# Patient Record
Sex: Male | Born: 1965 | Race: Black or African American | Hispanic: No | Marital: Single | Smoking: Former smoker
Health system: Southern US, Community
[De-identification: ages and names within clinical notes are randomized; demographics above are authoritative.]

## PROBLEM LIST (undated history)

## (undated) DIAGNOSIS — K219 Gastro-esophageal reflux disease without esophagitis: Secondary | ICD-10-CM

## (undated) DIAGNOSIS — M1990 Unspecified osteoarthritis, unspecified site: Secondary | ICD-10-CM

## (undated) DIAGNOSIS — A630 Anogenital (venereal) warts: Secondary | ICD-10-CM

## (undated) DIAGNOSIS — A159 Respiratory tuberculosis unspecified: Secondary | ICD-10-CM

## (undated) DIAGNOSIS — R202 Paresthesia of skin: Secondary | ICD-10-CM

## (undated) DIAGNOSIS — E785 Hyperlipidemia, unspecified: Secondary | ICD-10-CM

## (undated) DIAGNOSIS — I639 Cerebral infarction, unspecified: Secondary | ICD-10-CM

## (undated) DIAGNOSIS — I10 Essential (primary) hypertension: Secondary | ICD-10-CM

## (undated) DIAGNOSIS — I739 Peripheral vascular disease, unspecified: Secondary | ICD-10-CM

## (undated) DIAGNOSIS — R51 Headache: Secondary | ICD-10-CM

## (undated) DIAGNOSIS — R519 Headache, unspecified: Secondary | ICD-10-CM

## (undated) DIAGNOSIS — F17200 Nicotine dependence, unspecified, uncomplicated: Secondary | ICD-10-CM

## (undated) HISTORY — PX: UPPER GASTROINTESTINAL ENDOSCOPY: SHX188

## (undated) HISTORY — DX: Hyperlipidemia, unspecified: E78.5

## (undated) HISTORY — PX: COLONOSCOPY: SHX174

## (undated) HISTORY — DX: Nicotine dependence, unspecified, uncomplicated: F17.200

## (undated) HISTORY — DX: Essential (primary) hypertension: I10

## (undated) HISTORY — DX: Respiratory tuberculosis unspecified: A15.9

---

## 1993-07-19 DIAGNOSIS — A159 Respiratory tuberculosis unspecified: Secondary | ICD-10-CM

## 1993-07-19 HISTORY — DX: Respiratory tuberculosis unspecified: A15.9

## 2014-01-29 ENCOUNTER — Encounter: Payer: Self-pay | Admitting: Medical

## 2014-01-29 ENCOUNTER — Ambulatory Visit (INDEPENDENT_AMBULATORY_CARE_PROVIDER_SITE_OTHER): Payer: Managed Care, Other (non HMO) | Admitting: Medical

## 2014-01-29 ENCOUNTER — Telehealth: Payer: Self-pay | Admitting: Medical

## 2014-01-29 VITALS — BP 180/110 | HR 62 | Temp 98.3°F | Resp 12 | Ht 70.0 in | Wt 184.0 lb

## 2014-01-29 DIAGNOSIS — K4090 Unilateral inguinal hernia, without obstruction or gangrene, not specified as recurrent: Secondary | ICD-10-CM

## 2014-01-29 DIAGNOSIS — IMO0002 Reserved for concepts with insufficient information to code with codable children: Secondary | ICD-10-CM

## 2014-01-29 DIAGNOSIS — F17200 Nicotine dependence, unspecified, uncomplicated: Secondary | ICD-10-CM

## 2014-01-29 DIAGNOSIS — I10 Essential (primary) hypertension: Secondary | ICD-10-CM

## 2014-01-29 MED ORDER — LISINOPRIL-HYDROCHLOROTHIAZIDE 20-25 MG PO TABS: 1.0000 | ORAL_TABLET | Freq: Every day | ORAL | Status: DC

## 2014-01-29 NOTE — Patient Instructions (Signed)
Thank you for giving me the opportunity to serve you today.    Your diagnosis today includes: Encounter Diagnoses  Name Primary?  . Essential hypertension, benign Yes  . Tobacco use disorder   . Left inguinal hernia   . Leg strain      Specific recommendations today include:  Begin lisinopril HCT high blood pressure medication 1 tablet daily in the morning  I strongly recommend you find a way to quit tobacco.  I would recommend we start Wellbutrin medication to help decrease the appetite for tobacco  We will refer you to general surgery for left inguinal hernia  Regarding the leg muscle strain, try and not lift anything too heavy or do a lot of squatting for the next week or so  He may use over-the-counter ibuprofen or Aleve for pain and inflammation  He may use heat pad to the leg for the next few days   Recheck in 3-4 weeks for a physical    I have included other useful information below for your review.  YOU CAN QUIT SMOKING!  Talk to your medical provider about using medicines to help you quit. These include nicotine replacement gum, lozenges, or skin patches.  Consider calling 1-800-QUIT-NOW, a toll free 24/7 hotline with free counseling to help you quit.  If you are ready to quit smoking or are thinking about it, congratulations! You have chosen to help yourself be healthier and live longer! There are lots of different ways to quit smoking. Nicotine gum, nicotine patches, a nicotine inhaler, or nicotine nasal spray can help with physical craving. Hypnosis, support groups, and medicines help break the habit of smoking. TIPS TO GET OFF AND STAY OFF CIGARETTES  Learn to predict your moods. Do not let a bad situation be your excuse to have a cigarette. Some situations in your life might tempt you to have a cigarette.   Ask friends and co-workers not to smoke around you.   Make your home smoke-free.   Never have "just one" cigarette. It leads to wanting another and  another. Remind yourself of your decision to quit.   On a card, make a list of your reasons for not smoking. Read it at least the same number of times a day as you have a cigarette. Tell yourself everyday, "I do not want to smoke. I choose not to smoke."   Ask someone at home or work to help you with your plan to quit smoking.   Have something planned after you eat or have a cup of coffee. Take a walk or get other exercise to perk you up. This will help to keep you from overeating.   Try a relaxation exercise to calm you down and decrease your stress. Remember, you may be tense and nervous the first two weeks after you quit. This will pass.   Find new activities to keep your hands busy. Play with a pen, coin, or rubber band. Doodle or draw things on paper.   Brush your teeth right after eating. This will help cut down the craving for the taste of tobacco after meals. You can try mouthwash too.   Try gum, breath mints, or diet candy to keep something in your mouth.  IF YOU SMOKE AND WANT TO QUIT:  Do not stock up on cigarettes. Never buy a carton. Wait until one pack is finished before you buy another.   Never carry cigarettes with you at work or at home.   Keep cigarettes as far away  from you as possible. Leave them with someone else.   Never carry matches or a lighter with you.   Ask yourself, "Do I need this cigarette or is this just a reflex?"   Bet with someone that you can quit. Put cigarette money in a piggy bank every morning. If you smoke, you give up the money. If you do not smoke, by the end of the week, you keep the money.   Keep trying. It takes 21 days to change a habit!  Document Released: 05/01/2009 Document Revised: 03/17/2011 Document Reviewed: 05/01/2009 [facility] Patient Information 2012 [place].

## 2014-01-29 NOTE — Telephone Encounter (Signed)
Refer to general surgery for left inguinal hernia

## 2014-01-29 NOTE — Telephone Encounter (Signed)
Pt scheduled to see Dr. Rosendo Gros at [facility] on 02/18/14 at 3:15

## 2014-01-29 NOTE — Progress Notes (Addendum)
   Subjective:    Shane Valentine is a 48 y.o. male presenting on 01/29/2014 with hernia pain  Here as a new patient today. Last medical care 7-8 years ago.  Thinks he has a hernia, left inguinal.   Has a bulge and pain in left groin x 2 wk.  Date of injury assumed to be 01/15/14.  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  Hurts mostly when bending or turning a certain way.  No prior hernia.  Denies any specific injury of recent.  HTN - last checked 80mo, high to borderline.  Diagnosed with HTN over 10 years ago.  Last medication - can't recall the name, but been years since HTN medications.  Similar was on Nexium in the past for GERD.  This flares up from time to time.  Uses Tums.   Smoker - 1ppd x 23 years.    Had TB in 1995, treated x 6 mo of therapy through health dept.   No other aggravating or relieving factors.  No other complaint.  Review of Systems ROS as in subjective      Objective:    BP 180/110  Pulse 62  Temp(Src) 98.3 F (36.8 C)  Resp 12  Ht 5\' 10"  (1.778 m)  Wt 184 lb (83.462 kg)  BMI 26.40 kg/m2  General appearance: alert, no distress, WD/WN, AA male Neck: supple, no lymphadenopathy, no thyromegaly, no masses, no bruits Heart: RRR, normal S1, S2, no murmurs Lungs: CTA bilaterally, no wheezes, rhonchi, or rales Abdomen: +bs, soft, non tender, non distended, no masses, no hepatomegaly, no splenomegaly Pulses: 2+ symmetric, upper and lower extremities, normal cap refill Ext: no edema GU: Tender bulge in the left inguinal area, suggestive of small direct but tender inguinal hernia, worse bulge with Valsalva. Otherwise no obvious hernia or mass otherwise circumcised, normal male genitalia Musculoskeletal: Mild tenderness to left medial upper thigh, mild pain with left leg abduction otherwise lower extremity nontender normal range of motion     Assessment: Encounter  Diagnoses  Name Primary?  . Essential hypertension, benign Yes  . Tobacco use disorder   . Left inguinal hernia   . Leg strain      Plan: Hypertension-diagnosed 10 years ago, no medication for years, discussed risk of high blood pressure, begin back on medication, lisinopril HCT. Discussed risk and benefits of medication recheck in 2-4 weeks  Tobacco use disorder-discussed risk of tobacco, advise he quit tobacco, he is trying to cutback, has used patches some.  He will consider Wellbutrin  Left inguinal hernia-symptomatic, referral to general surgery  Leg strain-discuss diagnosis symptoms and treatment, followup if not improving    Shane Valentine was seen today for hernia pain.  Diagnoses and associated orders for this visit:  Essential hypertension, benign  Tobacco use disorder  Left inguinal hernia  Leg strain  Other Orders - lisinopril-hydrochlorothiazide (PRINZIDE,ZESTORETIC) 20-25 MG per tablet; Take 1 tablet by mouth daily.    Return in about 1 month (around 03/01/2014), or for CPX.

## 2014-02-13 ENCOUNTER — Telehealth: Payer: Self-pay | Admitting: Internal Medicine

## 2014-02-13 NOTE — Telephone Encounter (Signed)
Faxed over medical records to [facility] [place] [facility] @ [phone]

## 2014-02-18 ENCOUNTER — Encounter (INDEPENDENT_AMBULATORY_CARE_PROVIDER_SITE_OTHER): Payer: Self-pay | Admitting: General Surgery

## 2014-02-18 ENCOUNTER — Ambulatory Visit (INDEPENDENT_AMBULATORY_CARE_PROVIDER_SITE_OTHER): Payer: Managed Care, Other (non HMO) | Admitting: General Surgery

## 2014-02-18 VITALS — BP 154/100 | HR 50 | Temp 97.9°F | Resp 16 | Ht 70.0 in | Wt 178.4 lb

## 2014-02-18 DIAGNOSIS — K4090 Unilateral inguinal hernia, without obstruction or gangrene, not specified as recurrent: Secondary | ICD-10-CM

## 2014-02-18 NOTE — Progress Notes (Signed)
Patient ID: Shane Valentine, male   DOB: [birth year], 48 y.o.   MRN: [id number]  No chief complaint on file.   HPI Shane Valentine is a 48 y.o. male.  The patient is a 48 year old male who is referred by Dr. Glade Valentine  For evaluation of a left inguinal hernia. The patient states that this arose approximately 1-2 months ago. He states he has pain in the left inguinal area. He states he tried returning to work however secondary to pain could not tolerate the pain. The patient works as a [employer] social research officer. HPI  No past medical history on file.  No past surgical history on file.  No family history on file.  Social History History  Substance Use Topics  . Smoking status: Current Every Day Smoker  . Smokeless tobacco: Not on file  . Alcohol Use: Not on file    No Known Allergies  Current Outpatient Prescriptions  Medication Sig Dispense Refill  . lisinopril-hydrochlorothiazide (PRINZIDE,ZESTORETIC) 20-25 MG per tablet Take 1 tablet by mouth daily.  30 tablet  2   No current facility-administered medications for this visit.    Review of Systems Review of Systems  Constitutional: Negative.   HENT: Negative.   Eyes: Negative.   Respiratory: Negative.   Cardiovascular: Negative.   Gastrointestinal: Negative.   Endocrine: Negative.   Neurological: Negative.     Blood pressure 154/100, pulse 50, temperature 97.9 F (36.6 C), temperature source Temporal, resp. rate 16, height 5\' 10"  (1.778 m), weight 178 lb 6.4 oz (80.922 kg).  Physical Exam Physical Exam  Constitutional: He is oriented to person, place, and time. He appears well-developed and well-nourished.  HENT:  Head: Normocephalic and atraumatic.  Eyes: Conjunctivae and EOM are normal. Pupils are equal, round, and reactive to light.  Neck: Normal range of motion. Neck supple.  Cardiovascular: Normal rate, regular rhythm and normal heart sounds.   Pulmonary/Chest: Effort normal and breath sounds normal.  Abdominal: Bowel sounds are  normal. A hernia is present. Hernia confirmed positive in the left inguinal area. Hernia confirmed negative in the right inguinal area.  Musculoskeletal: Normal range of motion.  Neurological: He is alert and oriented to person, place, and time.  Skin: Skin is warm and dry.    Data Reviewed none  Assessment    48 year old male with a left likely direct inguinal hernia     Plan    1. The patient like to proceed to the operating room for laparoscopic left inguinal hernia inguinal hernia repair with Mesh. 2.All risks and benefits were discussed with the patient, to generally include infection, bleeding, damage to surrounding structures, acute and chronic nerve pain, and recurrence. Alternatives were offered and described.  All questions were answered and the patient voiced understanding of the procedure and wishes to proceed at this point.         Rosario Jacks., Guy Toney 02/18/2014, 3:33 PM

## 2014-02-20 ENCOUNTER — Encounter: Payer: Self-pay | Admitting: Medical

## 2014-02-20 ENCOUNTER — Telehealth: Payer: Self-pay | Admitting: Medical

## 2014-02-20 ENCOUNTER — Ambulatory Visit (INDEPENDENT_AMBULATORY_CARE_PROVIDER_SITE_OTHER): Payer: Managed Care, Other (non HMO) | Admitting: Medical

## 2014-02-20 ENCOUNTER — Other Ambulatory Visit: Payer: Self-pay | Admitting: Medical

## 2014-02-20 VITALS — BP 160/100 | HR 88 | Temp 98.5°F | Resp 16 | Wt 177.0 lb

## 2014-02-20 DIAGNOSIS — L259 Unspecified contact dermatitis, unspecified cause: Secondary | ICD-10-CM

## 2014-02-20 DIAGNOSIS — I10 Essential (primary) hypertension: Secondary | ICD-10-CM

## 2014-02-20 DIAGNOSIS — M6740 Ganglion, unspecified site: Secondary | ICD-10-CM

## 2014-02-20 DIAGNOSIS — R9431 Abnormal electrocardiogram [ECG] [EKG]: Secondary | ICD-10-CM

## 2014-02-20 DIAGNOSIS — F17200 Nicotine dependence, unspecified, uncomplicated: Secondary | ICD-10-CM

## 2014-02-20 LAB — CBC
HEMATOCRIT: 40.6 % (ref 39.0–52.0)
HEMOGLOBIN: 13.9 g/dL (ref 13.0–17.0)
MCH: 31.6 pg (ref 26.0–34.0)
MCHC: 34.2 g/dL (ref 30.0–36.0)
MCV: 92.3 fL (ref 78.0–100.0)
Platelets: 199 10*3/uL (ref 150–400)
RBC: 4.4 MIL/uL (ref 4.22–5.81)
RDW: 13.4 % (ref 11.5–15.5)
WBC: 9.6 10*3/uL (ref 4.0–10.5)

## 2014-02-20 LAB — COMPREHENSIVE METABOLIC PANEL
ALBUMIN: 4.2 g/dL (ref 3.5–5.2)
ALT: 12 U/L (ref 0–53)
AST: 13 U/L (ref 0–37)
Alkaline Phosphatase: 81 U/L (ref 39–117)
BUN: 15 mg/dL (ref 6–23)
CALCIUM: 8.9 mg/dL (ref 8.4–10.5)
CHLORIDE: 106 meq/L (ref 96–112)
CO2: 25 mEq/L (ref 19–32)
Creat: 1.17 mg/dL (ref 0.50–1.35)
GLUCOSE: 98 mg/dL (ref 70–99)
POTASSIUM: 3.7 meq/L (ref 3.5–5.3)
Sodium: 141 mEq/L (ref 135–145)
Total Bilirubin: 0.4 mg/dL (ref 0.2–1.2)
Total Protein: 6.5 g/dL (ref 6.0–8.3)

## 2014-02-20 LAB — TSH: TSH: 0.352 u[IU]/mL (ref 0.350–4.500)

## 2014-02-20 MED ORDER — NICOTINE 21 MG/24HR TD PT24: 21.0000 mg | MEDICATED_PATCH | Freq: Every day | TRANSDERMAL | Status: DC

## 2014-02-20 NOTE — Telephone Encounter (Signed)
Refer to Dr. Wynonia Lawman for abnormal EKG, eval, HTN, and he prefers ASAP.  He is having hernia surgery soon

## 2014-02-20 NOTE — Progress Notes (Signed)
Subjective:    Shane Valentine is a 48 y.o. male presenting on 02/20/2014 with Follow-up  I last saw him 01/29/14 as a new patient.  At that time we started him back on BP medication Lisinopril HCT.  He is compliant with medication, been on this 3 wk now.  Prior to me seeing him in July, he  last checked BP 37mo earlier which was high to borderline.  Diagnosed with HTN over 10 years ago.  Last medication - can't recall the name, but been years since HTN medications until July.   Smoker - 1ppd x 23 years.    He has small area of poison ivy exposure right elbow  He has a bump at the base of the right thumb to look at.   Review of Systems ROS as in subjective      Objective:    BP 160/100  Pulse 88  Temp(Src) 98.5 F (36.9 C) (Tympanic)  Resp 16  Wt 177 lb (80.287 kg)  General appearance: alert, no distress, WD/WN, AA male Neck: supple, no lymphadenopathy, no thyromegaly, no masses, no bruits Heart: RRR, normal S1, S2, no murmurs Lungs: CTA bilaterally, no wheezes, rhonchi, or rales Abdomen: +bs, soft, non tender, non distended, no masses, no hepatomegaly, no splenomegaly Pulses: 2+ symmetric, upper and lower extremities, normal cap refill Ext: no edema Skin: right hand adjacent to base of thumb in subcutaneously tissue with 106mm round mobiole cystic lesion, likely ganglion cyst.  Patch of erythematous raised urticarial rash on right medial elbow region suggestive of contact dermatitis   Adult ECG Report  Indication: HTN  Rate: 79 bpm  Rhythm: normal sinus rhythm  QRS Axis: 83 degrees  PR Interval: 160ms  QRS Duration: 66ms  QTc: 475ms  Conduction Disturbances: P wave changes suggestive of atrial enlargement, possible borderline LVH  Other Abnormalities: T wave inversion II, III, aVF  Patient's cardiac risk factors are: hypertension, male gender and smoking/ tobacco exposure.  EKG comparison: none  Narrative Interpretation: T wave inversion, possible ischemia, possible heart  enlargement        Assessment: Encounter Diagnoses  Name Primary?  . Essential hypertension, benign Yes  . Tobacco use disorder   . Ganglion cyst   . Contact dermatitis   . Nonspecific abnormal electrocardiogram (ECG) (EKG)      Plan: Hypertension - only been on Lisinopril HCT 20/25mg  3 wk.   Will likely need to add medication.   C/t current medication, referral to cardiology.    Of note diagnosed 10 years ago, no medication for years, discussed risk of high blood pressure.  Abnormal EKG - given longstanding HTN without medication, and given EKG changes, likely has enlargement, and need to further eval for possible ischemia, T wave inversions on EKG.  Tobacco use disorder-discussed risk of tobacco, advise he quit tobacco, he will begin Nicotine patches.   Left inguinal hernia - we referred to surgery, and surgery date is begin planned.  Contact dermatitis - discussed treatment, followup, and begin OTC hydrocortisone cream  Ganglion cyst - watch and wait approach, asymptotic currently  Shane Valentine was seen today for follow-up.  Diagnoses and associated orders for this visit:  Essential hypertension, benign - EKG 12-Lead - PR ELECTROCARDIOGRAM, COMPLETE - Comprehensive metabolic panel - CBC - TSH  Tobacco use disorder - EKG 12-Lead - PR ELECTROCARDIOGRAM, COMPLETE - Comprehensive metabolic panel - CBC - TSH  Ganglion cyst  Contact dermatitis  Nonspecific abnormal electrocardiogram (ECG) (EKG)  Other Orders - nicotine (NICODERM CQ) 21  mg/24hr patch; Place 1 patch (21 mg total) onto the skin daily.    Return pending labs.

## 2014-02-21 ENCOUNTER — Other Ambulatory Visit: Payer: Self-pay | Admitting: Medical

## 2014-02-21 DIAGNOSIS — R7989 Other specified abnormal findings of blood chemistry: Secondary | ICD-10-CM

## 2014-02-21 NOTE — Progress Notes (Signed)
Shane Valentine, and he saw Dr Einar Gip today.

## 2014-02-21 NOTE — Telephone Encounter (Signed)
Called patient to make cardiology appointment and he was currently at Dr. Irven Shelling office for evaluation of cardiac issues.

## 2014-02-22 ENCOUNTER — Other Ambulatory Visit: Payer: Self-pay | Admitting: Medical

## 2014-02-22 DIAGNOSIS — F17200 Nicotine dependence, unspecified, uncomplicated: Secondary | ICD-10-CM

## 2014-02-22 DIAGNOSIS — I10 Essential (primary) hypertension: Secondary | ICD-10-CM

## 2014-02-22 DIAGNOSIS — R7989 Other specified abnormal findings of blood chemistry: Secondary | ICD-10-CM

## 2014-02-22 LAB — T4: T4 TOTAL: 4.6 ug/dL — AB (ref 5.0–12.5)

## 2014-02-26 ENCOUNTER — Telehealth: Payer: Self-pay | Admitting: Internal Medicine

## 2014-02-26 NOTE — Telephone Encounter (Signed)
Faxed over medical records to [employer] @ [phone]

## 2014-02-27 ENCOUNTER — Other Ambulatory Visit: Payer: Self-pay | Admitting: Medical

## 2014-02-27 ENCOUNTER — Encounter: Payer: Self-pay | Admitting: Medical

## 2014-02-27 ENCOUNTER — Ambulatory Visit (INDEPENDENT_AMBULATORY_CARE_PROVIDER_SITE_OTHER): Payer: Managed Care, Other (non HMO) | Admitting: Medical

## 2014-02-27 VITALS — BP 160/100 | HR 68 | Temp 97.6°F | Resp 16 | Wt 172.0 lb

## 2014-02-27 DIAGNOSIS — M7710 Lateral epicondylitis, unspecified elbow: Secondary | ICD-10-CM

## 2014-02-27 DIAGNOSIS — R7989 Other specified abnormal findings of blood chemistry: Secondary | ICD-10-CM

## 2014-02-27 DIAGNOSIS — M7712 Lateral epicondylitis, left elbow: Secondary | ICD-10-CM

## 2014-02-27 DIAGNOSIS — R946 Abnormal results of thyroid function studies: Secondary | ICD-10-CM

## 2014-02-27 DIAGNOSIS — F17200 Nicotine dependence, unspecified, uncomplicated: Secondary | ICD-10-CM

## 2014-02-27 DIAGNOSIS — K4090 Unilateral inguinal hernia, without obstruction or gangrene, not specified as recurrent: Secondary | ICD-10-CM

## 2014-02-27 DIAGNOSIS — R9431 Abnormal electrocardiogram [ECG] [EKG]: Secondary | ICD-10-CM

## 2014-02-27 DIAGNOSIS — I10 Essential (primary) hypertension: Secondary | ICD-10-CM

## 2014-02-27 MED ORDER — TRAMADOL HCL 50 MG PO TABS: 50.0000 mg | ORAL_TABLET | Freq: Two times a day (BID) | ORAL | Status: DC | PRN

## 2014-02-27 MED ORDER — LISINOPRIL-HYDROCHLOROTHIAZIDE 20-25 MG PO TABS: 1.0000 | ORAL_TABLET | Freq: Every day | ORAL | Status: DC

## 2014-02-27 NOTE — Patient Instructions (Signed)
  Thank you for giving me the opportunity to serve you today.    Your diagnosis today includes: Encounter Diagnoses  Name Primary?  . Essential hypertension, benign Yes  . Smoker   . Lateral epicondylitis, left   . Nonspecific abnormal electrocardiogram (ECG) (EKG)   . Abnormal thyroid blood test   . Left inguinal hernia      Specific recommendations today include:  Go for chest x-ray  Followup with cardiology as planned  Return after the cardiology visit for repeat thyroid test  Continue efforts to stop smoking  Use ice, rest, ibuprofen for the arm pain/tennis elbow  You may use Ultram pain medicine as needed for hernia pain  Return pending CXR.

## 2014-02-27 NOTE — Progress Notes (Signed)
Subjective: Since his last visit he has seen cardiology, had stress test, echocardiogram, and apparently there were some abnormalities. He has followup with Dr. Einar Gip the week after next.  He has begun Wellbutrin to help quit smoking per cardiology.  He had a lipid panel done his cardiology visit but I do not have copies of that. He is compliant with his blood pressure medication needs refill.  Unfortunately his surgery has been delayed until cardiac clearance  He reports left elbow pain, is left-handed, does repetitive work.    He is here to discuss his recent lab tests  Continues to have pain in the left inguinal hernia  No other complaint  Review of systems as in subjective  Objective: General: Well-developed, well-nourished, no acute distress Heart: RRR, normal S1-S2 no murmurs Lungs clear Neck: No obvious thyromegaly or thyroid nodules, no mass, supple, nontender Abdomen: Nontender no mass no organomegaly No obvious lymphadenopathy supraclavicular MSK: Left arm tender over lateral epicondyle and medial epicondyle, no swelling, deformity, normal range of motion Extremities no edema Normal pulses throughout  Assessment: Encounter Diagnoses  Name Primary?  . Essential hypertension, benign Yes  . Smoker   . Lateral epicondylitis, left   . Nonspecific abnormal electrocardiogram (ECG) (EKG)   . Abnormal thyroid blood test   . Left inguinal hernia    Plan: Hypertension-continue current medication for cardiac meds may need to add on beta blocker if his blood pressures continued to be elevated. He will check his blood pressures at home and let us know some readings in a week or 2  Smoker-continue efforts to stop tobacco, continue Wellbutrin  Lateral epicondylitis-advise relative rest, ice, ibuprofen over-the-counter when necessary  Abnormal EKG-followup with cardiology, I assume he will be having a catheterization given the abnormal stress test and echo although I do not have  those records  Abnormal thyroid test-low normal TSH, low T4.  He will go for chest x-ray.  Plan to repeat thyroid test after the next cardiology visit. If still abnormal at that time we'll refer to endocrinology  Left inguinal hernia-surgery postponed pending cardiac clearance, Ultram when necessary for pain

## 2014-02-27 NOTE — Addendum Note (Signed)
Addended by: Carlena Hurl on: 02/27/2014 12:23 PM   Modules accepted: Orders

## 2014-02-28 ENCOUNTER — Ambulatory Visit
Admission: RE | Admit: 2014-02-28 | Discharge: 2014-02-28 | Disposition: A | Discharge status: Home / Self Care | Payer: Managed Care, Other (non HMO) | Source: Ambulatory Visit | Attending: Medical | Admitting: Medical

## 2014-02-28 DIAGNOSIS — I10 Essential (primary) hypertension: Secondary | ICD-10-CM

## 2014-02-28 DIAGNOSIS — R7989 Other specified abnormal findings of blood chemistry: Secondary | ICD-10-CM

## 2014-02-28 DIAGNOSIS — F17200 Nicotine dependence, unspecified, uncomplicated: Secondary | ICD-10-CM

## 2014-03-11 ENCOUNTER — Other Ambulatory Visit: Payer: Managed Care, Other (non HMO)

## 2014-03-11 ENCOUNTER — Other Ambulatory Visit: Payer: Self-pay | Admitting: Medical

## 2014-03-11 DIAGNOSIS — R7989 Other specified abnormal findings of blood chemistry: Secondary | ICD-10-CM

## 2014-03-11 DIAGNOSIS — I10 Essential (primary) hypertension: Secondary | ICD-10-CM

## 2014-03-11 LAB — LIPID PANEL
CHOL/HDL RATIO: 4.5 ratio
Cholesterol: 194 mg/dL (ref 0–200)
HDL: 43 mg/dL (ref >39)
LDL Cholesterol: 133 mg/dL — ABNORMAL HIGH (ref 0–99)
TRIGLYCERIDES: 91 mg/dL (ref <150)
VLDL: 18 mg/dL (ref 0–40)

## 2014-03-11 LAB — T4, FREE: Free T4: 0.98 ng/dL (ref 0.80–1.80)

## 2014-03-12 ENCOUNTER — Other Ambulatory Visit: Payer: Self-pay | Admitting: Medical

## 2014-03-12 ENCOUNTER — Telehealth: Payer: Self-pay | Admitting: Medical

## 2014-03-12 DIAGNOSIS — R7989 Other specified abnormal findings of blood chemistry: Secondary | ICD-10-CM

## 2014-03-12 LAB — TSH: TSH: 1.098 u[IU]/mL (ref 0.350–4.500)

## 2014-03-12 LAB — PROLACTIN: PROLACTIN: 6.5 ng/mL (ref 2.1–17.1)

## 2014-03-12 LAB — T3: T3, Total: 84 ng/dL (ref 80.0–204.0)

## 2014-03-12 LAB — FSH/LH
FSH: 22.2 m[IU]/mL — ABNORMAL HIGH (ref 1.4–18.1)
LH: 10.4 m[IU]/mL — AB (ref 1.5–9.3)

## 2014-03-12 MED ORDER — ATORVASTATIN CALCIUM 20 MG PO TABS: 20.0000 mg | ORAL_TABLET | Freq: Every day | ORAL | Status: DC

## 2014-03-12 NOTE — Telephone Encounter (Signed)
Records recv'd from Dr. Einar Gip, given to [facility] for review.  Daughter asked that you call ASAP and let them know about the surgery clearance

## 2014-03-12 NOTE — Telephone Encounter (Signed)
Patient is aware of Dorothea Ogle PAc message and he will contact the surgeon to schedule his surgery. CLS

## 2014-03-12 NOTE — Telephone Encounter (Signed)
She called to see if we had given pt surgical clearance, I advised per notes waiting on cardiac eval.  She states pt had already been to Dr. Einar Gip t# [phone] before his appt here and was cleared on cardiac standpoint. She will contact them & ask for records

## 2014-03-12 NOTE — Telephone Encounter (Signed)
Pull the recent cardiac records that were faxed over

## 2014-03-12 NOTE — Telephone Encounter (Signed)
I have looked over Dr. Irven Shelling notes and he has cleared him to have surgery.  I never received anything from the surgeon, so as far as I am concerned, he can proceed to the surgery.   I hope his BPs are looking much better.   We will call with the lab results that are pending.    Andria Frames - may want to call his surgeon to verify they got Dr. Irven Shelling notes and clearance for surgery.

## 2014-03-26 ENCOUNTER — Encounter (HOSPITAL_COMMUNITY): Payer: Self-pay | Admitting: Pharmacy Technician

## 2014-03-27 ENCOUNTER — Telehealth: Payer: Self-pay | Admitting: Internal Medicine

## 2014-03-27 NOTE — Telephone Encounter (Signed)
Faxed medical records on 03/18/14 to [facility] for records 2013-present, ALSO Faxed records 03/26/14 to [facility] for Records for August 2015-present

## 2014-03-28 ENCOUNTER — Encounter (HOSPITAL_COMMUNITY): Payer: Self-pay | Admitting: *Deleted

## 2014-03-28 MED ORDER — CHLORHEXIDINE GLUCONATE 4 % EX LIQD
1.0000 "application " | Freq: Once | CUTANEOUS | Status: DC
  Filled 2014-03-28: qty 15

## 2014-03-28 MED ORDER — CEFAZOLIN SODIUM-DEXTROSE 2-3 GM-% IV SOLR
2.0000 g | INTRAVENOUS | Status: AC
  Administered 2014-03-29: 2 g via INTRAVENOUS
  Filled 2014-03-28: qty 50

## 2014-03-29 ENCOUNTER — Encounter (HOSPITAL_COMMUNITY): Payer: Managed Care, Other (non HMO) | Admitting: Certified Registered"

## 2014-03-29 ENCOUNTER — Ambulatory Visit (HOSPITAL_COMMUNITY): Payer: Managed Care, Other (non HMO) | Admitting: Certified Registered"

## 2014-03-29 ENCOUNTER — Encounter (HOSPITAL_COMMUNITY): Payer: Self-pay

## 2014-03-29 ENCOUNTER — Ambulatory Visit (HOSPITAL_COMMUNITY)
Admission: RE | Admit: 2014-03-29 | Discharge: 2014-03-29 | Disposition: A | Discharge status: Home / Self Care | Payer: Managed Care, Other (non HMO) | Source: Ambulatory Visit | Attending: General Surgery | Admitting: General Surgery

## 2014-03-29 ENCOUNTER — Encounter (HOSPITAL_COMMUNITY)
Admission: RE | Disposition: A | Discharge status: Home / Self Care | Payer: Self-pay | Source: Ambulatory Visit | Attending: General Surgery

## 2014-03-29 DIAGNOSIS — F17200 Nicotine dependence, unspecified, uncomplicated: Secondary | ICD-10-CM | POA: Insufficient documentation

## 2014-03-29 DIAGNOSIS — I10 Essential (primary) hypertension: Secondary | ICD-10-CM | POA: Insufficient documentation

## 2014-03-29 DIAGNOSIS — K219 Gastro-esophageal reflux disease without esophagitis: Secondary | ICD-10-CM | POA: Insufficient documentation

## 2014-03-29 DIAGNOSIS — K4090 Unilateral inguinal hernia, without obstruction or gangrene, not specified as recurrent: Secondary | ICD-10-CM | POA: Diagnosis present

## 2014-03-29 HISTORY — PX: INSERTION OF MESH: SHX5868

## 2014-03-29 HISTORY — DX: Gastro-esophageal reflux disease without esophagitis: K21.9

## 2014-03-29 HISTORY — PX: INGUINAL HERNIA REPAIR: SHX194

## 2014-03-29 LAB — CBC
HEMATOCRIT: 40.7 % (ref 39.0–52.0)
HEMOGLOBIN: 13.6 g/dL (ref 13.0–17.0)
MCH: 31.3 pg (ref 26.0–34.0)
MCHC: 33.4 g/dL (ref 30.0–36.0)
MCV: 93.6 fL (ref 78.0–100.0)
Platelets: 219 10*3/uL (ref 150–400)
RBC: 4.35 MIL/uL (ref 4.22–5.81)
RDW: 12.9 % (ref 11.5–15.5)
WBC: 10.4 10*3/uL (ref 4.0–10.5)

## 2014-03-29 LAB — BASIC METABOLIC PANEL
ANION GAP: 11 (ref 5–15)
BUN: 17 mg/dL (ref 6–23)
CHLORIDE: 101 meq/L (ref 96–112)
CO2: 27 meq/L (ref 19–32)
Calcium: 9.3 mg/dL (ref 8.4–10.5)
Creatinine, Ser: 1.01 mg/dL (ref 0.50–1.35)
GFR calc Af Amer: >90 mL/min (ref >90)
GFR calc non Af Amer: 86 mL/min — ABNORMAL LOW (ref >90)
Glucose, Bld: 104 mg/dL — ABNORMAL HIGH (ref 70–99)
POTASSIUM: 3.8 meq/L (ref 3.7–5.3)
Sodium: 139 mEq/L (ref 137–147)

## 2014-03-29 SURGERY — REPAIR, HERNIA, INGUINAL, LAPAROSCOPIC
Anesthesia: General | Laterality: Left

## 2014-03-29 MED ORDER — NEOSTIGMINE METHYLSULFATE 10 MG/10ML IV SOLN
INTRAVENOUS | Status: DC | PRN
  Administered 2014-03-29: 4 mg via INTRAVENOUS

## 2014-03-29 MED ORDER — HYDROMORPHONE HCL PF 1 MG/ML IJ SOLN
INTRAMUSCULAR | Status: AC
  Administered 2014-03-29: 0.5 mg via INTRAVENOUS
  Filled 2014-03-29: qty 1

## 2014-03-29 MED ORDER — SODIUM CHLORIDE 0.9 % IR SOLN
Status: DC | PRN
  Administered 2014-03-29: 1000 mL

## 2014-03-29 MED ORDER — OXYCODONE HCL 5 MG PO TABS
ORAL_TABLET | ORAL | Status: AC
  Filled 2014-03-29: qty 1

## 2014-03-29 MED ORDER — HYDROCHLOROTHIAZIDE 25 MG PO TABS
25.0000 mg | ORAL_TABLET | Freq: Once | ORAL | Status: AC
  Administered 2014-03-29: 25 mg via ORAL
  Filled 2014-03-29: qty 1

## 2014-03-29 MED ORDER — HYDROMORPHONE HCL PF 1 MG/ML IJ SOLN
0.2500 mg | INTRAMUSCULAR | Status: DC | PRN
  Administered 2014-03-29 (×2): 0.5 mg via INTRAVENOUS

## 2014-03-29 MED ORDER — OXYCODONE-ACETAMINOPHEN 5-325 MG PO TABS: 1.0000 | ORAL_TABLET | ORAL | Status: DC | PRN

## 2014-03-29 MED ORDER — PROPOFOL 10 MG/ML IV BOLUS
INTRAVENOUS | Status: DC | PRN
  Administered 2014-03-29: 200 mg via INTRAVENOUS

## 2014-03-29 MED ORDER — LISINOPRIL 20 MG PO TABS
20.0000 mg | ORAL_TABLET | Freq: Once | ORAL | Status: AC
  Administered 2014-03-29: 20 mg via ORAL
  Filled 2014-03-29: qty 1

## 2014-03-29 MED ORDER — OXYCODONE HCL 5 MG PO TABS
5.0000 mg | ORAL_TABLET | ORAL | Status: DC | PRN
  Administered 2014-03-29: 5 mg via ORAL

## 2014-03-29 MED ORDER — FENTANYL CITRATE 0.05 MG/ML IJ SOLN
INTRAMUSCULAR | Status: DC | PRN
  Administered 2014-03-29: 50 ug via INTRAVENOUS
  Administered 2014-03-29: 150 ug via INTRAVENOUS

## 2014-03-29 MED ORDER — PROPOFOL 10 MG/ML IV BOLUS
INTRAVENOUS | Status: AC
  Filled 2014-03-29: qty 20

## 2014-03-29 MED ORDER — LACTATED RINGERS IV SOLN: INTRAVENOUS | Status: DC

## 2014-03-29 MED ORDER — LIDOCAINE HCL (CARDIAC) 20 MG/ML IV SOLN
INTRAVENOUS | Status: AC
  Filled 2014-03-29: qty 15

## 2014-03-29 MED ORDER — ROCURONIUM BROMIDE 100 MG/10ML IV SOLN
INTRAVENOUS | Status: DC | PRN
  Administered 2014-03-29: 40 mg via INTRAVENOUS

## 2014-03-29 MED ORDER — GLYCOPYRROLATE 0.2 MG/ML IJ SOLN
INTRAMUSCULAR | Status: DC | PRN
  Administered 2014-03-29: .8 mg via INTRAVENOUS

## 2014-03-29 MED ORDER — ONDANSETRON HCL 4 MG/2ML IJ SOLN
INTRAMUSCULAR | Status: AC
  Filled 2014-03-29: qty 2

## 2014-03-29 MED ORDER — MIDAZOLAM HCL 2 MG/2ML IJ SOLN
INTRAMUSCULAR | Status: AC
  Filled 2014-03-29: qty 2

## 2014-03-29 MED ORDER — MIDAZOLAM HCL 5 MG/5ML IJ SOLN
INTRAMUSCULAR | Status: DC | PRN
  Administered 2014-03-29: 2 mg via INTRAVENOUS

## 2014-03-29 MED ORDER — FENTANYL CITRATE 0.05 MG/ML IJ SOLN
INTRAMUSCULAR | Status: AC
  Filled 2014-03-29: qty 5

## 2014-03-29 MED ORDER — BUPIVACAINE HCL 0.25 % IJ SOLN
INTRAMUSCULAR | Status: DC | PRN
  Administered 2014-03-29: 30 mL

## 2014-03-29 MED ORDER — LACTATED RINGERS IV SOLN
INTRAVENOUS | Status: DC | PRN
  Administered 2014-03-29 (×2): via INTRAVENOUS

## 2014-03-29 MED ORDER — BUPIVACAINE HCL (PF) 0.25 % IJ SOLN
INTRAMUSCULAR | Status: AC
  Filled 2014-03-29: qty 30

## 2014-03-29 MED ORDER — ARTIFICIAL TEARS OP OINT
TOPICAL_OINTMENT | OPHTHALMIC | Status: DC | PRN
  Administered 2014-03-29: 1 via OPHTHALMIC

## 2014-03-29 MED ORDER — ONDANSETRON HCL 4 MG/2ML IJ SOLN
INTRAMUSCULAR | Status: DC | PRN
  Administered 2014-03-29: 4 mg via INTRAVENOUS

## 2014-03-29 MED ORDER — ROCURONIUM BROMIDE 50 MG/5ML IV SOLN
INTRAVENOUS | Status: AC
  Filled 2014-03-29: qty 1

## 2014-03-29 MED ORDER — LIDOCAINE HCL (CARDIAC) 20 MG/ML IV SOLN
INTRAVENOUS | Status: DC | PRN
  Administered 2014-03-29: 100 mg via INTRAVENOUS

## 2014-03-29 SURGICAL SUPPLY — 41 items
APPLIER CLIP 5 13 M/L LIGAMAX5 (MISCELLANEOUS)
BENZOIN TINCTURE PRP APPL 2/3 (GAUZE/BANDAGES/DRESSINGS) ×2 IMPLANT
CANISTER SUCTION 2500CC (MISCELLANEOUS) IMPLANT
CHLORAPREP W/TINT 26ML (MISCELLANEOUS) ×2 IMPLANT
CLIP APPLIE 5 13 M/L LIGAMAX5 (MISCELLANEOUS) IMPLANT
COVER SURGICAL LIGHT HANDLE (MISCELLANEOUS) ×2 IMPLANT
DISSECT BALLN SPACEMKR + OVL (BALLOONS)
DISSECTOR BALLN SPACEMKR + OVL (BALLOONS) IMPLANT
DISSECTOR BLUNT TIP ENDO 5MM (MISCELLANEOUS) IMPLANT
DRAPE UTILITY 15X26 W/TAPE STR (DRAPE) ×4 IMPLANT
ELECT REM PT RETURN 9FT ADLT (ELECTROSURGICAL) ×2
ELECTRODE REM PT RTRN 9FT ADLT (ELECTROSURGICAL) ×1 IMPLANT
GAUZE SPONGE 2X2 8PLY STRL LF (GAUZE/BANDAGES/DRESSINGS) ×1 IMPLANT
GLOVE BIO SURGEON STRL SZ7.5 (GLOVE) ×2 IMPLANT
GOWN STRL REUS W/ TWL LRG LVL3 (GOWN DISPOSABLE) ×2 IMPLANT
GOWN STRL REUS W/ TWL XL LVL3 (GOWN DISPOSABLE) ×1 IMPLANT
GOWN STRL REUS W/TWL LRG LVL3 (GOWN DISPOSABLE) ×2
GOWN STRL REUS W/TWL XL LVL3 (GOWN DISPOSABLE) ×1
KIT BASIN OR (CUSTOM PROCEDURE TRAY) ×2 IMPLANT
KIT ROOM TURNOVER OR (KITS) ×2 IMPLANT
MESH 3DMAX 4X6 LT LRG (Mesh General) ×2 IMPLANT
NEEDLE INSUFFLATION 14GA 120MM (NEEDLE) ×2 IMPLANT
NS IRRIG 1000ML POUR BTL (IV SOLUTION) ×2 IMPLANT
PAD ARMBOARD 7.5X6 YLW CONV (MISCELLANEOUS) ×4 IMPLANT
RELOAD STAPLE HERNIA 4.0 BLUE (INSTRUMENTS) ×2 IMPLANT
RELOAD STAPLE HERNIA 4.8 BLK (STAPLE) IMPLANT
SCISSORS LAP 5X35 DISP (ENDOMECHANICALS) ×2 IMPLANT
SET IRRIG TUBING LAPAROSCOPIC (IRRIGATION / IRRIGATOR) IMPLANT
SET TROCAR LAP APPLE-HUNT 5MM (ENDOMECHANICALS) ×2 IMPLANT
SPONGE GAUZE 2X2 STER 10/PKG (GAUZE/BANDAGES/DRESSINGS) ×1
STAPLER HERNIA 12 8.5 360D (INSTRUMENTS) ×2 IMPLANT
STRIP CLOSURE SKIN 1/4X4 (GAUZE/BANDAGES/DRESSINGS) ×2 IMPLANT
SUT MNCRL AB 4-0 PS2 18 (SUTURE) ×2 IMPLANT
SUT VIC AB 1 CT1 27 (SUTURE)
SUT VIC AB 1 CT1 27XBRD ANBCTR (SUTURE) IMPLANT
TOWEL OR 17X24 6PK STRL BLUE (TOWEL DISPOSABLE) ×2 IMPLANT
TOWEL OR 17X26 10 PK STRL BLUE (TOWEL DISPOSABLE) ×2 IMPLANT
TRAY FOLEY CATH 16FR SILVER (SET/KITS/TRAYS/PACK) ×2 IMPLANT
TRAY LAPAROSCOPIC (CUSTOM PROCEDURE TRAY) ×2 IMPLANT
TROCAR XCEL 12X100 BLDLESS (ENDOMECHANICALS) IMPLANT
TUBING INSUFFLATION (TUBING) ×2 IMPLANT

## 2014-03-29 NOTE — Transfer of Care (Signed)
Immediate Anesthesia Transfer of Care Note  Patient: Shane Valentine  Procedure(s) Performed: Procedure(s): LAPAROSCOPIC LEFT INGUINAL HERNIA REPAIR (Left) INSERTION OF MESH (Left)  Patient Location: PACU  Anesthesia Type:General  Level of Consciousness: awake, alert  and oriented  Airway & Oxygen Therapy: Patient Spontanous Breathing and Patient connected to nasal cannula oxygen  Post-op Assessment: Report given to PACU RN, Post -op Vital signs reviewed and stable and Patient moving all extremities X 4  Post vital signs: Reviewed and stable  Complications: No apparent anesthesia complications

## 2014-03-29 NOTE — Progress Notes (Signed)
Call to Dr. Irven Shelling office, requested cardiac testing, clearance note & ekg to be faxed to [phone]

## 2014-03-29 NOTE — H&P (Signed)
  HPI  Shane Valentine is a 49 y.o. male. The patient is a 48 year old male who is referred by Dr. Glade Lloyd For evaluation of a left inguinal hernia. The patient states that this arose approximately 1-2 months ago. He states he has pain in the left inguinal area. He states he tried returning to work however secondary to pain could not tolerate the pain. The patient works as a [employer] social research officer.  HPI  No past medical history on file.  No past surgical history on file.  No family history on file.  Social History  History   Substance Use Topics   .  Smoking status:  Current Every Day Smoker   .  Smokeless tobacco:  Not on file   .  Alcohol Use:  Not on file   No Known Allergies  Current Outpatient Prescriptions   Medication  Sig  Dispense  Refill   .  lisinopril-hydrochlorothiazide (PRINZIDE,ZESTORETIC) 20-25 MG per tablet  Take 1 tablet by mouth daily.  30 tablet  2    No current facility-administered medications for this visit.   Review of Systems  Review of Systems  Constitutional: Negative.  HENT: Negative.  Eyes: Negative.  Respiratory: Negative.  Cardiovascular: Negative.  Gastrointestinal: Negative.  Endocrine: Negative.  Neurological: Negative.  Blood pressure 154/100, pulse 50, temperature 97.9 F (36.6 C), temperature source Temporal, resp. rate 16, height 5\' 10"  (1.778 m), weight 178 lb 6.4 oz (80.922 kg).  Physical Exam  Physical Exam  Constitutional: He is oriented to person, place, and time. He appears well-developed and well-nourished.  HENT:  Head: Normocephalic and atraumatic.  Eyes: Conjunctivae and EOM are normal. Pupils are equal, round, and reactive to light.  Neck: Normal range of motion. Neck supple.  Cardiovascular: Normal rate, regular rhythm and normal heart sounds.  Pulmonary/Chest: Effort normal and breath sounds normal.  Abdominal: Bowel sounds are normal. A hernia is present. Hernia confirmed positive in the left inguinal area. Hernia confirmed negative in the  right inguinal area.  Musculoskeletal: Normal range of motion.  Neurological: He is alert and oriented to person, place, and time.  Skin: Skin is warm and dry.  Data Reviewed  none  Assessment  48 year old male with a left likely direct inguinal hernia  Plan  1. The patient would like to proceed to the operating room for laparoscopic left inguinal hernia inguinal hernia repair with Mesh.  2. All risks and benefits were discussed with the patient, to generally include infection, bleeding, damage to surrounding structures, acute and chronic nerve pain, and recurrence. Alternatives were offered and described. All questions were answered and the patient voiced understanding of the procedure and wishes to proceed at this point.

## 2014-03-29 NOTE — Op Note (Signed)
03/29/2014  11:00 AM  PATIENT:  Shane Valentine  48 y.o. male  PRE-OPERATIVE DIAGNOSIS: Left Inguinal Hernia  POST-OPERATIVE DIAGNOSIS:  Left indirect inguinal hernia  PROCEDURE:  Procedure(s): LAPAROSCOPIC LEFT INGUINAL HERNIA REPAIR (Left) INSERTION OF MESH (Left)  SURGEON:  Surgeon(s) and Role:    * Ralene Ok, MD - Primary  ASSISTANTS: none   ANESTHESIA:   local and general  EBL:  Total I/O In: 1000 [I.V.:1000] Out: -   BLOOD ADMINISTERED:none  DRAINS: none   LOCAL MEDICATIONS USED:  BUPIVICAINE   SPECIMEN:  No Specimen  DISPOSITION OF SPECIMEN:  N/A  COUNTS:  YES  TOURNIQUET:  * No tourniquets in log *  DICTATION: .Dragon Dictation Details of the procedure: The patient was taken back to the operating room. The patient was placed in supine position with bilateral SCDs in place. After appropriate anitbiotics were confirmed, a time-out was confirmed and all facts were verified.  0.25% Marcaine was used to infiltrate the umbilical area. He was used to cut down the skin and blunt dissection was used to get the anterior fashion.  The anterior fascia was incised approximately 1 cm and the muscles were divided anteriorly. Blunt dissection was then used to create a space in the preperitoneal area. At this time a 10 mm camera was then introduced into the space and advanced the pubic tubercle and a 12 mm trocar was placed over this and insufflation was started.  At this time aspace was created from medial to laterally in the left preperitoneal space. The hernia sac was identified in the indirect space. Dissection of the hernia sac was undertaken the vas deferens was identified and protected in all parts of the case.   Once the hernia sac was taken down to approximately the umbilicus a 3D Bard light was  introduced into the preperitoneal space.  The mesh was brought over the hernia space defect and anchored into place and secured to Cooper's ligament with 4.83mm staples from a [place]  hernia stapler. It was anchored to the anterior abdominal wall with 4.8 mm staples. The hernia sac was seen lying anterior to the mesh. There was no staples placed laterally. The insufflation was evacuated. The trochars were removed. The anterior fascia was reapproximated using #1 Vicryl on a UR- 6.  Intra-abdominal air was evacuated and the Veress needle removed. The skin was reapproximated using 4-0 Monocryl subcuticular fashion the patient was awakened from general anesthesia and taken to recovery in stable condition.   PLAN OF CARE: Discharge to home after PACU  PATIENT DISPOSITION:  PACU - hemodynamically stable.   Delay start of Pharmacological VTE agent (>24hrs) due to surgical blood loss or risk of bleeding: not applicable

## 2014-03-29 NOTE — Discharge Instructions (Signed)
[facility] _______[facility] ° °INGUINAL HERNIA REPAIR: POST OP INSTRUCTIONS ° °Always review your discharge instruction sheet given to you by the facility where your surgery was performed. °IF YOU HAVE DISABILITY OR FAMILY LEAVE FORMS, YOU MUST BRING THEM TO THE OFFICE FOR PROCESSING.   °DO NOT GIVE THEM TO YOUR DOCTOR. ° °1. A  prescription for pain medication may be given to you upon discharge.  Take your pain medication as prescribed, if needed.  If narcotic pain medicine is not needed, then you may take acetaminophen (Tylenol) or ibuprofen (Advil) as needed. °2. Take your usually prescribed medications unless otherwise directed. °3. If you need a refill on your pain medication, please contact your pharmacy.  They will contact our office to request authorization. Prescriptions will not be filled after 5 pm or on week-ends. °4. You should follow a light diet the first 24 hours after arrival home, such as soup and crackers, etc.  Be sure to include lots of fluids daily.  Resume your normal diet the day after surgery. °5. Most patients will experience some swelling and bruising around the umbilicus or in the groin and scrotum.  Ice packs and reclining will help.  Swelling and bruising can take several days to resolve.  °6. It is common to experience some constipation if taking pain medication after surgery.  Increasing fluid intake and taking a stool softener (such as Colace) will usually help or prevent this problem from occurring.  A mild laxative (Milk of Magnesia or Miralax) should be taken according to package directions if there are no bowel movements after 48 hours. °7. Unless discharge instructions indicate otherwise, you may remove your bandages 24-48 hours after surgery, and you may shower at that time.  You may have steri-strips (small skin tapes) in place directly over the incision.  These strips should be left on the skin for 7-10 days.  If your surgeon used skin glue on the incision, you  may shower in 24 hours.  The glue will flake off over the next 2-3 weeks.  Any sutures or staples will be removed at the office during your follow-up visit. °8. ACTIVITIES:  You may resume regular (light) daily activities beginning the next day--such as daily self-care, walking, climbing stairs--gradually increasing activities as tolerated.  You may have sexual intercourse when it is comfortable.  Refrain from any heavy lifting or straining until approved by your doctor. °a. You may drive when you are no longer taking prescription pain medication, you can comfortably wear a seatbelt, and you can safely maneuver your car and apply brakes. °b. RETURN TO WORK:  __________________________________________________________ °9. You should see your doctor in the office for a follow-up appointment approximately 2-3 weeks after your surgery.  Make sure that you call for this appointment within a day or two after you arrive home to insure a convenient appointment time. °10. OTHER INSTRUCTIONS:  __________________________________________________________________________________________________________________________________________________________________________________________  °WHEN TO CALL YOUR DOCTOR: °1. Fever over 101.0 °2. Inability to urinate °3. Nausea and/or vomiting °4. Extreme swelling or bruising °5. Continued bleeding from incision. °6. Increased pain, redness, or drainage from the incision ° °The clinic staff is available to answer your questions during regular business hours.  Please don’t hesitate to call and ask to speak to one of the nurses for clinical concerns.  If you have a medical emergency, go to the nearest emergency room or call 911.  A surgeon from [facility] is always on call at the hospital ° ° °[address], [address], [place], [place]  [address] ? ° [address] [address], [place],    [address] °[phone] ? [phone] ? FAX [phone] °Web site:  [email or web address] ° °

## 2014-03-29 NOTE — Anesthesia Postprocedure Evaluation (Signed)
  Anesthesia Post-op Note  Patient: Shane Valentine  Procedure(s) Performed: Procedure(s): LAPAROSCOPIC LEFT INGUINAL HERNIA REPAIR (Left) INSERTION OF MESH (Left)  Patient Location: PACU  Anesthesia Type:General  Level of Consciousness: awake  Airway and Oxygen Therapy: Patient Spontanous Breathing  Post-op Pain: mild  Post-op Assessment: Post-op Vital signs reviewed  Post-op Vital Signs: Reviewed  Last Vitals:  Filed Vitals:   03/29/14 1230  BP: 189/98  Pulse: 51  Temp:   Resp: 13    Complications: No apparent anesthesia complications

## 2014-03-29 NOTE — Anesthesia Procedure Notes (Signed)
Procedure Name: Intubation Date/Time: 03/29/2014 9:57 AM Performed by: Neldon Newport Pre-anesthesia Checklist: Patient identified, Timeout performed, Emergency Drugs available, Suction available and Patient being monitored Patient Re-evaluated:Patient Re-evaluated prior to inductionOxygen Delivery Method: Circle system utilized Preoxygenation: Pre-oxygenation with 100% oxygen Intubation Type: IV induction Ventilation: Mask ventilation without difficulty Laryngoscope Size: Mac and 3 Grade View: Grade II Tube type: Oral Tube size: 7.5 mm Number of attempts: 2 (Large Epiglottis.) Placement Confirmation: positive ETCO2,  ETT inserted through vocal cords under direct vision and breath sounds checked- equal and bilateral Secured at: 23 cm Tube secured with: Tape Dental Injury: Teeth and Oropharynx as per pre-operative assessment

## 2014-03-29 NOTE — Anesthesia Preprocedure Evaluation (Addendum)
Anesthesia Evaluation   Patient awake    Reviewed: Allergy & Precautions, H&P , NPO status , Patient's Chart, lab work & pertinent test results  Airway Mallampati: II      Dental  (+) Poor Dentition, Dental Advidsory Given   Pulmonary Current Smoker,          Cardiovascular hypertension,     Neuro/Psych    GI/Hepatic GERD-  ,  Endo/Other    Renal/GU      Musculoskeletal   Abdominal   Peds  Hematology   Anesthesia Other Findings   Reproductive/Obstetrics                          Anesthesia Physical Anesthesia Plan  ASA: III  Anesthesia Plan: General   Post-op Pain Management:    Induction: Intravenous  Airway Management Planned: Oral ETT  Additional Equipment:   Intra-op Plan:   Post-operative Plan: Extubation in OR  Informed Consent: I have reviewed the patients History and Physical, chart, labs and discussed the procedure including the risks, benefits and alternatives for the proposed anesthesia with the patient or authorized representative who has indicated his/her understanding and acceptance.   Dental advisory given and Dental Advisory Given  Plan Discussed with: Anesthesiologist and CRNA  Anesthesia Plan Comments:        Anesthesia Quick Evaluation

## 2014-04-01 ENCOUNTER — Encounter (HOSPITAL_COMMUNITY): Payer: Self-pay | Admitting: General Surgery

## 2014-04-01 ENCOUNTER — Encounter: Payer: Self-pay | Admitting: Medical

## 2014-06-17 ENCOUNTER — Encounter: Payer: Self-pay | Admitting: Medical

## 2014-07-01 ENCOUNTER — Encounter: Payer: Self-pay | Admitting: Medical

## 2014-08-29 ENCOUNTER — Telehealth: Payer: Self-pay | Admitting: Medical

## 2014-08-29 ENCOUNTER — Encounter: Payer: Self-pay | Admitting: Medical

## 2014-08-29 ENCOUNTER — Ambulatory Visit (INDEPENDENT_AMBULATORY_CARE_PROVIDER_SITE_OTHER): Payer: Managed Care, Other (non HMO) | Admitting: Medical

## 2014-08-29 VITALS — BP 140/90 | HR 80 | Temp 98.1°F | Resp 15 | Wt 184.0 lb

## 2014-08-29 DIAGNOSIS — R040 Epistaxis: Secondary | ICD-10-CM

## 2014-08-29 DIAGNOSIS — I10 Essential (primary) hypertension: Secondary | ICD-10-CM

## 2014-08-29 DIAGNOSIS — E785 Hyperlipidemia, unspecified: Secondary | ICD-10-CM

## 2014-08-29 DIAGNOSIS — F4541 Pain disorder exclusively related to psychological factors: Secondary | ICD-10-CM

## 2014-08-29 MED ORDER — LISINOPRIL-HYDROCHLOROTHIAZIDE 20-25 MG PO TABS: 1.0000 | ORAL_TABLET | Freq: Every day | ORAL | Status: DC

## 2014-08-29 MED ORDER — METOPROLOL TARTRATE 50 MG PO TABS: 50.0000 mg | ORAL_TABLET | Freq: Two times a day (BID) | ORAL | Status: DC

## 2014-08-29 MED ORDER — ATORVASTATIN CALCIUM 20 MG PO TABS: 20.0000 mg | ORAL_TABLET | Freq: Every day | ORAL | Status: DC

## 2014-08-29 NOTE — Telephone Encounter (Signed)
please verify, was he already taking 50mg  BID metoprolol or 25mg  TID?  Our chart shows 25mg  BID, but after reading Dr. Irven Shelling notes from 04/2014, he may have already been on 50mg  BID which would mean I would need to bump him up to Metoprolol 100mg  BID?   Please find out.  I already sent 50mg  Metoprolol yesterday.

## 2014-08-29 NOTE — Progress Notes (Signed)
Subjective: Here for BP f/u.  compliant with medication.   Changing insurance though, so needs refill for 90 days.  Avoiding salt. Active/exercising, eating fast food lately thought given recent change in jobs.     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   Review of systems as in subjective  Past Medical History  Diagnosis Date  . 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 Hypertension   . GERD (gastroesophageal reflux disease)     Objective: General: Well-developed, well-nourished, no acute distress Right nare with friable appearing septum, bilat turbinated edema, no discharge, otherwise HENT unremarkable Heart: RRR, normal S1-S2 no murmurs Lungs clear Neck: No obvious thyromegaly or thyroid nodules, no mass, supple, nontenderExtremities no edema Normal pulses throughout Neuro: nonfocal   Assessment: Encounter Diagnoses  Name Primary?  . Essential hypertension Yes  . Hyperlipidemia   . Stress headache   . Nosebleed     Plan: Hypertension-c/t Lisionpril HCT, increase to 50mg  BID Metoprolol Hyperlipidemia - c/t same medication Stress headache - discussed stress reduction, healthy diet, routine exercise Nosebleed - discussed using some Vaseline to coat inside of nares QHS, avoid dry heat, and if not improving, recheck. F/u 67mo.

## 2014-08-30 NOTE — Telephone Encounter (Signed)
That is fine for now but I mentioned that because Dr.Ganji's notes from his last cardiology visit last year showed a higher dose than this.  C/t same recommendations from yesterday, f/u 57mo

## 2014-08-30 NOTE — Telephone Encounter (Signed)
Patient states that he was taking 25 mg BID and [employer] PA changed him too 50 mg BID.

## 2014-08-30 NOTE — Telephone Encounter (Signed)
Patient is aware of Shane Tysinger PA message 

## 2014-12-11 ENCOUNTER — Telehealth: Payer: Self-pay | Admitting: Medical

## 2014-12-11 ENCOUNTER — Other Ambulatory Visit: Payer: Self-pay | Admitting: Family Medicine

## 2014-12-11 MED ORDER — METOPROLOL TARTRATE 50 MG PO TABS: 50.0000 mg | ORAL_TABLET | Freq: Two times a day (BID) | ORAL | Status: DC

## 2014-12-11 MED ORDER — ATORVASTATIN CALCIUM 20 MG PO TABS: 20.0000 mg | ORAL_TABLET | Freq: Every day | ORAL | Status: DC

## 2014-12-11 NOTE — Telephone Encounter (Signed)
Both medications was refilled and sent to [facility] on [address]

## 2014-12-11 NOTE — Telephone Encounter (Signed)
Pt. Called in requesting refill for both bp medications ( metoprolol 50mg  tab , atorvastitin 20mg ) to [facility] on [address]. Pt. # is [phone]

## 2014-12-12 ENCOUNTER — Other Ambulatory Visit: Payer: Self-pay

## 2014-12-12 ENCOUNTER — Telehealth: Payer: Self-pay | Admitting: Medical

## 2014-12-12 MED ORDER — LISINOPRIL-HYDROCHLOROTHIAZIDE 20-25 MG PO TABS: 1.0000 | ORAL_TABLET | Freq: Every day | ORAL | Status: DC

## 2014-12-12 NOTE — Telephone Encounter (Signed)
done

## 2014-12-12 NOTE — Telephone Encounter (Signed)
Pt states only 2 of his meds were refilled yesterday and needs Lisinorpil/HCTZ refilled also

## 2015-01-07 ENCOUNTER — Encounter: Payer: Self-pay | Admitting: Medical

## 2015-01-07 ENCOUNTER — Telehealth: Payer: Self-pay | Admitting: Medical

## 2015-01-07 ENCOUNTER — Ambulatory Visit (INDEPENDENT_AMBULATORY_CARE_PROVIDER_SITE_OTHER): Payer: Self-pay | Admitting: Medical

## 2015-01-07 VITALS — BP 112/80 | HR 68 | Temp 98.4°F | Resp 14 | Wt 176.0 lb

## 2015-01-07 DIAGNOSIS — R29810 Facial weakness: Secondary | ICD-10-CM

## 2015-01-07 DIAGNOSIS — I10 Essential (primary) hypertension: Secondary | ICD-10-CM

## 2015-01-07 DIAGNOSIS — I639 Cerebral infarction, unspecified: Secondary | ICD-10-CM

## 2015-01-07 DIAGNOSIS — R29898 Other symptoms and signs involving the musculoskeletal system: Secondary | ICD-10-CM

## 2015-01-07 DIAGNOSIS — Z139 Encounter for screening, unspecified: Secondary | ICD-10-CM

## 2015-01-07 DIAGNOSIS — Z720 Tobacco use: Secondary | ICD-10-CM

## 2015-01-07 DIAGNOSIS — E785 Hyperlipidemia, unspecified: Secondary | ICD-10-CM

## 2015-01-07 DIAGNOSIS — F17200 Nicotine dependence, unspecified, uncomplicated: Secondary | ICD-10-CM

## 2015-01-07 LAB — COMPREHENSIVE METABOLIC PANEL
ALT: 8 U/L (ref 0–53)
AST: 13 U/L (ref 0–37)
Albumin: 4.3 g/dL (ref 3.5–5.2)
Alkaline Phosphatase: 74 U/L (ref 39–117)
BUN: 19 mg/dL (ref 6–23)
CALCIUM: 9.4 mg/dL (ref 8.4–10.5)
CO2: 26 meq/L (ref 19–32)
Chloride: 107 mEq/L (ref 96–112)
Creat: 1.18 mg/dL (ref 0.50–1.35)
GLUCOSE: 124 mg/dL — AB (ref 70–99)
Potassium: 4.1 mEq/L (ref 3.5–5.3)
SODIUM: 145 meq/L (ref 135–145)
TOTAL PROTEIN: 6.7 g/dL (ref 6.0–8.3)
Total Bilirubin: 0.4 mg/dL (ref 0.2–1.2)

## 2015-01-07 LAB — CBC WITH DIFFERENTIAL/PLATELET
Basophils Absolute: 0 10*3/uL (ref 0.0–0.1)
Basophils Relative: 0 % (ref 0–1)
EOS ABS: 0.1 10*3/uL (ref 0.0–0.7)
EOS PCT: 1 % (ref 0–5)
HEMATOCRIT: 43.1 % (ref 39.0–52.0)
HEMOGLOBIN: 14 g/dL (ref 13.0–17.0)
Lymphocytes Relative: 21 % (ref 12–46)
Lymphs Abs: 2 10*3/uL (ref 0.7–4.0)
MCH: 30.5 pg (ref 26.0–34.0)
MCHC: 32.5 g/dL (ref 30.0–36.0)
MCV: 93.9 fL (ref 78.0–100.0)
MONO ABS: 0.5 10*3/uL (ref 0.1–1.0)
MPV: 10 fL (ref 8.6–12.4)
Monocytes Relative: 5 % (ref 3–12)
Neutro Abs: 6.8 10*3/uL (ref 1.7–7.7)
Neutrophils Relative %: 73 % (ref 43–77)
Platelets: 241 10*3/uL (ref 150–400)
RBC: 4.59 MIL/uL (ref 4.22–5.81)
RDW: 13.6 % (ref 11.5–15.5)
WBC: 9.3 10*3/uL (ref 4.0–10.5)

## 2015-01-07 MED ORDER — ASPIRIN EC 81 MG PO TBEC: 81.0000 mg | DELAYED_RELEASE_TABLET | Freq: Every day | ORAL | Status: DC

## 2015-01-07 MED ORDER — ATORVASTATIN CALCIUM 20 MG PO TABS: 20.0000 mg | ORAL_TABLET | Freq: Every day | ORAL | Status: DC

## 2015-01-07 MED ORDER — METOPROLOL TARTRATE 50 MG PO TABS: 50.0000 mg | ORAL_TABLET | Freq: Two times a day (BID) | ORAL | Status: DC

## 2015-01-07 NOTE — Progress Notes (Signed)
Subjective: Here today accompanied by his daughter.  Here for left hand weakness.  Father's day morning 2 days ago, washed his car, went to take nap around lunch, awoke 2 o'clock and left hand wouldn't work at all.  Can barely move the hand, has some tingling and numbness.  No other symptoms but the weakness has persisted.   He denies slurred speech, visual or hearing changes, no other weakness, no numbness, no fall.  Doesn't check BP in general.  He is compliant with metoprolol but apparently is not taking Lisinopril HCT.  He is compliant with statin.  Not on aspirin  He continues to smoke.   No chest pain, no edema, no SOB. No other aggravating or relieving factors. No other complaint.   Past Medical History  Diagnosis Date  . Tuberculosis 1995    66mo therapy, [place]  . Hypertension   . GERD (gastroesophageal reflux disease)   . Hyperlipidemia   . Smoker    ROS as in subjective  Objective: BP 112/80 mmHg  Pulse 68  Temp(Src) 98.4 F (36.9 C) (Oral)  Resp 14  Wt 176 lb (79.833 kg)  General appearance: alert, no distress, WD/WN, AA male Oral cavity: MMM, no lesions Neck: supple, no lymphadenopathy, no thyromegaly, no masses, no bruits Heart: RRR, normal S1, S2, no murmurs Lungs: CTA bilaterally, no wheezes, rhonchi, or rales Extremities: no edema, no cyanosis, no clubbing Pulses: 2+ symmetric, upper and lower extremities, normal cap refill Neurological: left wrist unable to extend, left cheek with subtle weakness, and missed 2/3 recall on MMSE, otherwise alert, oriented x 3, CN2-12 intact, strength normal upper extremities and lower extremities, sensation normal throughout, DTRs 2+ throughout, no cerebellar signs, gait normal Psychiatric: normal affect, behavior normal, pleasant    Assessment: Encounter Diagnoses  Name Primary?  . Wrist weakness Yes  . Facial weakness   . Essential hypertension   . Smoker   . Hyperlipidemia   . CVA (cerebral vascular accident)       Plan: Discussed symptoms and exam findings which are suggestive of stroke x 2 days ago.  Advised MRI brain, carotid dopplers, but he declines both.  He unfortunately is self pay and can't afford those today.   At this point the likelihood is ischemic focal infarct.  discussed case with Dr. Redmond School supervising physician.  He is compliant with BP (metoprolol) and cholesterol medication, although he was not clear on Lisinopril HCT which he should also be taking.  Of note, he was suppose to have cardiac cath early last year after seeing Dr. Einar Gip, but he is not sure why this was never done.  Begin Aspirin 325mg  daily, c/t Metoprolol, c/t Lipitor, labs today (non fasting), and referral to neurology ASAP.  Recommend PT/OT but he declines today.  Discussed signs of stroke, symptoms /signs that would prompt 911 call.   >45 minutes spent face to face in discussion, eval and plan.

## 2015-01-07 NOTE — Patient Instructions (Signed)
Begin Aspirin 325mg  daily  Check blood pressures daily and get me readings in 1 -2 weeks  Continue Metoprolol and Lipitor daily  We will call with lab results  We will call with neurology referral  If any new stroke symptoms, call 911 immediately   Stroke (Cerebrovascular Accident)  S[place]le These symptoms usually develop suddenly (or may be newly present upon awakening from sleep):  Sudden weakness or numbness of the face, arm, or leg, especially on one side of the body.   Sudden confusion.   Trouble speaking (aphasia) or understanding.   Sudden trouble seeing in one or both eyes.   Sudden trouble walking.   Dizziness.   Loss of balance or coordination.   Sudden severe headache with no known cause.   SEEK IMMEDIATE MEDICAL CARE IF:   You have sudden weakness or numbness of the face, arm, or leg, especially on one side of the body.   You have sudden confusion.   You have trouble speaking or understanding.   You have sudden trouble seeing in one or both eyes.   You have sudden trouble walking.   You have dizziness.   You have a loss of balance or coordination.   You have a sudden severe headache with no known cause.   You have a fever.   You are coughing or have difficulty breathing.   You have new chest pain, angina, or an irregular heartbeat.  ANY OF THESE SYMPTOMS MAY REPRESENT A SERIOUS PROBLEM THAT IS AN EMERGENCY. Do not wait to see if the symptoms will go away. Get medical help right away. Call your local emergency services (911 in [place] DO NOT drive yourself to the hospital.  TREATMENT  TIME IS OF THE ESSENCE! It is important to seek treatment within 4 hours of the start of symptoms because you may receive a "clot dissolving" medication that cannot be given after that time. Even if you don't know when your symptoms began, get treatment as soon as possible.    WHAT IS A STROKE: A stroke is the sudden death of brain tissue.  It is a medical emergency. A stroke can cause permanent loss of brain function. This can cause problems with different parts of your body. A TIA (transient ischemic attack) is different because it does not cause permanent damage. A TIA is a short-lived problem of poor blood flow affecting a part of the nervous system. TIA is also a serious problem because having a TIA greatly increases the chances of having a stroke. When symptoms first develop, you cannot know if the problem might be a stroke or TIA. CAUSES  A stroke is caused by a decrease of oxygen supply to an area of your brain. It is usually the result of a small blood clot or the arteries hardening. A stroke can also be caused by blocked or damaged carotid arteries. Bleeding in the brain can cause, or accompany, a stroke.  RISK FACTORS  High blood pressure (hypertension).   High cholesterol.   Diabetes.   Heart disease.   The buildup of fatty deposits in the blood vessels (peripheral artery disease or atherosclerosis).   An abnormal heart rhythm (atrial fibrillation).   Obesity.   Smoking.   Taking oral contraceptives (especially in combination with smoking).   Physical inactivity.   A diet high in fats, salt (sodium), and calories.   Alcohol use.   Use of illegal drugs (especially cocaine and methamphetamine).   Being a male.  Being an Sales promotion account executive.   Age over 34.   Family history of stroke.   Previous history of blood clots, a "warning stroke" (transient ischemic attack, TIA), or heart attack.   Sickle cell disease.

## 2015-01-07 NOTE — Telephone Encounter (Signed)
pls pull paper chart for Dr. Irven Shelling last notes ASAP  Refer to neurology ASAP for recent stroke.  He has no insurance, but I need him seen soon.

## 2015-01-08 ENCOUNTER — Other Ambulatory Visit: Payer: Self-pay | Admitting: Family Medicine

## 2015-01-08 ENCOUNTER — Telehealth: Payer: Self-pay | Admitting: Medical

## 2015-01-08 ENCOUNTER — Other Ambulatory Visit: Payer: Self-pay | Admitting: Medical

## 2015-01-08 DIAGNOSIS — I6350 Cerebral infarction due to unspecified occlusion or stenosis of unspecified cerebral artery: Secondary | ICD-10-CM

## 2015-01-08 DIAGNOSIS — I639 Cerebral infarction, unspecified: Secondary | ICD-10-CM

## 2015-01-08 MED ORDER — NICOTINE 21 MG/24HR TD PT24: 21.0000 mg | MEDICATED_PATCH | Freq: Every day | TRANSDERMAL | Status: DC

## 2015-01-08 NOTE — Telephone Encounter (Signed)
Please call Medicaid office on his behalf. What would he need to do or show to get emergency medicaid.   He has children, he has no insurance, he just got diagnosed with stroke which will likely affect his ability to work temporarily and needs other medical interventions which may be costly.  Please pass along this to patient.

## 2015-01-08 NOTE — Telephone Encounter (Signed)
Patient was made aware to try and contact Medicaid office to see about getting temporary medicaid due to his recent stroke. Patient states that he will most definitely check on this. Patient was given the number to Medicaid office in [place]. I put orders in [facility] for the [facility] and made the patient aware that he will need to pay $185.oo at the time of his appointment.  Patient will hold off on OT. He states that he 2 friends that are nurses that will help him with his hand therapy.

## 2015-01-14 ENCOUNTER — Other Ambulatory Visit: Payer: Self-pay

## 2015-01-14 DIAGNOSIS — Z139 Encounter for screening, unspecified: Secondary | ICD-10-CM

## 2015-01-14 DIAGNOSIS — R29810 Facial weakness: Secondary | ICD-10-CM

## 2015-01-14 DIAGNOSIS — I639 Cerebral infarction, unspecified: Secondary | ICD-10-CM

## 2015-01-14 DIAGNOSIS — F17200 Nicotine dependence, unspecified, uncomplicated: Secondary | ICD-10-CM

## 2015-01-14 DIAGNOSIS — E785 Hyperlipidemia, unspecified: Secondary | ICD-10-CM

## 2015-01-14 DIAGNOSIS — I10 Essential (primary) hypertension: Secondary | ICD-10-CM

## 2015-01-14 DIAGNOSIS — R29898 Other symptoms and signs involving the musculoskeletal system: Secondary | ICD-10-CM

## 2015-01-14 LAB — LIPID PANEL
Cholesterol: 197 mg/dL (ref 0–200)
HDL: 40 mg/dL (ref >=40)
LDL CALC: 132 mg/dL — AB (ref 0–99)
Total CHOL/HDL Ratio: 4.9 Ratio
Triglycerides: 127 mg/dL (ref <150)
VLDL: 25 mg/dL (ref 0–40)

## 2015-01-15 ENCOUNTER — Other Ambulatory Visit: Payer: Self-pay | Admitting: Medical

## 2015-01-15 LAB — PROTIME-INR
INR: 1.04 (ref <1.50)
Prothrombin Time: 13.6 seconds (ref 11.6–15.2)

## 2015-01-15 LAB — APTT: APTT: 34 s (ref 24–37)

## 2015-01-15 MED ORDER — ROSUVASTATIN CALCIUM 40 MG PO TABS: 40.0000 mg | ORAL_TABLET | Freq: Every day | ORAL | Status: DC

## 2015-01-16 ENCOUNTER — Encounter: Payer: Self-pay | Admitting: Medical

## 2015-01-30 ENCOUNTER — Telehealth: Payer: Self-pay | Admitting: Internal Medicine

## 2015-01-30 NOTE — Telephone Encounter (Signed)
Faxed over medical records to Disability determination services @ [phone] today

## 2015-01-31 DIAGNOSIS — Z0279 Encounter for issue of other medical certificate: Secondary | ICD-10-CM

## 2015-02-05 ENCOUNTER — Telehealth: Payer: Self-pay | Admitting: Internal Medicine

## 2015-02-05 NOTE — Telephone Encounter (Signed)
Faxed over medical records to Disability determination services from 07/2013-present  @ [phone] today

## 2015-02-06 ENCOUNTER — Ambulatory Visit: Payer: Managed Care, Other (non HMO) | Admitting: Neurology

## 2015-03-14 ENCOUNTER — Ambulatory Visit: Payer: Managed Care, Other (non HMO) | Admitting: Neurology

## 2015-03-20 ENCOUNTER — Encounter (HOSPITAL_COMMUNITY): Payer: Self-pay | Admitting: *Deleted

## 2015-03-20 ENCOUNTER — Observation Stay (HOSPITAL_COMMUNITY)
Admission: EM | Admit: 2015-03-20 | Discharge: 2015-03-21 | Disposition: A | Discharge status: Home / Self Care | Payer: Self-pay | Attending: Family Medicine | Admitting: Family Medicine

## 2015-03-20 DIAGNOSIS — R202 Paresthesia of skin: Secondary | ICD-10-CM

## 2015-03-20 DIAGNOSIS — Z79899 Other long term (current) drug therapy: Secondary | ICD-10-CM | POA: Insufficient documentation

## 2015-03-20 DIAGNOSIS — E785 Hyperlipidemia, unspecified: Secondary | ICD-10-CM | POA: Diagnosis present

## 2015-03-20 DIAGNOSIS — Z720 Tobacco use: Secondary | ICD-10-CM | POA: Insufficient documentation

## 2015-03-20 DIAGNOSIS — R200 Anesthesia of skin: Principal | ICD-10-CM

## 2015-03-20 DIAGNOSIS — K219 Gastro-esophageal reflux disease without esophagitis: Secondary | ICD-10-CM | POA: Insufficient documentation

## 2015-03-20 DIAGNOSIS — E876 Hypokalemia: Secondary | ICD-10-CM | POA: Diagnosis present

## 2015-03-20 DIAGNOSIS — I10 Essential (primary) hypertension: Secondary | ICD-10-CM | POA: Diagnosis present

## 2015-03-20 DIAGNOSIS — Z8673 Personal history of transient ischemic attack (TIA), and cerebral infarction without residual deficits: Secondary | ICD-10-CM | POA: Insufficient documentation

## 2015-03-20 DIAGNOSIS — R51 Headache: Secondary | ICD-10-CM | POA: Insufficient documentation

## 2015-03-20 DIAGNOSIS — Z8611 Personal history of tuberculosis: Secondary | ICD-10-CM | POA: Insufficient documentation

## 2015-03-20 DIAGNOSIS — Z7982 Long term (current) use of aspirin: Secondary | ICD-10-CM | POA: Insufficient documentation

## 2015-03-20 HISTORY — DX: Cerebral infarction, unspecified: I63.9

## 2015-03-20 NOTE — ED Notes (Addendum)
Pt c/o bilateral arm numbness, headache that started today, reports that he has had numbness similar before and was diagnosed with a stroke on father's day of this year, pt reports that the numbness started between 1-2 hours ago,

## 2015-03-20 NOTE — ED Notes (Signed)
Dr Tomi Bamberger notified

## 2015-03-20 NOTE — ED Provider Notes (Signed)
CSN: [id number]     Arrival date & time 03/20/15  2312 History  This chart was scribed for Rolland Porter, MD by Helane Gunther, ED Scribe. This patient was seen in room APA08/APA08 and the patient's care was started at 12:11 AM.    Chief Complaint  Patient presents with  . Headache  . Numbness    bilateral arm numbness and headache   The history is provided by the patient. No language interpreter was used.   HPI Comments: Shane Valentine is a 49 y.o. male smoker at 0.5 ppd with a PMHx of stroke on fathers day of this year who presents to the Emergency Department complaining of a constant, throbbing HA and numbness onset about 8 pm while working on his car. He states his left hand became tense and he was having trouble gripping things. He also had some numbness in his right hand. He notes the numbness goes from the left hand up to the the left upper arm just above the elbow, and only in the right hand. Pt states he has had similar headaches before. He notes slight left leg numbness at baseline since his hernia repair operation 1 year ago. After his previous stroke he went to his PCP (at [place]) 2 days later and was told he had had a stroke. He was advised to increase his aspirin from 81 mg a day to 325 mg a day. He denies having a CT scan or MRI at the time. He had an appointment with a neurologist in [place], in July and August (last week), but he canceled each appointment because of lact of funds. He is currently on BP medication, as well as 325 mg aspirin. He notes he ran out of his Lisinopril-HCTZ 3 days ago. Pt is left-handed. He denies drinking alcohol. Pt denies n/v, and visual disturbances.  PCP Oxford, PA Tysinger  Past Medical History  Diagnosis Date  . Tuberculosis 1995    41mo therapy, [place]  . Hypertension   . GERD (gastroesophageal reflux disease)   . Hyperlipidemia   . Smoker   . Stroke    Past Surgical History  Procedure Laterality Date  . No past  surgeries    . Inguinal hernia repair Left 03/29/2014    Procedure: LAPAROSCOPIC LEFT INGUINAL HERNIA REPAIR;  Surgeon: Ralene Ok, MD;  Location: [facility];  Service: General;  Laterality: Left;  . Insertion of mesh Left 03/29/2014    Procedure: INSERTION OF MESH;  Surgeon: Ralene Ok, MD;  Location: [facility];  Service: General;  Laterality: Left;  . Hernia repair     History reviewed. No pertinent family history. Social History  Substance Use Topics  . Smoking status: Current Every Day Smoker -- 0.50 packs/day for 23 years    Types: Cigarettes  . Smokeless tobacco: None  . Alcohol Use: No   employed  applying for disability   Review of Systems  Eyes: Negative for visual disturbance.  Gastrointestinal: Negative for nausea and vomiting.  Neurological: Positive for numbness and headaches.  All other systems reviewed and are negative.   Allergies  Review of patient's allergies indicates no known allergies.  Home Medications   Prior to Admission medications   Medication Sig Start Date End Date Taking? Authorizing Provider  aspirin EC 81 MG tablet Take 1 tablet (81 mg total) by mouth daily. 01/07/15  Yes Camelia Eng Tysinger, PA-C  lisinopril-hydrochlorothiazide (PRINZIDE,ZESTORETIC) 20-25 MG per tablet Take 1 tablet by mouth daily. 12/12/14  Yes Carlena Hurl,  PA-C  metoprolol (LOPRESSOR) 50 MG tablet Take 1 tablet (50 mg total) by mouth 2 (two) times daily. 01/07/15  Yes Camelia Eng Tysinger, PA-C  rosuvastatin (CRESTOR) 40 MG tablet Take 1 tablet (40 mg total) by mouth daily. 01/15/15  Yes Camelia Eng Tysinger, PA-C  nicotine (NICODERM CQ - DOSED IN MG/24 HOURS) 21 mg/24hr patch Place 1 patch (21 mg total) onto the skin daily. 01/08/15   Camelia Eng Tysinger, PA-C  oxyCODONE-acetaminophen (ROXICET) 5-325 MG per tablet Take 1-2 tablets by mouth every 4 (four) hours as needed. 03/29/14   Ralene Ok, MD   BP 158/104 mmHg  Pulse 76  Temp(Src) 98.3 F (36.8 C) (Oral)  Resp 18  Ht 5\' 10"   (1.778 m)  Wt 171 lb (77.565 kg)  BMI 24.54 kg/m2  SpO2 97%  Vital signs normal except for hypertension  Physical Exam  Constitutional: He is oriented to person, place, and time. He appears well-developed and well-nourished.  Non-toxic appearance. He does not appear ill. No distress.  HENT:  Head: Normocephalic and atraumatic.  Right Ear: External ear normal.  Left Ear: External ear normal.  Nose: Nose normal. No mucosal edema or rhinorrhea.  Mouth/Throat: Oropharynx is clear and moist and mucous membranes are normal. No dental abscesses or uvula swelling.  Eyes: Conjunctivae and EOM are normal. Pupils are equal, round, and reactive to light.  Neck: Normal range of motion and full passive range of motion without pain. Neck supple.  Cardiovascular: Normal rate, regular rhythm and normal heart sounds.  Exam reveals no gallop and no friction rub.   No murmur heard. Pulmonary/Chest: Effort normal and breath sounds normal. No respiratory distress. He has no wheezes. He has no rhonchi. He has no rales. He exhibits no tenderness and no crepitus.  Abdominal: Soft. Normal appearance and bowel sounds are normal. He exhibits no distension. There is no tenderness. There is no rebound and no guarding.  Musculoskeletal: Normal range of motion. He exhibits no edema or tenderness.  Moves all extremities well.   Neurological: He is alert and oriented to person, place, and time. He has normal strength. No cranial nerve deficit.  No pronator drift. Heel to shin normal bilaterally. Equal grip strength.  Skin: Skin is warm, dry and intact. No rash noted. No erythema. No pallor.  Psychiatric: He has a normal mood and affect. His speech is normal and behavior is normal. His mood appears not anxious.  Nursing note and vitals reviewed.   ED Course  Procedures   Medications  potassium chloride SA (K-DUR,KLOR-CON) CR tablet 40 mEq (40 mEq Oral Given 03/21/15 0214)    DIAGNOSTIC STUDIES: Oxygen Saturation is  96% on RA, adequate by my interpretation.    COORDINATION OF CARE: 12:36 AM - Saw pt again for completion of exam, which was interrupted due to the pt having to use the restroom. Discussed plans to order stroke evaluation. Pt advised of plan for treatment and pt agrees.  Patient was given oral potassium for his hypokalemia.  After reviewing patient's CT scan tele-neurology consult was requested.  02:52 Dr Cindy Hazy, specialists on call, neurology called to discuss reason for consult  03:05 Dr Cindy Hazy, has talked to patient, he feels patient needs admission to the hospital for stroke evaluation for possible stroke in June that is not seen on CT scan tonight. He also wants his neck evaluated to see if that would explain him having bilateral hand pain tonight.  03:28 Dr Shanon Brow, admit to obs, telemetry  Labs Review Results  for orders placed or performed during the hospital encounter of 03/20/15  Ethanol  Result Value Ref Range   Alcohol, Ethyl (B) <5 <5 mg/dL  Protime-INR  Result Value Ref Range   Prothrombin Time 14.1 11.6 - 15.2 seconds   INR 1.07 0.00 - 1.49  APTT  Result Value Ref Range   aPTT 35 24 - 37 seconds  CBC  Result Value Ref Range   WBC 11.4 (H) 4.0 - 10.5 K/uL   RBC 3.91 (L) 4.22 - 5.81 MIL/uL   Hemoglobin 12.3 (L) 13.0 - 17.0 g/dL   HCT 36.7 (L) 39.0 - 52.0 %   MCV 93.9 78.0 - 100.0 fL   MCH 31.5 26.0 - 34.0 pg   MCHC 33.5 30.0 - 36.0 g/dL   RDW 13.3 11.5 - 15.5 %   Platelets 215 150 - 400 K/uL  Differential  Result Value Ref Range   Neutrophils Relative % 73 43 - 77 %   Neutro Abs 8.4 (H) 1.7 - 7.7 K/uL   Lymphocytes Relative 20 12 - 46 %   Lymphs Abs 2.2 0.7 - 4.0 K/uL   Monocytes Relative 6 3 - 12 %   Monocytes Absolute 0.7 0.1 - 1.0 K/uL   Eosinophils Relative 1 0 - 5 %   Eosinophils Absolute 0.1 0.0 - 0.7 K/uL   Basophils Relative 0 0 - 1 %   Basophils Absolute 0.0 0.0 - 0.1 K/uL  Comprehensive metabolic panel  Result Value Ref Range   Sodium  138 135 - 145 mmol/L   Potassium 2.9 (L) 3.5 - 5.1 mmol/L   Chloride 107 101 - 111 mmol/L   CO2 26 22 - 32 mmol/L   Glucose, Bld 143 (H) 65 - 99 mg/dL   BUN 19 6 - 20 mg/dL   Creatinine, Ser 1.14 0.61 - 1.24 mg/dL   Calcium 8.6 (L) 8.9 - 10.3 mg/dL   Total Protein 6.7 6.5 - 8.1 g/dL   Albumin 4.0 3.5 - 5.0 g/dL   AST 24 15 - 41 U/L   ALT 11 (L) 17 - 63 U/L   Alkaline Phosphatase 74 38 - 126 U/L   Total Bilirubin 0.5 0.3 - 1.2 mg/dL   GFR calc non Af Amer >60 >60 mL/min   GFR calc Af Amer >60 >60 mL/min   Anion gap 5 5 - 15  Urine rapid drug screen (hosp performed)not at [facility]  Result Value Ref Range   Opiates NONE DETECTED NONE DETECTED   Cocaine NONE DETECTED NONE DETECTED   Benzodiazepines NONE DETECTED NONE DETECTED   Amphetamines NONE DETECTED NONE DETECTED   Tetrahydrocannabinol POSITIVE (A) NONE DETECTED   Barbiturates NONE DETECTED NONE DETECTED  Urinalysis, Routine w reflex microscopic (not at [facility])  Result Value Ref Range   Color, Urine YELLOW YELLOW   APPearance CLEAR CLEAR   Specific Gravity, Urine <1.005 (L) 1.005 - 1.030   pH 6.5 5.0 - 8.0   Glucose, UA NEGATIVE NEGATIVE mg/dL   Hgb urine dipstick NEGATIVE NEGATIVE   Bilirubin Urine NEGATIVE NEGATIVE   Ketones, ur NEGATIVE NEGATIVE mg/dL   Protein, ur NEGATIVE NEGATIVE mg/dL   Urobilinogen, UA 1.0 0.0 - 1.0 mg/dL   Nitrite NEGATIVE NEGATIVE   Leukocytes, UA NEGATIVE NEGATIVE  Troponin I  Result Value Ref Range   Troponin I <0.03 <0.031 ng/mL    Laboratory interpretation all normal except hypokalemia (patient on diurectic), positive UDS for marijuana, mild anemia  Imaging Review Ct Head Wo Contrast  03/21/2015  CLINICAL DATA:  Constant throbbing headache. Left arm weakness and paresthesias. Duration 3.5 hours.  EXAM: CT HEAD WITHOUT CONTRAST  TECHNIQUE: Contiguous axial images were obtained from the base of the skull through the vertex without intravenous contrast.  COMPARISON:  None.  FINDINGS: There is no  intracranial hemorrhage, mass or evidence of acute infarction. There is no extra-axial fluid collection. Gray matter and white matter appear normal. Cerebral volume is normal for age. Brainstem and posterior fossa are unremarkable. The CSF spaces appear normal.  The bony structures are intact. There is severe chronic sinusitis involving the right frontal, ethmoid and maxillary sinuses. These sinuses are completely opacified and exhibit moderate bony sclerosis and bony thickening in thickened of of chronicity.  IMPRESSION: 1. Normal brain 2. Severe chronic right frontal, ethmoid and maxillary sinusitis   Electronically Signed   By: Andreas Newport M.D.   On: 03/21/2015 01:05   I have personally reviewed and evaluated these images and lab results as part of my medical decision-making.   EKG Interpretation   Date/Time:  Thursday March 20 2015 23:39:58 EDT Ventricular Rate:  81 PR Interval:  188 QRS Duration: 89 QT Interval:  352 QTC Calculation: 408 R Axis:   71 Text Interpretation:  Sinus rhythm Borderline T abnormalities, lateral  leads No old tracing to compare Confirmed by Burman Bruington  MD-I, Davian Wollenberg ([id number]) on  03/20/2015 11:59:12 PM      MDM   Final diagnoses:  Numbness and tingling in left arm  Right arm numbness   Plan admission  Rolland Porter, MD, FACEP   I personally performed the services described in this documentation, which was scribed in my presence. The recorded information has been reviewed and considered.  Rolland Porter, MD, Barbette Or, MD 03/21/15 [phone]

## 2015-03-21 ENCOUNTER — Observation Stay (HOSPITAL_COMMUNITY): Payer: Self-pay

## 2015-03-21 ENCOUNTER — Observation Stay (HOSPITAL_COMMUNITY): Payer: Managed Care, Other (non HMO)

## 2015-03-21 ENCOUNTER — Encounter (HOSPITAL_COMMUNITY): Payer: Self-pay

## 2015-03-21 ENCOUNTER — Ambulatory Visit (HOSPITAL_COMMUNITY): Payer: Managed Care, Other (non HMO)

## 2015-03-21 ENCOUNTER — Observation Stay (HOSPITAL_BASED_OUTPATIENT_CLINIC_OR_DEPARTMENT_OTHER): Payer: Managed Care, Other (non HMO)

## 2015-03-21 ENCOUNTER — Telehealth: Payer: Self-pay | Admitting: Medical

## 2015-03-21 ENCOUNTER — Emergency Department (HOSPITAL_COMMUNITY): Payer: Self-pay

## 2015-03-21 DIAGNOSIS — R202 Paresthesia of skin: Secondary | ICD-10-CM | POA: Diagnosis not present

## 2015-03-21 DIAGNOSIS — R200 Anesthesia of skin: Secondary | ICD-10-CM

## 2015-03-21 DIAGNOSIS — I10 Essential (primary) hypertension: Secondary | ICD-10-CM

## 2015-03-21 DIAGNOSIS — E785 Hyperlipidemia, unspecified: Secondary | ICD-10-CM

## 2015-03-21 DIAGNOSIS — R208 Other disturbances of skin sensation: Secondary | ICD-10-CM

## 2015-03-21 DIAGNOSIS — E876 Hypokalemia: Secondary | ICD-10-CM | POA: Diagnosis present

## 2015-03-21 LAB — TROPONIN I

## 2015-03-21 LAB — BASIC METABOLIC PANEL
ANION GAP: 3 — AB (ref 5–15)
BUN: 15 mg/dL (ref 6–20)
CALCIUM: 8.3 mg/dL — AB (ref 8.9–10.3)
CO2: 27 mmol/L (ref 22–32)
Chloride: 107 mmol/L (ref 101–111)
Creatinine, Ser: 1.03 mg/dL (ref 0.61–1.24)
GFR calc Af Amer: >60 mL/min (ref >60)
GLUCOSE: 98 mg/dL (ref 65–99)
POTASSIUM: 3.3 mmol/L — AB (ref 3.5–5.1)
SODIUM: 137 mmol/L (ref 135–145)

## 2015-03-21 LAB — ETHANOL: Alcohol, Ethyl (B): <5 mg/dL (ref <5)

## 2015-03-21 LAB — COMPREHENSIVE METABOLIC PANEL
ALT: 11 U/L — ABNORMAL LOW (ref 17–63)
ANION GAP: 5 (ref 5–15)
AST: 24 U/L (ref 15–41)
Albumin: 4 g/dL (ref 3.5–5.0)
Alkaline Phosphatase: 74 U/L (ref 38–126)
BILIRUBIN TOTAL: 0.5 mg/dL (ref 0.3–1.2)
BUN: 19 mg/dL (ref 6–20)
CO2: 26 mmol/L (ref 22–32)
Calcium: 8.6 mg/dL — ABNORMAL LOW (ref 8.9–10.3)
Chloride: 107 mmol/L (ref 101–111)
Creatinine, Ser: 1.14 mg/dL (ref 0.61–1.24)
GFR calc Af Amer: >60 mL/min (ref >60)
Glucose, Bld: 143 mg/dL — ABNORMAL HIGH (ref 65–99)
POTASSIUM: 2.9 mmol/L — AB (ref 3.5–5.1)
Sodium: 138 mmol/L (ref 135–145)
TOTAL PROTEIN: 6.7 g/dL (ref 6.5–8.1)

## 2015-03-21 LAB — CBC
HEMATOCRIT: 36.7 % — AB (ref 39.0–52.0)
HEMOGLOBIN: 12.3 g/dL — AB (ref 13.0–17.0)
MCH: 31.5 pg (ref 26.0–34.0)
MCHC: 33.5 g/dL (ref 30.0–36.0)
MCV: 93.9 fL (ref 78.0–100.0)
Platelets: 215 10*3/uL (ref 150–400)
RBC: 3.91 MIL/uL — ABNORMAL LOW (ref 4.22–5.81)
RDW: 13.3 % (ref 11.5–15.5)
WBC: 11.4 10*3/uL — ABNORMAL HIGH (ref 4.0–10.5)

## 2015-03-21 LAB — URINALYSIS, ROUTINE W REFLEX MICROSCOPIC
Bilirubin Urine: NEGATIVE
Glucose, UA: NEGATIVE mg/dL
HGB URINE DIPSTICK: NEGATIVE
Ketones, ur: NEGATIVE mg/dL
LEUKOCYTES UA: NEGATIVE
Nitrite: NEGATIVE
Protein, ur: NEGATIVE mg/dL
UROBILINOGEN UA: 1 mg/dL (ref 0.0–1.0)
pH: 6.5 (ref 5.0–8.0)

## 2015-03-21 LAB — DIFFERENTIAL
Basophils Absolute: 0 10*3/uL (ref 0.0–0.1)
Basophils Relative: 0 % (ref 0–1)
EOS ABS: 0.1 10*3/uL (ref 0.0–0.7)
EOS PCT: 1 % (ref 0–5)
LYMPHS ABS: 2.2 10*3/uL (ref 0.7–4.0)
Lymphocytes Relative: 20 % (ref 12–46)
MONOS PCT: 6 % (ref 3–12)
Monocytes Absolute: 0.7 10*3/uL (ref 0.1–1.0)
Neutro Abs: 8.4 10*3/uL — ABNORMAL HIGH (ref 1.7–7.7)
Neutrophils Relative %: 73 % (ref 43–77)

## 2015-03-21 LAB — RAPID URINE DRUG SCREEN, HOSP PERFORMED
AMPHETAMINES: NOT DETECTED
BENZODIAZEPINES: NOT DETECTED
Barbiturates: NOT DETECTED
Cocaine: NOT DETECTED
Opiates: NOT DETECTED
Tetrahydrocannabinol: POSITIVE — AB

## 2015-03-21 LAB — PROTIME-INR
INR: 1.07 (ref 0.00–1.49)
Prothrombin Time: 14.1 seconds (ref 11.6–15.2)

## 2015-03-21 LAB — APTT: aPTT: 35 seconds (ref 24–37)

## 2015-03-21 MED ORDER — ASPIRIN EC 81 MG PO TBEC
81.0000 mg | DELAYED_RELEASE_TABLET | Freq: Every day | ORAL | Status: DC
  Administered 2015-03-21: 81 mg via ORAL

## 2015-03-21 MED ORDER — LOVASTATIN 40 MG PO TABS: 40.0000 mg | ORAL_TABLET | Freq: Every day | ORAL | Status: DC

## 2015-03-21 MED ORDER — SODIUM CHLORIDE 0.9 % IJ SOLN: 3.0000 mL | INTRAMUSCULAR | Status: DC | PRN

## 2015-03-21 MED ORDER — LISINOPRIL-HYDROCHLOROTHIAZIDE 20-25 MG PO TABS: 1.0000 | ORAL_TABLET | Freq: Every day | ORAL | Status: DC

## 2015-03-21 MED ORDER — ASPIRIN EC 325 MG PO TBEC: 325.0000 mg | DELAYED_RELEASE_TABLET | Freq: Every day | ORAL | Status: DC

## 2015-03-21 MED ORDER — ROSUVASTATIN CALCIUM 20 MG PO TABS
40.0000 mg | ORAL_TABLET | Freq: Every day | ORAL | Status: DC
  Administered 2015-03-21: 40 mg via ORAL
  Filled 2015-03-21: qty 2
  Filled 2015-03-21: qty 1

## 2015-03-21 MED ORDER — POTASSIUM CHLORIDE CRYS ER 20 MEQ PO TBCR
40.0000 meq | EXTENDED_RELEASE_TABLET | Freq: Once | ORAL | Status: AC
  Administered 2015-03-21: 40 meq via ORAL
  Filled 2015-03-21: qty 2

## 2015-03-21 MED ORDER — METOPROLOL TARTRATE 50 MG PO TABS: 50.0000 mg | ORAL_TABLET | Freq: Two times a day (BID) | ORAL | Status: DC

## 2015-03-21 MED ORDER — SODIUM CHLORIDE 0.9 % IJ SOLN
3.0000 mL | Freq: Two times a day (BID) | INTRAMUSCULAR | Status: DC
  Administered 2015-03-21: 3 mL via INTRAVENOUS

## 2015-03-21 MED ORDER — LISINOPRIL 10 MG PO TABS
20.0000 mg | ORAL_TABLET | Freq: Every day | ORAL | Status: DC
  Administered 2015-03-21: 20 mg via ORAL
  Filled 2015-03-21: qty 2

## 2015-03-21 MED ORDER — HYDROCHLOROTHIAZIDE 25 MG PO TABS
25.0000 mg | ORAL_TABLET | Freq: Every day | ORAL | Status: DC
  Administered 2015-03-21: 25 mg via ORAL
  Filled 2015-03-21: qty 1

## 2015-03-21 MED ORDER — METOPROLOL TARTRATE 50 MG PO TABS
50.0000 mg | ORAL_TABLET | Freq: Two times a day (BID) | ORAL | Status: DC
  Administered 2015-03-21: 50 mg via ORAL
  Filled 2015-03-21: qty 1

## 2015-03-21 MED ORDER — ASPIRIN EC 81 MG PO TBEC
81.0000 mg | DELAYED_RELEASE_TABLET | Freq: Every day | ORAL | Status: DC
  Filled 2015-03-21: qty 1

## 2015-03-21 MED ORDER — SODIUM CHLORIDE 0.9 % IV SOLN
250.0000 mL | INTRAVENOUS | Status: DC | PRN
  Administered 2015-03-21: 250 mL via INTRAVENOUS

## 2015-03-21 NOTE — Care Management Note (Signed)
Case Management Note  Patient Details  Name: Shane Valentine MRN: [id number] Date of Birth: May 30, 1966  Expected Discharge Date:                  Expected Discharge Plan:  Home/Self Care  In-House Referral:  Financial Counselor  Discharge planning Services  CM Consult  Post Acute Care Choice:  NA Choice offered to:  NA  DME Arranged:    DME Agency:     HH Arranged:    [place]:     Status of Service:  Completed, signed off  Medicare Important Message Given:    Date Medicare IM Given:    Medicare IM give by:    Date Additional Medicare IM Given:    Additional Medicare Important Message give by:     If discussed at [place] of Stay Meetings, dates discussed:    Additional Comments: Pt is from home and independent with ADL's. Pt is uninsured and referred to [facility]. Pt discharged home today. No CM needs.  Sherald Barge, RN 03/21/2015, 2:40 PM

## 2015-03-21 NOTE — H&P (Signed)
PCP:   Crisoforo Oxford, PA-C   Chief Complaint:  Arm numbness  HPI: 49 yo male h/o cva, htn very poor historian comes in with bilateral hand numbness.  When asked if he is still having the numbness he responds " i dont know, no not really but i cant describe it".  Denies neck pain.  noother neurological symptoms except numbness in both hands.  No recent illnesses.  No fevers.  No n/v/d.  Referred for admission to obtain mri brain and neck recommended by teleneuro service.  Review of Systems:  Positive and negative as per HPI otherwise all other systems are negative  Past Medical History: Past Medical History  Diagnosis Date  . Tuberculosis 1995    99mo therapy, [place]  . Hypertension   . GERD (gastroesophageal reflux disease)   . Hyperlipidemia   . Smoker   . Stroke    Past Surgical History  Procedure Laterality Date  . No past surgeries    . Inguinal hernia repair Left 03/29/2014    Procedure: LAPAROSCOPIC LEFT INGUINAL HERNIA REPAIR;  Surgeon: Ralene Ok, MD;  Location: [facility];  Service: General;  Laterality: Left;  . Insertion of mesh Left 03/29/2014    Procedure: INSERTION OF MESH;  Surgeon: Ralene Ok, MD;  Location: [facility];  Service: General;  Laterality: Left;    Medications: Prior to Admission medications   Medication Sig Start Date End Date Taking? Authorizing Provider  aspirin EC 81 MG tablet Take 1 tablet (81 mg total) by mouth daily. 01/07/15  Yes Camelia Eng Tysinger, PA-C  lisinopril-hydrochlorothiazide (PRINZIDE,ZESTORETIC) 20-25 MG per tablet Take 1 tablet by mouth daily. 12/12/14  Yes Camelia Eng Tysinger, PA-C  metoprolol (LOPRESSOR) 50 MG tablet Take 1 tablet (50 mg total) by mouth 2 (two) times daily. 01/07/15  Yes Camelia Eng Tysinger, PA-C  rosuvastatin (CRESTOR) 40 MG tablet Take 1 tablet (40 mg total) by mouth daily. 01/15/15  Yes Camelia Eng Tysinger, PA-C  nicotine (NICODERM CQ - DOSED IN MG/24 HOURS) 21 mg/24hr patch Place 1 patch (21 mg total) onto the  skin daily. 01/08/15   Camelia Eng Tysinger, PA-C  oxyCODONE-acetaminophen (ROXICET) 5-325 MG per tablet Take 1-2 tablets by mouth every 4 (four) hours as needed. Patient not taking: Reported on 08/29/2014 03/29/14   Ralene Ok, MD    Allergies:  No Known Allergies  Social History:  reports that he has been smoking.  He does not have any smokeless tobacco history on file. He reports that he uses illicit drugs (Marijuana). He reports that he does not drink alcohol.  Family History: History reviewed. No pertinent family history.  Physical Exam: Filed Vitals:   03/21/15 0315 03/21/15 0330 03/21/15 0345 03/21/15 0400  BP:  143/86  146/99  Pulse: 69 63 63 61  Temp:      TempSrc:      Resp: 15 19 19 18   Height:      Weight:      SpO2: 97% 97% 94% 100%   General appearance: alert, cooperative and no distress Head: Normocephalic, without obvious abnormality, atraumatic Eyes: negative Nose: Nares normal. Septum midline. Mucosa normal. No drainage or sinus tenderness. Neck: no JVD and supple, symmetrical, trachea midline Lungs: clear to auscultation bilaterally Heart: regular rate and rhythm, S1, S2 normal, no murmur, click, rub or gallop Abdomen: soft, non-tender; bowel sounds normal; no masses,  no organomegaly Extremities: extremities normal, atraumatic, no cyanosis or edema Pulses: 2+ and symmetric Skin: Skin color, texture, turgor normal. No rashes  or lesions Neurologic: Grossly normal    Labs on Admission:   Recent Labs  03/20/15 2336  NA 138  K 2.9*  CL 107  CO2 26  GLUCOSE 143*  BUN 19  CREATININE 1.14  CALCIUM 8.6*    Recent Labs  03/20/15 2336  AST 24  ALT 11*  ALKPHOS 74  BILITOT 0.5  PROT 6.7  ALBUMIN 4.0    Recent Labs  03/20/15 2336  WBC 11.4*  NEUTROABS 8.4*  HGB 12.3*  HCT 36.7*  MCV 93.9  PLT 215    Recent Labs  03/20/15 2336  TROPONINI <0.03   Radiological Exams on Admission: Ct Head Wo Contrast  03/21/2015   CLINICAL DATA:   Constant throbbing headache. Left arm weakness and paresthesias. Duration 3.5 hours.  EXAM: CT HEAD WITHOUT CONTRAST  TECHNIQUE: Contiguous axial images were obtained from the base of the skull through the vertex without intravenous contrast.  COMPARISON:  None.  FINDINGS: There is no intracranial hemorrhage, mass or evidence of acute infarction. There is no extra-axial fluid collection. Gray matter and white matter appear normal. Cerebral volume is normal for age. Brainstem and posterior fossa are unremarkable. The CSF spaces appear normal.  The bony structures are intact. There is severe chronic sinusitis involving the right frontal, ethmoid and maxillary sinuses. These sinuses are completely opacified and exhibit moderate bony sclerosis and bony thickening in thickened of of chronicity.  IMPRESSION: 1. Normal brain 2. Severe chronic right frontal, ethmoid and maxillary sinusitis   Electronically Signed   By: Andreas Newport M.D.   On: 03/21/2015 01:05   ekg reviewed nsr, no acute issues  Assessment/Plan  49 yo male with h/o TIA/CVA, htn, hld with bilateral hand numbness  Principal Problem:   Bilateral hand numbness-  Obtain mri brain and neck in am.  If brain imaging positive for new stroke, proceed with full cva work up.  May be cervical spine origin.  Symptoms seemed to have resolved but difficult to tell with pt.  Active Problems:   Hyperlipidemia-  noted   Essential hypertension- stable   Hypokalemia- given oral kcl in am.  Repeat k in am.  obs on tele bed.  Full code.  DAVID,RACHAL A 03/21/2015, 4:16 AM

## 2015-03-21 NOTE — Telephone Encounter (Signed)
Pt was called concerning recent hospitalization. He states he was released after lunch today. He states that he went in because he had a big headache. He states he is feeling better now. When asked he stated that he was given a new medication, Lovastatin. He states that he has not gotten if filled yet because it was sent to [facility] on [address] and he needed to [facility] in [place]. He also stated he needed all his meds sent there. He stated he was given Lovastatin in the the hospital without any issues. I informed pt that I would call [facility] and have his medications sent to the one in [place]. Pt was reminded of his hospital follow up appt 04/04/2015 and told if he had any questions or concerns he could call the office. Pt verbalized understanding. After conversation with pt I called the [facility] in [place] and had his medication switched. I then called pt back and informed him I had taken care of it and he stated he would go pick up his meds in about an hour.

## 2015-03-21 NOTE — ED Notes (Signed)
Paged [place] for Dr. Tomi Bamberger

## 2015-03-21 NOTE — Progress Notes (Signed)
PROGRESS NOTE  Shane Valentine YQI:[id number] DOB: 01/09/1966 DOA: 03/20/2015 PCP: Crisoforo Oxford, PA-C  Summary: 49 yo male with history of  CVA/TIA, HTN complains of bilateral hand numbness. CT head with contrast revealed severe chronic right frontal, ethmoid and maxillary sinusitis but otherwise unremarkable. Referred for admission to obtain MRI brain and neck recommended by teleneuro service.  Assessment/Plan: 1. Bilateral hand numbness. Resolved. Has some hand soreness. No new focal deficits. Consider TIA, but doubt, more likely related to working on car yesterday. CT head reveals severe chronic right frontal, ethmoid and maxillary sinusitisis but otherwise unremarkable. No follow up needed per PT and OT. Echo and carotid u/s unremarkable. 2. Hyperlipidemia. LDL 132. Continue statin. 3. HTN. Elevated. Continue metoprolol, lisinopril, HCTZ 4. Hypokalemia. Continue repletion.   Symptoms resolved. Complete stroke evaluation. History more suggestive of overuse phenomenon rather than acute neurovascular process. Doubt TIA/stroke.  Contine aspirin, statin.  F/u MRI brain, MRA head  Anticipate discharge later today if imaging unremarkable.  Code Status: Full DVT prophylaxis: SCDs Family discussion: Dicussed plan in detail. No further concerns at this time. Disposition Plan: Discharge today  Murray Hodgkins, MD  [facility]  Pager [phone] If 7PM-7AM, please contact night-coverage at [email or web address], password [facility] 03/21/2015, 6:13 AM    Consultants:  None  Procedures:  ECHO Study Conclusions  - Left ventricle: The cavity size was normal. Wall thickness was increased increased in a pattern of mild to moderate LVH. Systolic function was normal. The estimated ejection fraction was in the range of 60% to 65%. Wall motion was normal; there were no regional wall motion abnormalities. Left ventricular diastolic function parameters were normal. - Aortic valve:  There was moderate regurgitation. The AI vena contracta is 0.4 cm. Valve area (VTI): 3.71 cm^2. Valve area (Vmax): 3.66 cm^2. Valve area (Vmean): 4.2 cm^2. - Atrial septum: No defect or patent foramen ovale was identified. - Technically adequate study.  Antibiotics:  None  HPI/Subjective: Feeling okay. Bilateral hand soreness. Denies numbness or radiation to his arms, dysphagia, dysarthria or difficulty ambulating. Reports weakness in left arm secondary to CVA in June 2016, no change. Missed BP medication 2 days ago. States that onset of hand numbness after he was working on a yesterday 9/1  Objective: Filed Vitals:   03/21/15 0330 03/21/15 0345 03/21/15 0400 03/21/15 0448  BP: 143/86  146/99 162/96  Pulse: 63 63 61 68  Temp:      TempSrc:      Resp: 19 19 18 20   Height:    5\' 10"  (1.778 m)  Weight:    77.565 kg (171 lb)  SpO2: 97% 94% 100% 100%   No intake or output data in the 24 hours ending 03/21/15 0613   Filed Weights   03/20/15 2324 03/21/15 0448  Weight: 77.565 kg (171 lb) 77.565 kg (171 lb)    Exam:     Afebrile General:  Appears calm and comfortable Eyes: PERRL, normal lids, irises & conjunctiva ENT: grossly normal hearing, lips & tongue Neck: no LAD, masses or thyromegaly Cardiovascular: RRR, no m/r/g. No LE edema. Telemetry: SR, no arrhythmias  Respiratory: CTA bilaterally, no w/r/r. Normal respiratory effort. Abdomen: soft, ntnd Musculoskeletal: 5/5 LLE, RUE. 4+/5 LUE Psychiatric: grossly normal mood and affect, speech fluent and appropriate Neurologic: CN 2-12 intact. No pronator drift. No pass pointing. Normal heel to chin  New data reviewed:  BMP unremarkable except potassium 3.3  Carotid u/s IMPRESSION: 1. Early bilateral carotid bifurcation plaque resulting in less than 50% diameter stenosis.  The exam does not exclude plaque ulceration or embolization. Continued surveillance recommended.  Pertinent data since admission:  CT head with  contrast revealed severe chronic right frontal, ethmoid and maxillary sinusitis but otherwise unremarkable.  Pending data:    Scheduled Meds: . aspirin EC  81 mg Oral Daily  . aspirin EC  81 mg Oral Daily  . lisinopril  20 mg Oral Daily   And  . hydrochlorothiazide  25 mg Oral Daily  . metoprolol  50 mg Oral BID  . rosuvastatin  40 mg Oral Daily  . sodium chloride  3 mL Intravenous Q12H  . sodium chloride  3 mL Intravenous Q12H   Continuous Infusions:   Principal Problem:   Bilateral hand numbness Active Problems:   Hyperlipidemia   Essential hypertension   Hypokalemia   Hypertension    I, Rhett Bannister, acting a scribe, recorded this note contemporaneously in the presence of Dr. Kathie Dike, M.D. on 03/21/2015 at 6:21 AM   I have reviewed the above documentation for accuracy and completeness, and I agree with the above. Murray Hodgkins, MD

## 2015-03-21 NOTE — Telephone Encounter (Signed)
This patient was recently hospitalized.   Please call patient about hospital continuity of care.    See how they are doing. Review any medication changes per discharge summary. Schedule follow up appointment and make note on schedule that this is hospital f/u continuity of care visit.   

## 2015-03-21 NOTE — Discharge Summary (Signed)
Physician Discharge Summary  Shane Valentine LEX:[id number] DOB: 1965-12-02 DOA: 03/20/2015  PCP: Crisoforo Oxford, PA-C  Admit date: 03/20/2015 Discharge date: 03/21/2015  Recommendations for Outpatient Follow-up:  1. Follow up with PCP for resolution of symptoms and to discuss elevated LDL 2. Continue ASA  3. Restart statin (was not taking Crestor as an outpatient)  Discharge Diagnoses:  1. Bilateral hand numbness.  2. Hyperlipidemia.  3. HTN.  4. Tobacco dependency.   Discharge Condition: Improved  Disposition: Discharge home  Diet recommendation: Heart healthy  Filed Weights   03/20/15 2324 03/21/15 0448  Weight: 77.565 kg (171 lb) 77.565 kg (171 lb)    History of present illness:  49 yo male with history of CVA/TIA, HTN complains of bilateral hand numbness. CT head with contrast revealed severe chronic right frontal, ethmoid and maxillary sinusitis but otherwise unremarkable. Referred for admission to obtain MRI brain and neck recommended by teleneuro service.   Hospital Course:  Bilateral hand numbness resolved, still has mild soreness. TIA was considered but given unremarkable exam, imaging and labs, it is doubted; most likely secondary to overuse phenomenon from working on car.  Per PT and OT there is no need for follow up. Recommended to maintain ASA and restart statin regimen. All other issues remained stable during hospitalization. Patient did not tolerate MRI so it was deferred; given hx most suggestive of overuse and not TIA. Discussed smoking cessation in detail with patient, he is not ready.  Individual issues as below:   1. Bilateral hand numbness. Resolved. Has some hand soreness. No new focal deficits. Consider TIA, but doubt, more likely related to working on car yesterday. CT head reveals severe chronic right frontal, ethmoid and maxillary sinusitisis but otherwise unremarkable. No follow up needed per PT and OT. Echo and carotid u/s unremarkable. Couldn't  tolerate MRI secondary to claustrophobia, doesn't want to pursue. 2. Hyperlipidemia. LDL 132. Continue statin. 3. HTN. Elevated. Continue metoprolol, lisinopril, HCTZ 4. Hypokalemia. Continue repletion. 5. Tobacco dependency. Discussed smoking cessation in detail.   Consultants:  None  Procedures:  ECHO Study Conclusions  - Left ventricle: The cavity size was normal. Wall thickness was increased increased in a pattern of mild to moderate LVH. Systolic function was normal. The estimated ejection fraction was in the range of 60% to 65%. Wall motion was normal; there were no regional wall motion abnormalities. Left ventricular diastolic function parameters were normal. - Aortic valve: There was moderate regurgitation. The AI vena contracta is 0.4 cm. Valve area (VTI): 3.71 cm^2. Valve area (Vmax): 3.66 cm^2. Valve area (Vmean): 4.2 cm^2. - Atrial septum: No defect or patent foramen ovale was identified. - Technically adequate study.  Antibiotics:  None  Discharge Instructions Discharge Instructions    Activity as tolerated - No restrictions    Complete by:  As directed      Diet - low sodium heart healthy    Complete by:  As directed      Discharge instructions    Complete by:  As directed   Call your physician or seek immediate medical attention for weakness, numbness, difficulty speaking, swallowing or worsening of condition.            Discharge Medication List as of 03/21/2015 12:26 PM    START taking these medications   Details  lovastatin (MEVACOR) 40 MG tablet Take 1 tablet (40 mg total) by mouth at bedtime., Starting 03/21/2015, Until Discontinued, Normal      CONTINUE these medications which have CHANGED  Details  aspirin EC 325 MG tablet Take 1 tablet (325 mg total) by mouth daily., Starting 03/21/2015, Until Discontinued, No Print    lisinopril-hydrochlorothiazide (PRINZIDE,ZESTORETIC) 20-25 MG per tablet Take 1 tablet by mouth daily., Starting  03/21/2015, Until Discontinued, Normal    metoprolol (LOPRESSOR) 50 MG tablet Take 1 tablet (50 mg total) by mouth 2 (two) times daily., Starting 03/21/2015, Until Discontinued, Normal      STOP taking these medications     rosuvastatin (CRESTOR) 40 MG tablet        No Known Allergies  The results of significant diagnostics from this hospitalization (including imaging, microbiology, ancillary and laboratory) are listed below for reference.    Significant Diagnostic Studies: Ct Head Wo Contrast  03/21/2015   CLINICAL DATA:  Constant throbbing headache. Left arm weakness and paresthesias. Duration 3.5 hours.  EXAM: CT HEAD WITHOUT CONTRAST  TECHNIQUE: Contiguous axial images were obtained from the base of the skull through the vertex without intravenous contrast.  COMPARISON:  None.  FINDINGS: There is no intracranial hemorrhage, mass or evidence of acute infarction. There is no extra-axial fluid collection. Gray matter and white matter appear normal. Cerebral volume is normal for age. Brainstem and posterior fossa are unremarkable. The CSF spaces appear normal.  The bony structures are intact. There is severe chronic sinusitis involving the right frontal, ethmoid and maxillary sinuses. These sinuses are completely opacified and exhibit moderate bony sclerosis and bony thickening in thickened of of chronicity.  IMPRESSION: 1. Normal brain 2. Severe chronic right frontal, ethmoid and maxillary sinusitis   Electronically Signed   By: Andreas Newport M.D.   On: 03/21/2015 01:05   US Carotid Bilateral  03/21/2015   CLINICAL DATA:  STROKE, BILATERAL HAND NUMBNESS. HYPERTENSION, PREVIOUS TOBACCO ABUSE  EXAM: BILATERAL CAROTID DUPLEX ULTRASOUND  TECHNIQUE: Pearline Cables scale imaging, color Doppler and duplex ultrasound was performed of bilateral carotid and vertebral arteries in the neck.  COMPARISON:  None.  REVIEW OF SYSTEMS: Quantification of carotid stenosis is based on velocity parameters that correlate the  residual internal carotid diameter with NASCET-based stenosis levels, using the diameter of the distal internal carotid lumen as the denominator for stenosis measurement.  The following velocity measurements were obtained:  PEAK SYSTOLIC/END DIASTOLIC  RIGHT  ICA:                     64/33cm/sec  CCA:                     40/97DZ/HGD  SYSTOLIC ICA/CCA RATIO:  1.0  DIASTOLIC ICA/CCA RATIO: 1.8  ECA:                     70cm/sec  LEFT  ICA:                     73/35cm/sec  CCA:                     92/42AS/TMH  SYSTOLIC ICA/CCA RATIO:  1.0  DIASTOLIC ICA/CCA RATIO: 2.4  ECA:                     66cm/sec  FINDINGS: RIGHT CAROTID ARTERY: Eccentric smooth plaque in the bulb extending to the ICA origin. No high-grade stenosis. Normal waveforms and color Doppler signal.  RIGHT VERTEBRAL ARTERY:  Normal flow direction and waveform.  LEFT CAROTID ARTERY: Smooth eccentric plaque in the carotid bulb extending to the ICA origin.  No high-grade stenosis. Normal waveforms and color Doppler signal.  LEFT VERTEBRAL ARTERY: Normal flow direction and waveform.  IMPRESSION: 1. Early bilateral carotid bifurcation plaque resulting in less than 50% diameter stenosis. The exam does not exclude plaque ulceration or embolization. Continued surveillance recommended.   Electronically Signed   By: Lucrezia Europe M.D.   On: 03/21/2015 11:18    Microbiology:  Labs: Basic Metabolic Panel:  Recent Labs Lab 03/20/15 2336 03/21/15 0451  NA 138 137  K 2.9* 3.3*  CL 107 107  CO2 26 27  GLUCOSE 143* 98  BUN 19 15  CREATININE 1.14 1.03  CALCIUM 8.6* 8.3*   Liver Function Tests:  Recent Labs Lab 03/20/15 2336  AST 24  ALT 11*  ALKPHOS 74  BILITOT 0.5  PROT 6.7  ALBUMIN 4.0   CBC:  Recent Labs Lab 03/20/15 2336  WBC 11.4*  NEUTROABS 8.4*  HGB 12.3*  HCT 36.7*  MCV 93.9  PLT 215   Cardiac Enzymes:  Recent Labs Lab 03/20/15 2336  TROPONINI <0.03    Principal Problem:   Bilateral hand numbness Active  Problems:   Hyperlipidemia   Essential hypertension   Hypokalemia   Hypertension   Time coordinating discharge: 35 minutes  I, Rhett Bannister, acting a scribe, recorded this note contemporaneously in the presence of Dr. Kathie Dike, M.D. on 03/21/2015 at 11:54 AM   I have reviewed the above documentation for accuracy and completeness, and I agree with the above. Murray Hodgkins, MD      Signed:  Murray Hodgkins, MD [facility] 03/21/2015, 11:54 AM

## 2015-03-21 NOTE — Evaluation (Signed)
Physical Therapy Evaluation Patient Details Name: Shane Valentine MRN: [id number] DOB: [birth year] Today's Date: 03/21/2015   History of Present Illness  yo male h/o cva, htn very poor historian comes in with bilateral hand numbness. When asked if he is still having the numbness he responds " i dont know, no not really but i cant describe it". Denies neck pain. noother neurological symptoms except numbness in both hands. No recent illnesses. No fevers. No n/v/d. Referred for admission to obtain mri brain and neck recommended by teleneuro service.yo male h/o cva, htn very poor historian comes in with bilateral hand numbness. When asked if he is still having the numbness he responds " i dont know, no not really but i cant describe it". Denies neck pain. noother neurological symptoms except numbness in both hands. No recent illnesses. No fevers. No n/v/d. Referred for admission to obtain mri brain and neck recommended by teleneuro service.  Clinical Impression   Pt was seen for evaluation.  He now reports no significant hand numbness.  They are just "sore" from working on his car radiator yesterday.  Pt lives alone and has recently taken a new job.  No abnormalities were seen on evaluation.  He should have no further PT needs.    Follow Up Recommendations No PT follow up    Equipment Recommendations  None recommended by PT    Recommendations for Other Services   none    Precautions / Restrictions Precautions Precautions: None Restrictions Weight Bearing Restrictions: No      Mobility  Bed Mobility Overal bed mobility: Independent                Transfers Overall transfer level: Independent                  Ambulation/Gait Ambulation/Gait assistance: Independent Ambulation Distance (Feet): 150 Feet Assistive device: None Gait Pattern/deviations: WFL(Within Functional Limits)   Gait velocity interpretation: >2.62 ft/sec, indicative of independent community  Financial controller    Modified Rankin (Stroke Patients Only) Modified Rankin (Stroke Patients Only) Pre-Morbid Rankin Score: No symptoms Modified Rankin: No symptoms     Balance Overall balance assessment: Independent                                           Pertinent Vitals/Pain Pain Assessment: No/denies pain    Home Living Family/patient expects to be discharged to:: Private residence Living Arrangements: Alone   Type of Home: House Home Access: Stairs to enter Entrance Stairs-Rails: None Entrance Stairs-Number of Steps: 2 Home Layout: One level Home Equipment: Cane - single point      Prior Function Level of Independence: Independent               Hand Dominance   Dominant Hand: Left    Extremity/Trunk Assessment   Upper Extremity Assessment: Defer to OT evaluation           Lower Extremity Assessment: Overall WFL for tasks assessed      Cervical / Trunk Assessment: Normal  Communication   Communication: No difficulties  Cognition Arousal/Alertness: Awake/alert Behavior During Therapy: WFL for tasks assessed/performed Overall Cognitive Status: Within Functional Limits for tasks assessed  General Comments      Exercises        Assessment/Plan    PT Assessment Patent does not need any further PT services  PT Diagnosis     PT Problem List    PT Treatment Interventions     PT Goals (Current goals can be found in the Care Plan section)      Frequency     Barriers to discharge        Co-evaluation               End of Session Equipment Utilized During Treatment: Gait belt Activity Tolerance: Patient tolerated treatment well Patient left: in bed;with call bell/phone within reach Nurse Communication: Mobility status         Time: 2694-8546 PT Time Calculation (min) (ACUTE ONLY): 23 min   Charges:   PT Evaluation $Initial PT  Evaluation Tier I: 1 Procedure     PT G CodesDemetrios Isaacs L  PT 03/21/2015, 8:52 AM [phone]

## 2015-03-21 NOTE — Progress Notes (Signed)
Merril Abbe discharged Home per MD order.  Discharge instructions reviewed and discussed with the patient, all questions and concerns answered. Copy of instructions and scripts given to patient.    Medication List    STOP taking these medications        rosuvastatin 40 MG tablet  Commonly known as:  CRESTOR      TAKE these medications        aspirin EC 325 MG tablet  Take 1 tablet (325 mg total) by mouth daily.     lisinopril-hydrochlorothiazide 20-25 MG per tablet  Commonly known as:  PRINZIDE,ZESTORETIC  Take 1 tablet by mouth daily.     lovastatin 40 MG tablet  Commonly known as:  MEVACOR  Take 1 tablet (40 mg total) by mouth at bedtime.     metoprolol 50 MG tablet  Commonly known as:  LOPRESSOR  Take 1 tablet (50 mg total) by mouth 2 (two) times daily.        Patients skin is clean, dry and intact, no evidence of skin break down. IV site discontinued and catheter remains intact. Site without signs and symptoms of complications. Dressing and pressure applied.  Patient escorted to car by RNin a wheelchair,  no distress noted upon discharge.  Polly Cobia 03/21/2015 12:43 PM

## 2015-03-21 NOTE — Progress Notes (Signed)
OT Screen  Patient Details Name: Shane Valentine MRN: [id number] DOB: [birth year]   OT Screen:      Reason evaluation not completed: Pt reports his hands are feeling better now, however a sore feeling remains.  Pt believes soreness may be from working on his car yesterday. Pt reports LUE deficits at baseline, including decreased grip strength, following stroke in June 2016. Pt is at baseline for ADL tasks, no further acute OT needs.   Guadelupe Sabin, OTR/L  [phone]  03/21/2015, 9:26 AM

## 2015-04-04 ENCOUNTER — Inpatient Hospital Stay: Payer: Self-pay | Admitting: Medical

## 2015-04-10 ENCOUNTER — Telehealth: Payer: Self-pay | Admitting: Medical

## 2015-04-10 NOTE — Telephone Encounter (Signed)
Pt was called concerning recent no show. Pt stated that he is doing ok but missed the appt because he cant afford it. He states that he has lost his insurance and is trying to get medicaid. Pt states as soon as he gets insurance he will call and make an appt. He states that hospital made appt.

## 2015-04-10 NOTE — Telephone Encounter (Signed)
I'm sure you told to CALL next time instead of just no showing as this leads to dismissal

## 2015-04-10 NOTE — Telephone Encounter (Signed)
Yes I did

## 2015-06-20 IMAGING — CR DG CHEST 2V
2 series · 2 of 2 positions shown · non-contrast
Comparison: None.

CLINICAL DATA: The abnormal thyroid labs.  Smoker.  Hypertension.

EXAM:
CHEST  2 VIEW

[w chest pa]
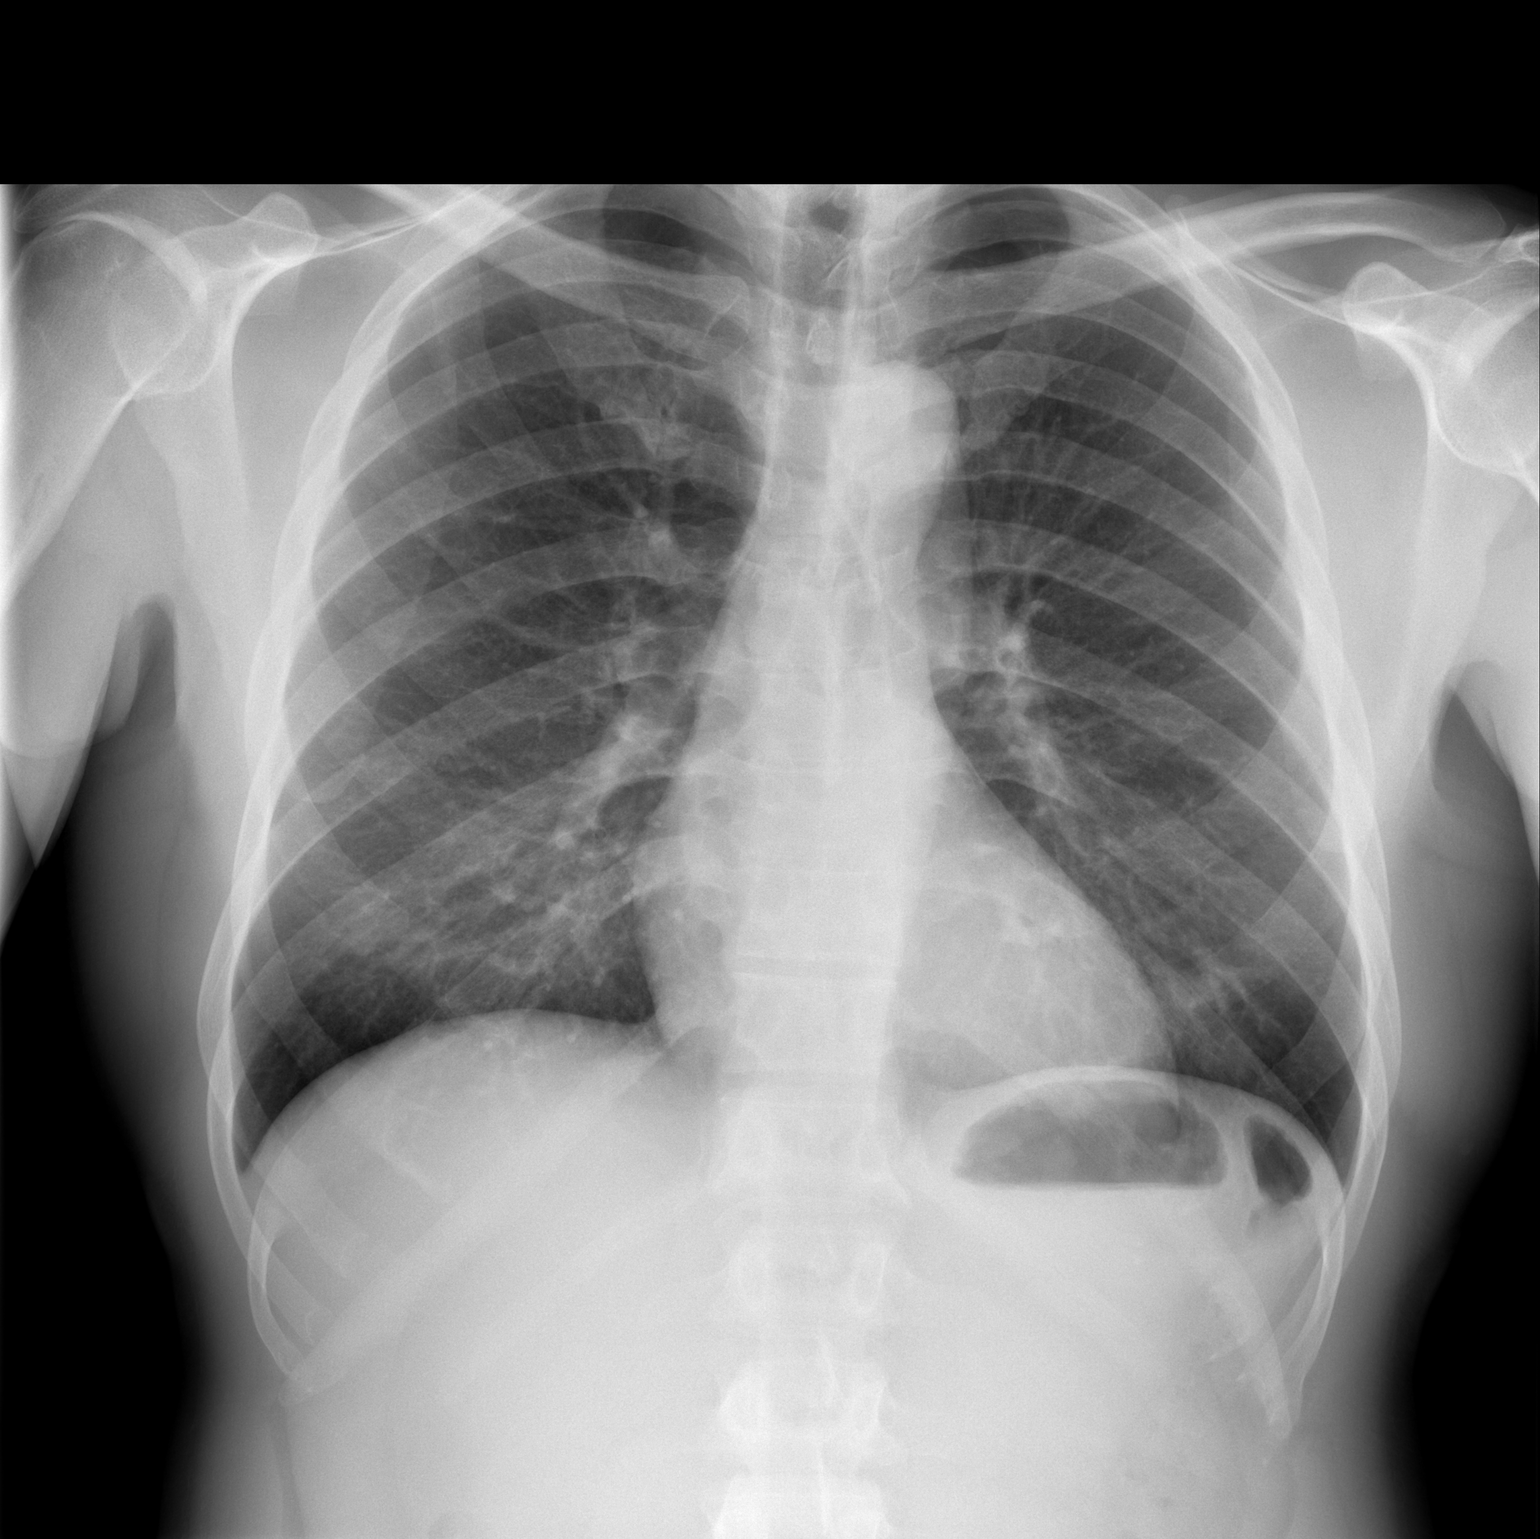

[w chest lat]
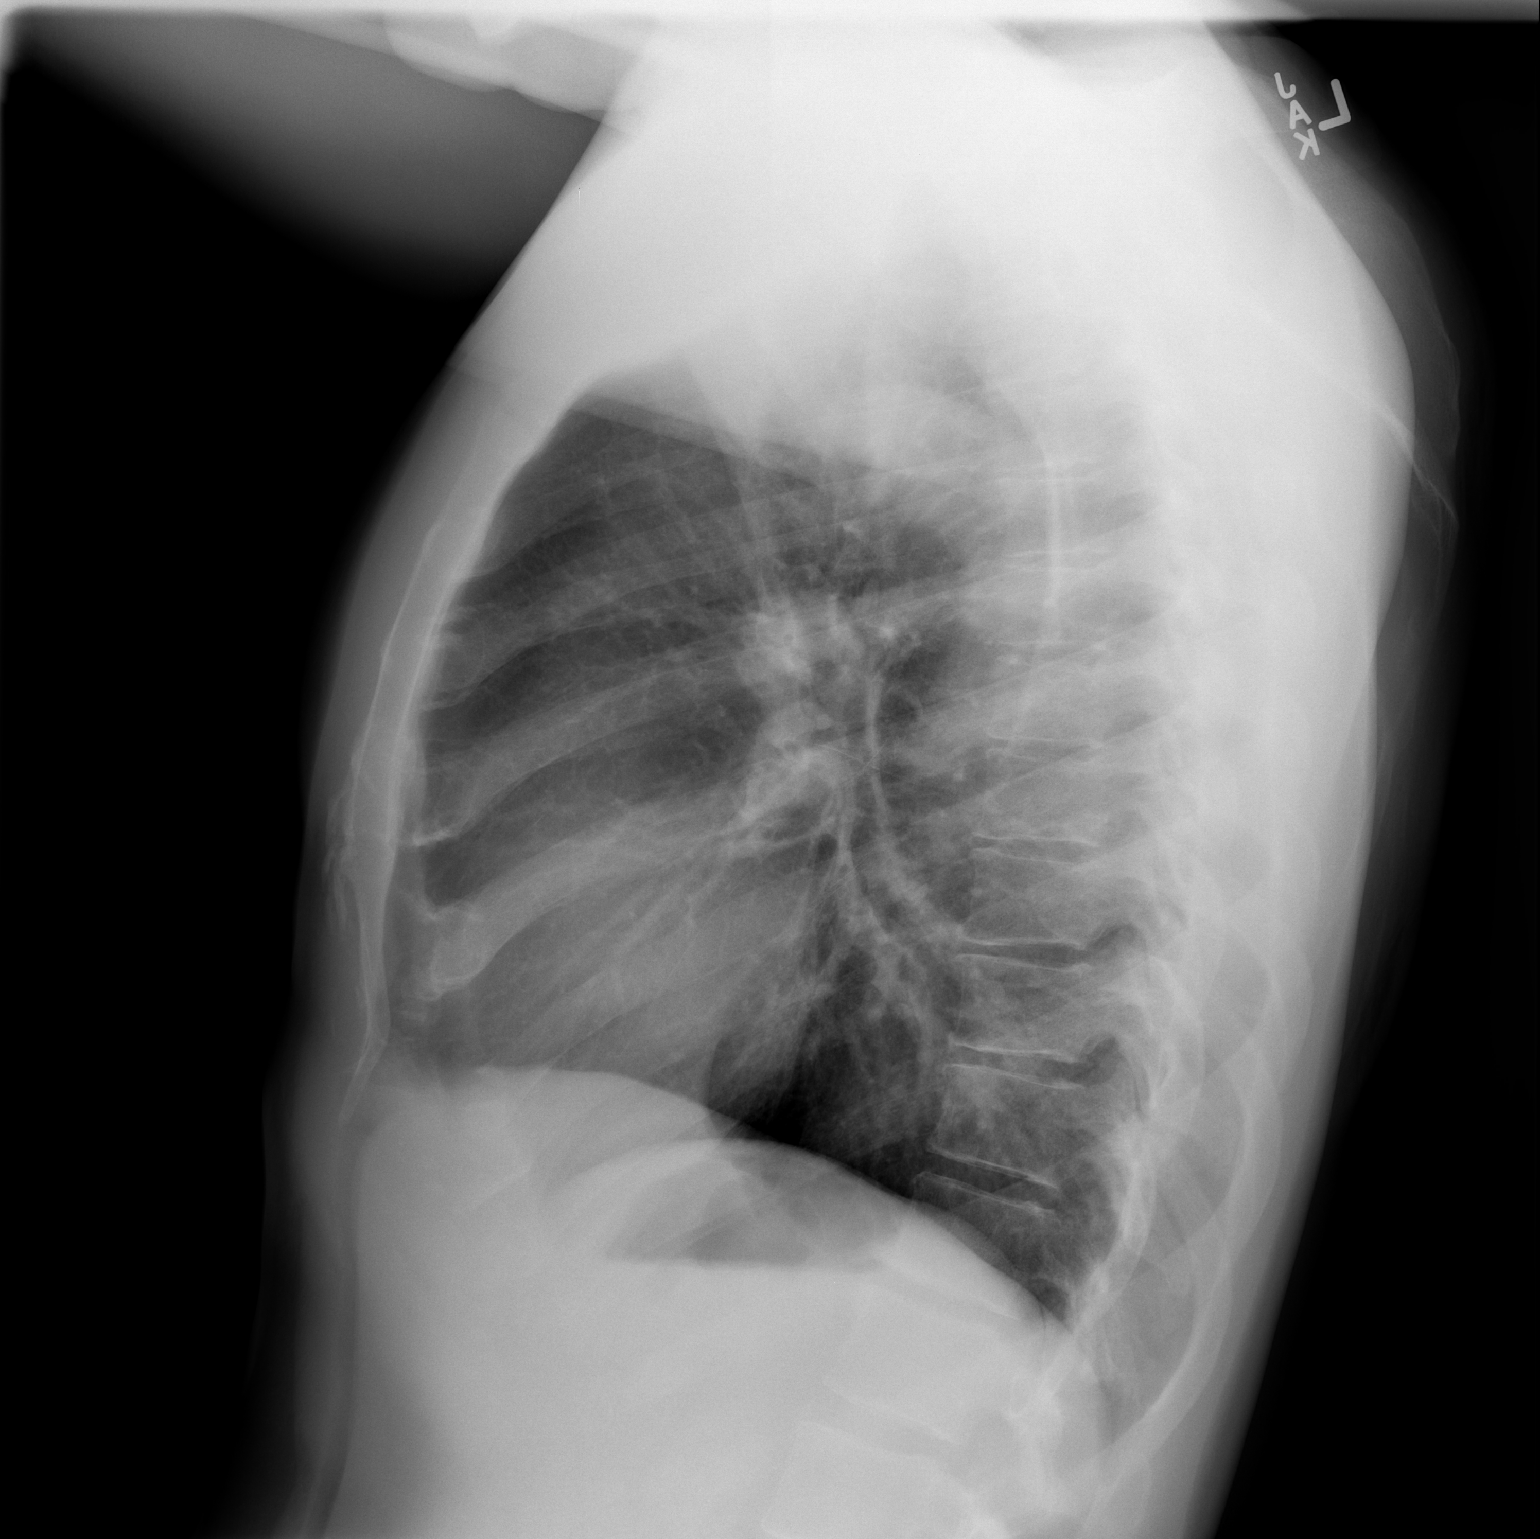

[2 of 2 positions shown; findings below may reference images not displayed]

FINDINGS: The heart size and mediastinal contours are within normal limits.
Both lungs are clear. The visualized skeletal structures are
unremarkable.
IMPRESSION: No active cardiopulmonary disease.

## 2015-08-29 ENCOUNTER — Encounter (HOSPITAL_COMMUNITY): Payer: Self-pay

## 2015-08-29 ENCOUNTER — Emergency Department (HOSPITAL_COMMUNITY)
Admission: EM | Admit: 2015-08-29 | Discharge: 2015-08-29 | Disposition: A | Discharge status: Home / Self Care | Payer: MEDICAID | Attending: Emergency Medicine | Admitting: Emergency Medicine

## 2015-08-29 DIAGNOSIS — IMO0001 Reserved for inherently not codable concepts without codable children: Secondary | ICD-10-CM

## 2015-08-29 DIAGNOSIS — Z7982 Long term (current) use of aspirin: Secondary | ICD-10-CM | POA: Insufficient documentation

## 2015-08-29 DIAGNOSIS — F17210 Nicotine dependence, cigarettes, uncomplicated: Secondary | ICD-10-CM | POA: Insufficient documentation

## 2015-08-29 DIAGNOSIS — Z8673 Personal history of transient ischemic attack (TIA), and cerebral infarction without residual deficits: Secondary | ICD-10-CM | POA: Insufficient documentation

## 2015-08-29 DIAGNOSIS — Z79899 Other long term (current) drug therapy: Secondary | ICD-10-CM | POA: Insufficient documentation

## 2015-08-29 DIAGNOSIS — Z8719 Personal history of other diseases of the digestive system: Secondary | ICD-10-CM | POA: Insufficient documentation

## 2015-08-29 DIAGNOSIS — Z8611 Personal history of tuberculosis: Secondary | ICD-10-CM | POA: Insufficient documentation

## 2015-08-29 DIAGNOSIS — E785 Hyperlipidemia, unspecified: Secondary | ICD-10-CM | POA: Insufficient documentation

## 2015-08-29 DIAGNOSIS — I10 Essential (primary) hypertension: Secondary | ICD-10-CM | POA: Insufficient documentation

## 2015-08-29 DIAGNOSIS — R030 Elevated blood-pressure reading, without diagnosis of hypertension: Secondary | ICD-10-CM

## 2015-08-29 HISTORY — DX: Paresthesia of skin: R20.2

## 2015-08-29 HISTORY — DX: Headache, unspecified: R51.9

## 2015-08-29 HISTORY — DX: Headache: R51

## 2015-08-29 MED ORDER — HYDROCHLOROTHIAZIDE 25 MG PO TABS
25.0000 mg | ORAL_TABLET | Freq: Every day | ORAL | Status: DC
  Administered 2015-08-29: 25 mg via ORAL
  Filled 2015-08-29: qty 1

## 2015-08-29 MED ORDER — LISINOPRIL-HYDROCHLOROTHIAZIDE 20-25 MG PO TABS: 1.0000 | ORAL_TABLET | Freq: Every day | ORAL | Status: DC

## 2015-08-29 MED ORDER — METOPROLOL TARTRATE 50 MG PO TABS
50.0000 mg | ORAL_TABLET | Freq: Once | ORAL | Status: AC
  Administered 2015-08-29: 50 mg via ORAL
  Filled 2015-08-29: qty 1

## 2015-08-29 MED ORDER — LISINOPRIL 10 MG PO TABS
20.0000 mg | ORAL_TABLET | Freq: Once | ORAL | Status: AC
  Administered 2015-08-29: 20 mg via ORAL
  Filled 2015-08-29: qty 2

## 2015-08-29 MED ORDER — METOPROLOL TARTRATE 50 MG PO TABS: 50.0000 mg | ORAL_TABLET | Freq: Two times a day (BID) | ORAL | Status: DC

## 2015-08-29 NOTE — ED Notes (Signed)
Pt [facility] to get some dental work done and bp was 220/120 manually, ems says bp was 210/150.  Pt c/o "slight" headache.  Reports has had headache x 3days.  HR 100, 100% on room air.  Pt says is supposed to be on bp medication but ran out and can't afford to get anymore.

## 2015-08-29 NOTE — Discharge Instructions (Signed)
Hypertension Hypertension, commonly called high blood pressure, is when the force of blood pumping through your arteries is too strong. Your arteries are the blood vessels that carry blood from your heart throughout your body. A blood pressure reading consists of a higher number over a lower number, such as 110/72. The higher number (systolic) is the pressure inside your arteries when your heart pumps. The lower number (diastolic) is the pressure inside your arteries when your heart relaxes. Ideally you want your blood pressure below 120/80. Hypertension forces your heart to work harder to pump blood. Your arteries may become narrow or stiff. Having untreated or uncontrolled hypertension can cause heart attack, stroke, kidney disease, and other problems. RISK FACTORS Some risk factors for high blood pressure are controllable. Others are not.  Risk factors you cannot control include:   Race. You may be at higher risk if you are African American.  Age. Risk increases with age.  Gender. Men are at higher risk than women before age 45 years. After age 65, women are at higher risk than men. Risk factors you can control include:  Not getting enough exercise or physical activity.  Being overweight.  Getting too much fat, sugar, calories, or salt in your diet.  Drinking too much alcohol. SIGNS AND SYMPTOMS Hypertension does not usually cause signs or symptoms. Extremely high blood pressure (hypertensive crisis) may cause headache, anxiety, shortness of breath, and nosebleed. DIAGNOSIS To check if you have hypertension, your health care provider will measure your blood pressure while you are seated, with your arm held at the level of your heart. It should be measured at least twice using the same arm. Certain conditions can cause a difference in blood pressure between your right and left arms. A blood pressure reading that is higher than normal on one occasion does not mean that you need treatment. If  it is not clear whether you have high blood pressure, you may be asked to return on a different day to have your blood pressure checked again. Or, you may be asked to monitor your blood pressure at home for 1 or more weeks. TREATMENT Treating high blood pressure includes making lifestyle changes and possibly taking medicine. Living a healthy lifestyle can help lower high blood pressure. You may need to change some of your habits. Lifestyle changes may include:  Following the DASH diet. This diet is high in fruits, vegetables, and whole grains. It is low in salt, red meat, and added sugars.  Keep your sodium intake below 2,300 mg per day.  Getting at least 30-45 minutes of aerobic exercise at least 4 times per week.  Losing weight if necessary.  Not smoking.  Limiting alcoholic beverages.  Learning ways to reduce stress. Your health care provider may prescribe medicine if lifestyle changes are not enough to get your blood pressure under control, and if one of the following is true:  You are 18-59 years of age and your systolic blood pressure is above 140.  You are 60 years of age or older, and your systolic blood pressure is above 150.  Your diastolic blood pressure is above 90.  You have diabetes, and your systolic blood pressure is over 140 or your diastolic blood pressure is over 90.  You have kidney disease and your blood pressure is above 140/90.  You have heart disease and your blood pressure is above 140/90. Your personal target blood pressure may vary depending on your medical conditions, your age, and other factors. HOME CARE INSTRUCTIONS    Have your blood pressure rechecked as directed by your health care provider.   Take medicines only as directed by your health care provider. Follow the directions carefully. Blood pressure medicines must be taken as prescribed. The medicine does not work as well when you skip doses. Skipping doses also puts you at risk for  problems.  Do not smoke.   Monitor your blood pressure at home as directed by your health care provider. SEEK MEDICAL CARE IF:   You think you are having a reaction to medicines taken.  You have recurrent headaches or feel dizzy.  You have swelling in your ankles.  You have trouble with your vision. SEEK IMMEDIATE MEDICAL CARE IF:  You develop a severe headache or confusion.  You have unusual weakness, numbness, or feel faint.  You have severe chest or abdominal pain.  You vomit repeatedly.  You have trouble breathing. MAKE SURE YOU:   Understand these instructions.  Will watch your condition.  Will get help right away if you are not doing well or get worse.   This information is not intended to replace advice given to you by your health care provider. Make sure you discuss any questions you have with your health care provider.   Document Released: 07/05/2005 Document Revised: 11/19/2014 Document Reviewed: 04/27/2013 Elsevier Interactive Patient Education 2016 [employer]  

## 2015-08-29 NOTE — Consult Note (Signed)
Discussed pt's "medication needs" with EDP. Pt's medications on $4 list at [facility]. Pt uses [facility]. CM unable to provide medications any cheaper. No CM needs at this time. Will review case again if needed.

## 2015-08-29 NOTE — ED Provider Notes (Signed)
CSN: [id number]     Arrival date & time 08/29/15  1537 History   First MD Initiated Contact with Patient 08/29/15 1547     Chief Complaint  Patient presents with  . Hypertension     (Consider location/radiation/quality/duration/timing/severity/associated sxs/prior Treatment) HPI Patient with history of hypertension states he's been out of his medication for one month now. States he's been able to afford his medication. He's been having vague generalized headaches on and off for the past month. Denies any visual changes, focal weakness or numbness. Complains of very mild headache currently. Patient was at the health department to have dental work done when blood pressure noted to be elevated. EMS was called and transported the patient to the emergency department. Past Medical History  Diagnosis Date  . Tuberculosis 1995    53mo therapy, [place]  . Hypertension   . GERD (gastroesophageal reflux disease)   . Hyperlipidemia   . Smoker   . Stroke ([place])   . Paresthesia of hand, bilateral   . Headache    Past Surgical History  Procedure Laterality Date  . No past surgeries    . Inguinal hernia repair Left 03/29/2014    Procedure: LAPAROSCOPIC LEFT INGUINAL HERNIA REPAIR;  Surgeon: Ralene Ok, MD;  Location: [facility];  Service: General;  Laterality: Left;  . Insertion of mesh Left 03/29/2014    Procedure: INSERTION OF MESH;  Surgeon: Ralene Ok, MD;  Location: [facility];  Service: General;  Laterality: Left;  . Hernia repair     No family history on file. Social History  Substance Use Topics  . Smoking status: Current Every Day Smoker -- 0.50 packs/day for 23 years    Types: Cigarettes  . Smokeless tobacco: None  . Alcohol Use: No    Review of Systems  Constitutional: Negative for fever and chills.  HENT: Negative for congestion.   Eyes: Negative for visual disturbance.  Respiratory: Negative for chest tightness and shortness of breath.   Cardiovascular: Negative for chest  pain.  Gastrointestinal: Negative for nausea, vomiting, abdominal pain and diarrhea.  Musculoskeletal: Negative for myalgias, back pain, neck pain and neck stiffness.  Skin: Negative for rash and wound.  Neurological: Positive for headaches. Negative for dizziness, weakness, light-headedness and numbness.  All other systems reviewed and are negative.     Allergies  Review of patient's allergies indicates no known allergies.  Home Medications   Prior to Admission medications   Medication Sig Start Date End Date Taking? Authorizing Provider  aspirin EC 325 MG tablet Take 1 tablet (325 mg total) by mouth daily. 03/21/15   Samuella Cota, MD  lisinopril-hydrochlorothiazide (PRINZIDE,ZESTORETIC) 20-25 MG tablet Take 1 tablet by mouth daily. 08/29/15   Julianne Rice, MD  lovastatin (MEVACOR) 40 MG tablet Take 1 tablet (40 mg total) by mouth at bedtime. 03/21/15   Samuella Cota, MD  metoprolol (LOPRESSOR) 50 MG tablet Take 1 tablet (50 mg total) by mouth 2 (two) times daily. 08/29/15   Julianne Rice, MD   BP 182/120 mmHg  Pulse 70  Temp(Src) 98 F (36.7 C) (Oral)  Resp 18  Ht 5\' 10"  (1.778 m)  Wt 185 lb (83.915 kg)  BMI 26.54 kg/m2  SpO2 98% Physical Exam  Constitutional: He is oriented to person, place, and time. He appears well-developed and well-nourished. No distress.  HENT:  Head: Normocephalic and atraumatic.  Mouth/Throat: Oropharynx is clear and moist. No oropharyngeal exudate.  Eyes: EOM are normal. Pupils are equal, round, and reactive to light.  Neck: Normal range of motion. Neck supple.  Cardiovascular: Normal rate and regular rhythm.  Exam reveals no gallop and no friction rub.   No murmur heard. Pulmonary/Chest: Effort normal and breath sounds normal. No respiratory distress. He has no wheezes. He has no rales. He exhibits no tenderness.  Abdominal: Soft. Bowel sounds are normal. He exhibits no distension and no mass. There is no tenderness. There is no rebound  and no guarding.  Musculoskeletal: Normal range of motion. He exhibits no edema or tenderness.  Neurological: He is alert and oriented to person, place, and time.  Patient is alert and oriented x3 with clear, goal oriented speech. Patient has 5/5 motor in all extremities. Sensation is intact to light touch. Patient has a normal gait and walks without assistance.  Skin: Skin is warm and dry. No rash noted. No erythema.  Psychiatric: He has a normal mood and affect. His behavior is normal.  Nursing note and vitals reviewed.   ED Course  Procedures (including critical care time) Labs Review Labs Reviewed - No data to display  Imaging Review No results found. I have personally reviewed and evaluated these images and lab results as part of my medical decision-making.   EKG Interpretation None      MDM   Final diagnoses:  Elevated blood pressure    Normal neurologic exam. Patient will be given home meds and reevaluate blood pressure. Meds are on the border list at [facility]. He states that he has prescriptions he believes he will be able to afford them.  Patient's blood pressure is now 174/115. Secondary discharged. He is currently asymptomatic. Return precautions given.  Julianne Rice, MD 08/29/15 [phone]

## 2015-12-01 ENCOUNTER — Telehealth: Payer: Self-pay | Admitting: Internal Medicine

## 2015-12-01 MED ORDER — LISINOPRIL-HYDROCHLOROTHIAZIDE 20-25 MG PO TABS: 1.0000 | ORAL_TABLET | Freq: Every day | ORAL | Status: DC

## 2015-12-01 MED ORDER — METOPROLOL TARTRATE 50 MG PO TABS: 50.0000 mg | ORAL_TABLET | Freq: Two times a day (BID) | ORAL | Status: DC

## 2015-12-01 NOTE — Telephone Encounter (Signed)
Pt called the office requesting refill of HTN medications( Lisinopril/HCTZ and Metoprolol). Advised pt of need for f/u appt. Pt agreeable/ Scheduled 12/08/2015. Pt needs appt for further refills./ RLB

## 2015-12-01 NOTE — Telephone Encounter (Signed)
Medication sent to [facility] in [place] in error. Corrected to send to [facility], [place]. [facility] [place] contacted via phone to cancel scripts sent. Victor[place] December

## 2015-12-08 ENCOUNTER — Encounter: Payer: Self-pay | Admitting: Medical

## 2015-12-08 ENCOUNTER — Ambulatory Visit (INDEPENDENT_AMBULATORY_CARE_PROVIDER_SITE_OTHER): Payer: Self-pay | Admitting: Medical

## 2015-12-08 VITALS — BP 132/90 | HR 59 | Wt 187.0 lb

## 2015-12-08 DIAGNOSIS — N529 Male erectile dysfunction, unspecified: Secondary | ICD-10-CM

## 2015-12-08 DIAGNOSIS — Z8673 Personal history of transient ischemic attack (TIA), and cerebral infarction without residual deficits: Secondary | ICD-10-CM

## 2015-12-08 DIAGNOSIS — T148 Other injury of unspecified body region: Secondary | ICD-10-CM

## 2015-12-08 DIAGNOSIS — I10 Essential (primary) hypertension: Secondary | ICD-10-CM

## 2015-12-08 DIAGNOSIS — F17200 Nicotine dependence, unspecified, uncomplicated: Secondary | ICD-10-CM

## 2015-12-08 DIAGNOSIS — Z720 Tobacco use: Secondary | ICD-10-CM

## 2015-12-08 DIAGNOSIS — E785 Hyperlipidemia, unspecified: Secondary | ICD-10-CM

## 2015-12-08 DIAGNOSIS — B356 Tinea cruris: Secondary | ICD-10-CM

## 2015-12-08 DIAGNOSIS — W57XXXA Bitten or stung by nonvenomous insect and other nonvenomous arthropods, initial encounter: Secondary | ICD-10-CM

## 2015-12-08 MED ORDER — ASPIRIN EC 81 MG PO TBEC: 81.0000 mg | DELAYED_RELEASE_TABLET | Freq: Every day | ORAL | Status: DC

## 2015-12-08 MED ORDER — LOVASTATIN 40 MG PO TABS: 40.0000 mg | ORAL_TABLET | Freq: Every day | ORAL | Status: DC

## 2015-12-08 MED ORDER — METOPROLOL TARTRATE 50 MG PO TABS: 50.0000 mg | ORAL_TABLET | Freq: Two times a day (BID) | ORAL | Status: DC

## 2015-12-08 MED ORDER — POTASSIUM CHLORIDE CRYS ER 10 MEQ PO TBCR: 10.0000 meq | EXTENDED_RELEASE_TABLET | Freq: Every day | ORAL | Status: DC

## 2015-12-08 MED ORDER — LISINOPRIL-HYDROCHLOROTHIAZIDE 20-25 MG PO TABS
1.0000 | ORAL_TABLET | Freq: Every day | ORAL | Status: DC
Start: 2015-12-08 — End: 2016-10-10

## 2015-12-08 NOTE — Progress Notes (Signed)
Subjective: Chief Complaint  Patient presents with  . Follow-up    was set up  since he had not been here in awhile and is on BP medication   Here for f/u at my request.  Last visit was 12/2014, and he was admitted to the hospital 03/2015 for paresthesias.  He has hx/o stroke like symptoms about a year ago, hypertension, hyperlipidemia, smoker.      He was admitted 03/2015 for numbness but after eval and negative workup was thought to be related to overuse phenomenon working on his car.  Was having numbness in hands.    Compliant with Aspirin 325mg  daily, Lisinopril HCT 20/25 mg daily, Metoprolol 50mg  BID, mevacor 40mg .     Still smoking.  Trying to quit but its hard.  Hands stay weak, numb and tingling.  Works part time at [employer] in [place].  Full time job laid him off last year.  Lives alone.  ED - has problems getting erections, both getting and keeping erections  He has rash itchy in groin and upper thighs, stays moist in general in this area  Had insect bite in left inguinal region, has a place he wants looked at  Past Medical History  Diagnosis Date  . Tuberculosis 1995    80mo therapy, [place]  . Hypertension   . GERD (gastroesophageal reflux disease)   . Hyperlipidemia   . Smoker   . Stroke ([place])   . Paresthesia of hand, bilateral   . Headache    ROS as in subjective    Objective: BP 132/90 mmHg  Pulse 59  Wt 187 lb (84.823 kg)  General appearance: alert, no distress, WD/WN, AA male Neck: supple, no lymphadenopathy, no thyromegaly, no masses, no bruits Heart: RRR, normal S1, S2, no murmurs Lungs: CTA bilaterally, no wheezes, rhonchi, or rales Pulses: 2+ symmetric, upper and lower extremities, normal cap refill Ext: no edema Skin: contiguous mild erythematous rash of groin and upper inner thighs Left inguinal region with slight raised localized erythema and irregular divit of skin suggestive of tear or localized trauma to the skin, possibly from tick removed or  pulled away   Assessment: Encounter Diagnoses  Name Primary?  . Essential hypertension Yes  . Hyperlipidemia   . Smoker   . History of stroke   . Erectile dysfunction, unspecified erectile dysfunction type   . Tinea cruris   . Insect bite     Plan: He is due for labs, but given self pay status with limited income, he declines labs today.   After reviewing home BP readings, which are mostly normal, will c/t current medication  hyperlipidemia -c/t current medication  Smoker - again reiterated need to stop tobacco  Hx/o stroke - c/t current medications   Tinea cruris - begin OTC tinactin or Lamisil cream  ED - begin trial of Viagra.  Few samples given, discussed risks/benefits of medication  Insect bite - can use some OTC benadryl and Cortaid cream short term. Likely tick bite given appearence of torn skin and localized redness, but no target lesion or other symptoms worrisome for RMSF or lyme disease.  Kaidyn was seen today for follow-up.  Diagnoses and all orders for this visit:  Essential hypertension  Hyperlipidemia  Smoker  History of stroke  Erectile dysfunction, unspecified erectile dysfunction type  Tinea cruris  Insect bite  Other orders -     lisinopril-hydrochlorothiazide (PRINZIDE,ZESTORETIC) 20-25 MG tablet; Take 1 tablet by mouth daily. -     lovastatin (MEVACOR) 40 MG  tablet; Take 1 tablet (40 mg total) by mouth at bedtime. -     metoprolol (LOPRESSOR) 50 MG tablet; Take 1 tablet (50 mg total) by mouth 2 (two) times daily. -     aspirin EC 81 MG tablet; Take 1 tablet (81 mg total) by mouth daily. -     potassium chloride (K-DUR,KLOR-CON) 10 MEQ tablet; Take 1 tablet (10 mEq total) by mouth daily.

## 2016-07-10 IMAGING — CT CT HEAD W/O CM
1 of 2 series · 16 of 30 positions shown, 20 images · non-contrast
Comparison: None.

CLINICAL DATA: Constant throbbing headache. Left arm weakness and
paresthesias. Duration 3.5 hours.

EXAM:
CT HEAD WITHOUT CONTRAST
TECHNIQUE: Contiguous axial images were obtained from the base of the skull
through the vertex without intravenous contrast.

[Series 3: headtrauma 2.4 h60s · axial · 0.47mm/px · z∈[+51,+179]mm · 16 of 60 slices shown, 20 images]
[im 4/60  brain]
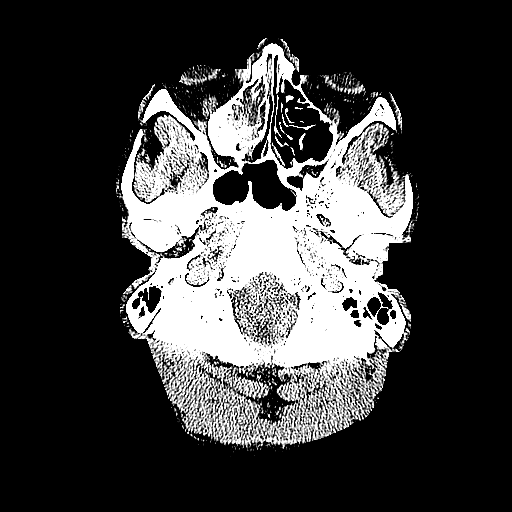
[im 4/60  bone]
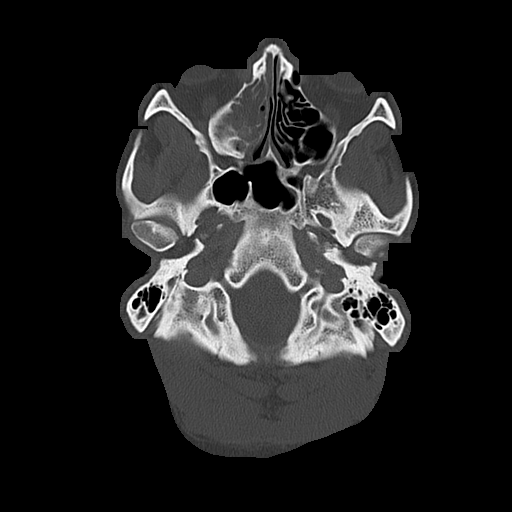
[im 7/60  brain]
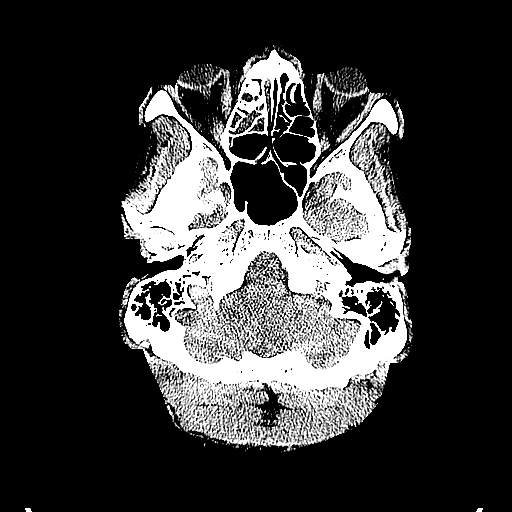
[im 10/60  brain]
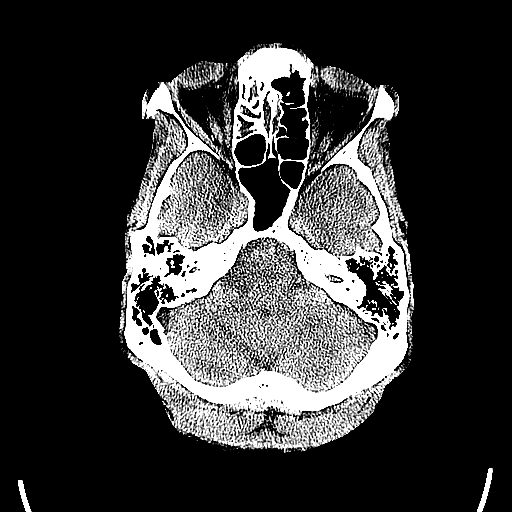
[im 13/60  brain]
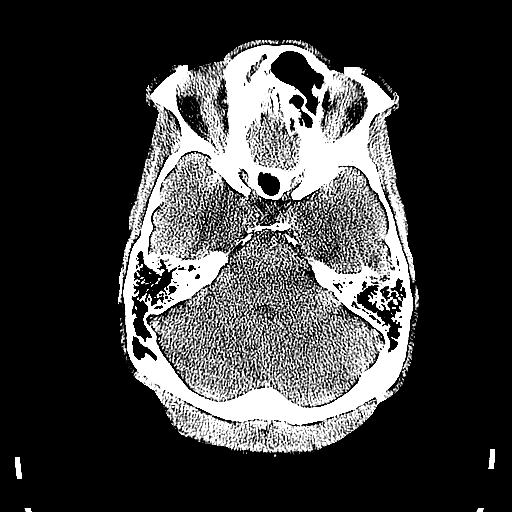
[im 19/60  brain]
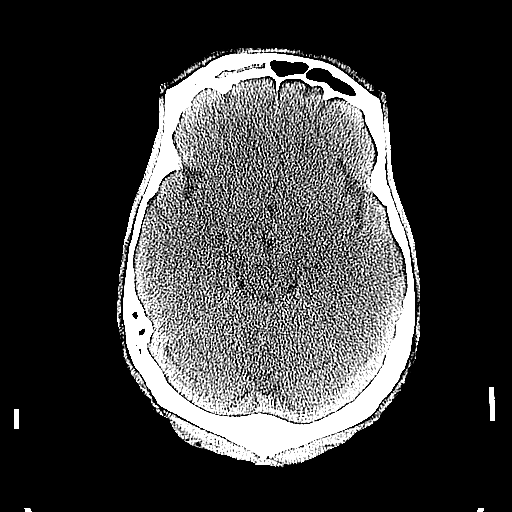
[im 19/60  bone]
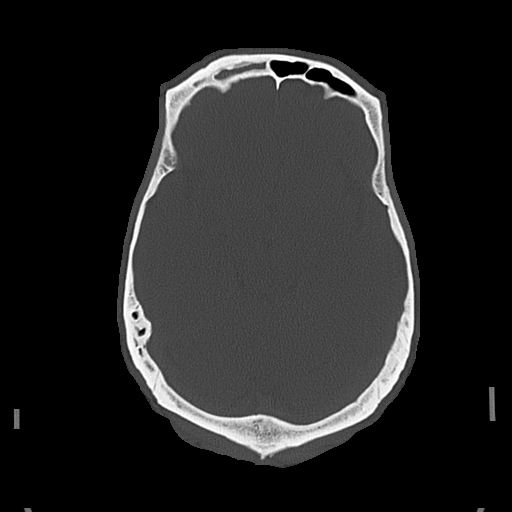
[im 22/60  brain]
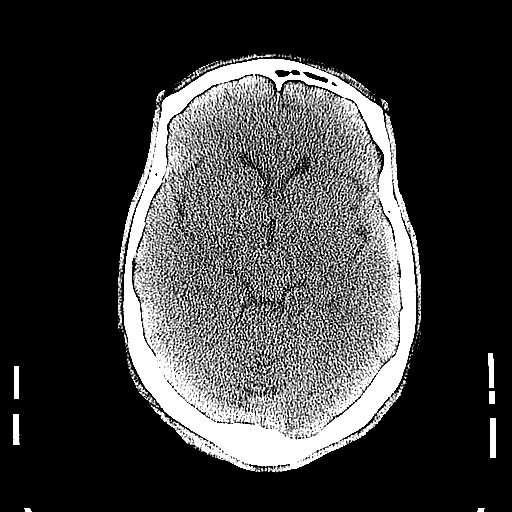
[im 25/60  brain]
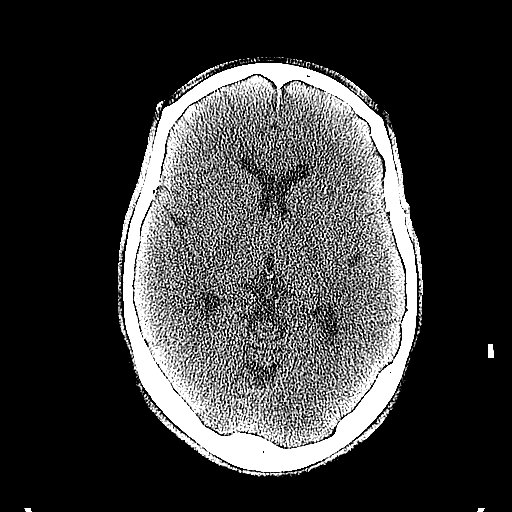
[im 28/60  brain]
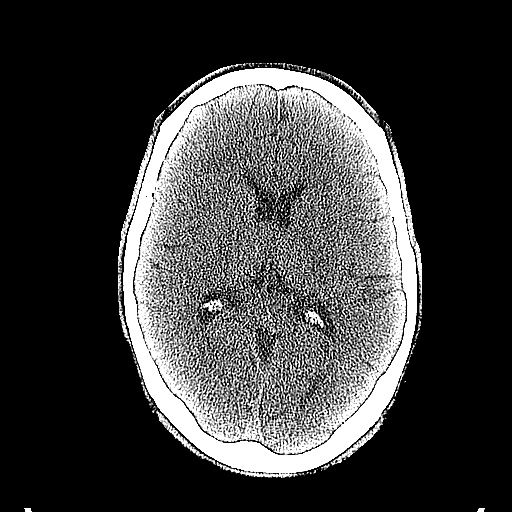
[im 32/60  brain]
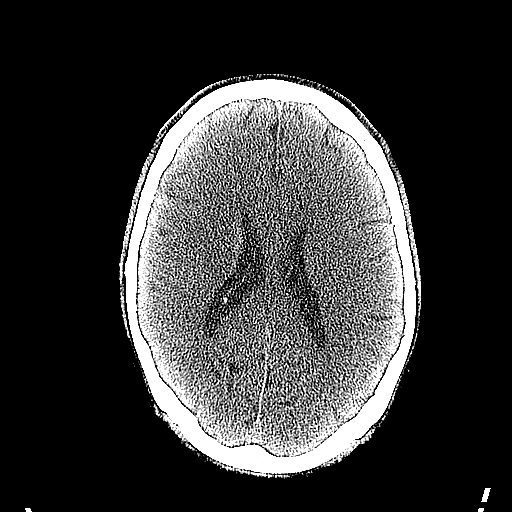
[im 32/60  bone]
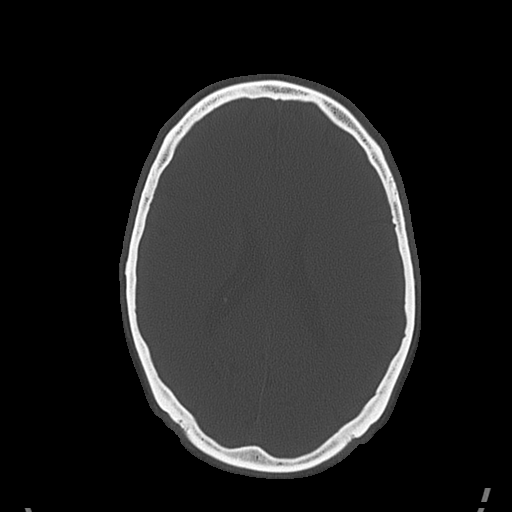
[im 35/60  brain]
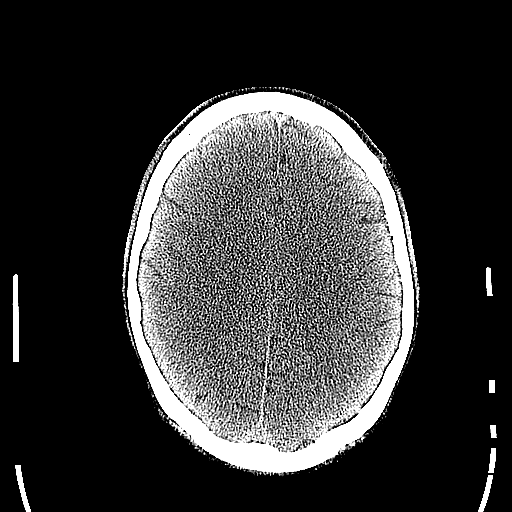
[im 38/60  brain]
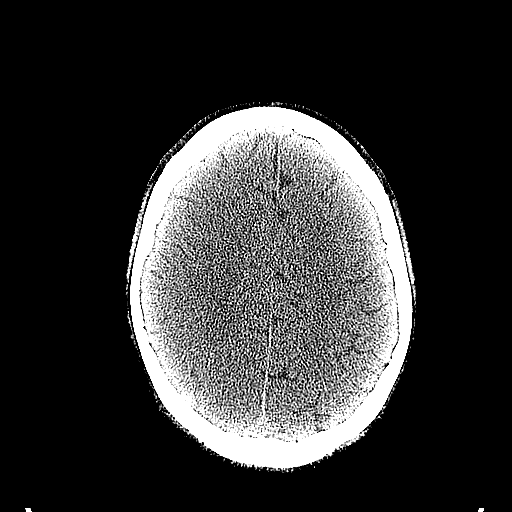
[im 41/60  brain]
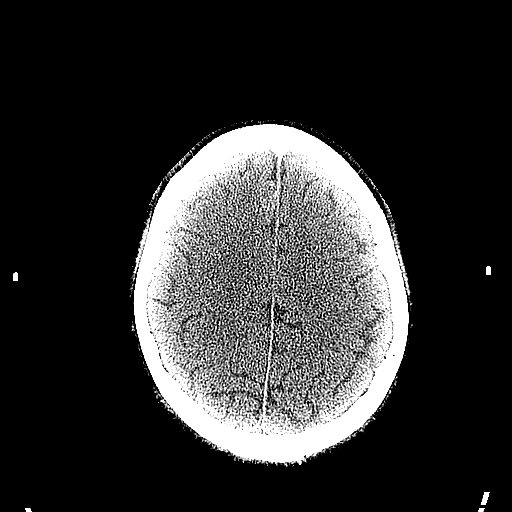
[im 47/60  brain]
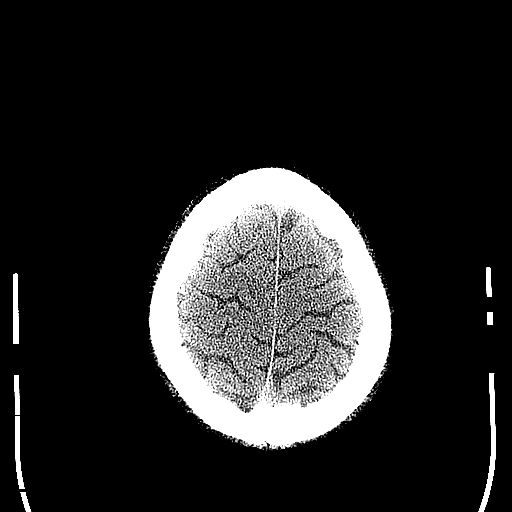
[im 47/60  bone]
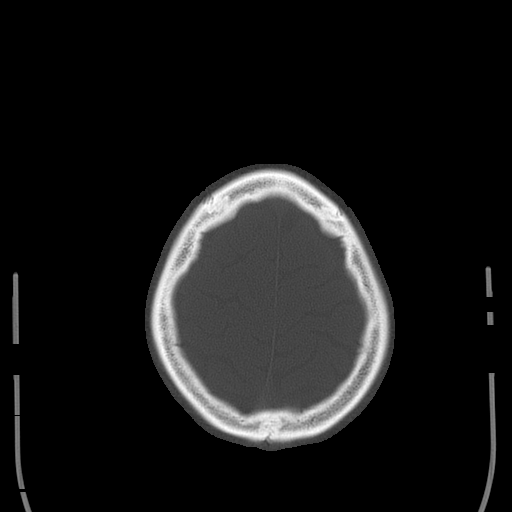
[im 50/60  brain]
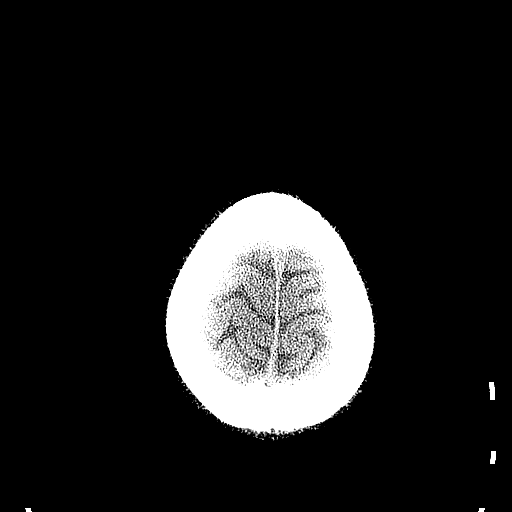
[im 53/60  brain]
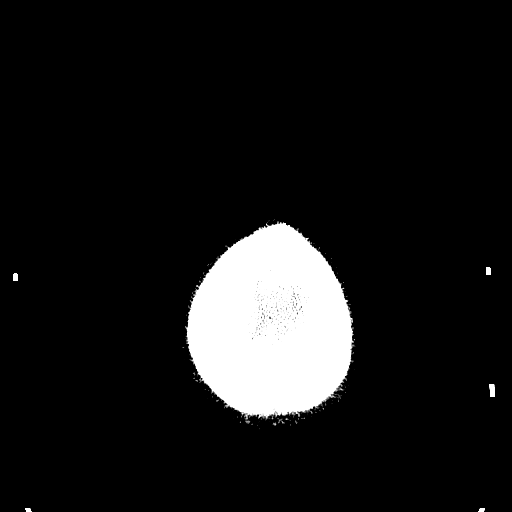
[im 56/60  brain]
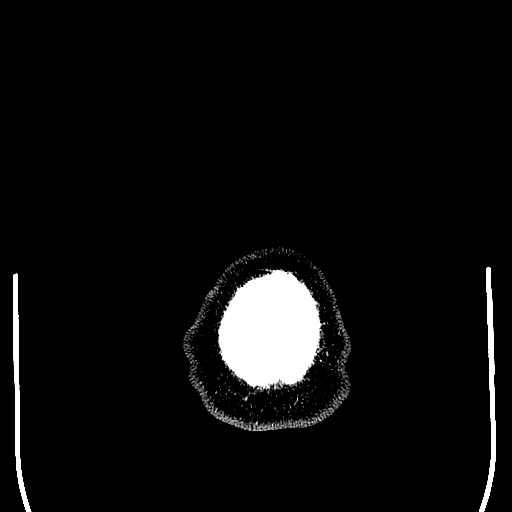

[16 of 30 positions shown; findings below may reference images not displayed]

FINDINGS: There is no intracranial hemorrhage, mass or evidence of acute
infarction. There is no extra-axial fluid collection. Gray matter
and white matter appear normal. Cerebral volume is normal for age.
Brainstem and posterior fossa are unremarkable. The CSF spaces
appear normal.

The bony structures are intact. There is severe chronic sinusitis
involving the right frontal, ethmoid and maxillary sinuses. These
sinuses are completely opacified and exhibit moderate bony sclerosis
and bony thickening in thickened of of chronicity.
IMPRESSION: 1. Normal brain
2. Severe chronic right frontal, ethmoid and maxillary sinusitis

## 2016-10-10 ENCOUNTER — Other Ambulatory Visit: Payer: Self-pay | Admitting: Medical

## 2017-04-11 ENCOUNTER — Other Ambulatory Visit: Payer: Self-pay | Admitting: Medical

## 2017-04-11 NOTE — Telephone Encounter (Signed)
Tried to call pt to let him know that he needs an set up appt for med check or cpe. He wasn't wrong working.

## 2017-04-12 ENCOUNTER — Other Ambulatory Visit: Payer: Self-pay

## 2017-04-12 ENCOUNTER — Telehealth: Payer: Self-pay | Admitting: Medical

## 2017-04-12 MED ORDER — LISINOPRIL-HYDROCHLOROTHIAZIDE 20-25 MG PO TABS: 1.0000 | ORAL_TABLET | Freq: Every day | ORAL | 0 refills | Status: DC

## 2017-04-12 MED ORDER — METOPROLOL TARTRATE 50 MG PO TABS
50.0000 mg | ORAL_TABLET | Freq: Two times a day (BID) | ORAL | 0 refills | Status: DC
Start: 2017-04-12 — End: 2017-08-29

## 2017-04-12 MED ORDER — METOPROLOL TARTRATE 50 MG PO TABS: 50.0000 mg | ORAL_TABLET | Freq: Two times a day (BID) | ORAL | 0 refills | Status: DC

## 2017-04-12 NOTE — Telephone Encounter (Signed)
Sent in 30 day supply until he comes in for his appt.

## 2017-04-12 NOTE — Telephone Encounter (Signed)
Pt made a [facility] appt for 04/19/17. He need a refill on Lisinopril & Metoprolol today because he took his last pills yesterday.

## 2017-04-19 ENCOUNTER — Ambulatory Visit (INDEPENDENT_AMBULATORY_CARE_PROVIDER_SITE_OTHER): Payer: Self-pay | Admitting: Medical

## 2017-04-19 ENCOUNTER — Encounter: Payer: Self-pay | Admitting: Medical

## 2017-04-19 VITALS — BP 138/90 | HR 63 | Wt 183.4 lb

## 2017-04-19 DIAGNOSIS — Z599 Problem related to housing and economic circumstances, unspecified: Secondary | ICD-10-CM | POA: Insufficient documentation

## 2017-04-19 DIAGNOSIS — Z8673 Personal history of transient ischemic attack (TIA), and cerebral infarction without residual deficits: Secondary | ICD-10-CM | POA: Insufficient documentation

## 2017-04-19 DIAGNOSIS — Z598 Other problems related to housing and economic circumstances: Secondary | ICD-10-CM

## 2017-04-19 DIAGNOSIS — F17200 Nicotine dependence, unspecified, uncomplicated: Secondary | ICD-10-CM

## 2017-04-19 DIAGNOSIS — I10 Essential (primary) hypertension: Secondary | ICD-10-CM

## 2017-04-19 DIAGNOSIS — E785 Hyperlipidemia, unspecified: Secondary | ICD-10-CM

## 2017-04-19 DIAGNOSIS — N529 Male erectile dysfunction, unspecified: Secondary | ICD-10-CM

## 2017-04-19 MED ORDER — LISINOPRIL-HYDROCHLOROTHIAZIDE 20-25 MG PO TABS: 1.0000 | ORAL_TABLET | Freq: Every day | ORAL | 0 refills | Status: DC

## 2017-04-19 MED ORDER — METOPROLOL TARTRATE 100 MG PO TABS: 100.0000 mg | ORAL_TABLET | Freq: Two times a day (BID) | ORAL | 0 refills | Status: DC

## 2017-04-19 MED ORDER — ASPIRIN EC 81 MG PO TBEC: 81.0000 mg | DELAYED_RELEASE_TABLET | Freq: Every day | ORAL | 3 refills | Status: DC

## 2017-04-19 MED ORDER — LOVASTATIN 40 MG PO TABS: 40.0000 mg | ORAL_TABLET | Freq: Every day | ORAL | 0 refills | Status: DC

## 2017-04-19 MED ORDER — SILDENAFIL CITRATE 20 MG PO TABS: ORAL_TABLET | ORAL | 2 refills | Status: DC

## 2017-04-19 NOTE — Progress Notes (Signed)
Subjective:   Shane Valentine is a 51 y.o. male presenting on 04/19/2017 with med check (med check )  Last visit 11/2015.   hasn't been back due to lack of insurance and financial ddifficulties.  He was late for his appt today due to city bus being late.    He is self pay today.  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     He has not had a colonoscopy  He is taking Aspirin and the 2 BP medications but not the statin. Was scared to take the statin.  He does still smoke some.  Gets exercise.  No other aggravating or relieving factors.  No other complaint.  Review of Systems ROS as in subjective  Past Medical History:  Diagnosis Date  . GERD (gastroesophageal reflux disease)   . Headache   . Hyperlipidemia   . Hypertension   . Paresthesia of hand, bilateral   . Smoker   . Stroke ([place])   . Tuberculosis 1995   90mo therapy, [place]   Current Outpatient Prescriptions on File Prior to Visit  Medication Sig Dispense Refill  . metoprolol tartrate (LOPRESSOR) 50 MG tablet Take 1 tablet (50 mg total) by mouth 2 (two) times daily. 60 tablet 0   No current facility-administered medications on file prior to visit.    ROS as in subjective     Objective: BP 138/90   Pulse 63   Wt 183 lb 6.4 oz (83.2 kg)   SpO2 98%   BMI 26.32 kg/m   General appearance: alert, no distress, WD/WN Neck: supple, no lymphadenopathy, no thyromegaly, no masses, no bruits Heart: RRR, normal S1, S2, no murmurs Lungs: CTA bilaterally, no wheezes, rhonchi, or rales Abdomen: +bs, soft, non tender, non distended, no masses, no hepatomegaly, no splenomegaly Pulses: 1+ symmetric, upper and lower extremities, normal cap refill Ext:no edema      Assessment: Encounter Diagnoses  Name Primary?  . Essential hypertension Yes  . Hyperlipidemia, unspecified hyperlipidemia type   . Smoker   . History of stroke   . Financial difficulties   . Erectile  dysfunction, unspecified erectile dysfunction type      Plan: Counseled on benefit of statin, and restart statin C/t rest of medications but increased dose of metoprolol He is due for physical and labs.  He is self pay and declined labs today If he doesn't get insurance or isn't able to return for labs within 1-2 months, then [facility].  otherwise he will return in 1-2 mo counseled on smoking cessation.  He is making some attempts to quit ED - failed Viagra prior.   Trial of generic sildenafil.  discussed risks/benefits of medication  Ricco was seen today for med check.  Diagnoses and all orders for this visit:  Essential hypertension  Hyperlipidemia, unspecified hyperlipidemia type  Smoker  History of stroke  Financial difficulties  Erectile dysfunction, unspecified erectile dysfunction type  Other orders -     aspirin EC 81 MG tablet; Take 1 tablet (81 mg total) by mouth daily. -     lisinopril-hydrochlorothiazide (PRINZIDE,ZESTORETIC) 20-25 MG tablet; Take 1 tablet by mouth daily. -     lovastatin (MEVACOR) 40 MG tablet; Take 1 tablet (40 mg total) by mouth at bedtime. -     metoprolol tartrate (LOPRESSOR) 100 MG tablet; Take 1 tablet (100 mg total) by mouth 2 (two) times daily. -  sildenafil (REVATIO) 20 MG tablet; 1-5 tablets (20 mg to 100 mg) prior to sexual activity daily prn

## 2017-08-16 ENCOUNTER — Other Ambulatory Visit: Payer: Self-pay | Admitting: Medical

## 2017-08-16 NOTE — Telephone Encounter (Signed)
Called and l/m for pt call us back to make an appt for refill on meds.

## 2017-08-29 ENCOUNTER — Encounter: Payer: Self-pay | Admitting: Medical

## 2017-08-29 ENCOUNTER — Ambulatory Visit: Payer: BLUE CROSS/BLUE SHIELD | Admitting: Medical

## 2017-08-29 VITALS — BP 134/86 | HR 71 | Wt 183.0 lb

## 2017-08-29 DIAGNOSIS — R202 Paresthesia of skin: Secondary | ICD-10-CM

## 2017-08-29 DIAGNOSIS — E785 Hyperlipidemia, unspecified: Secondary | ICD-10-CM

## 2017-08-29 DIAGNOSIS — I10 Essential (primary) hypertension: Secondary | ICD-10-CM | POA: Diagnosis not present

## 2017-08-29 DIAGNOSIS — Z8673 Personal history of transient ischemic attack (TIA), and cerebral infarction without residual deficits: Secondary | ICD-10-CM | POA: Diagnosis not present

## 2017-08-29 DIAGNOSIS — R29898 Other symptoms and signs involving the musculoskeletal system: Secondary | ICD-10-CM | POA: Diagnosis not present

## 2017-08-29 DIAGNOSIS — F17200 Nicotine dependence, unspecified, uncomplicated: Secondary | ICD-10-CM

## 2017-08-29 NOTE — Patient Instructions (Addendum)
Encounter Diagnoses  Name Primary?  . Essential hypertension Yes  . History of stroke   . Hyperlipidemia, unspecified hyperlipidemia type   . Smoker   . Left leg weakness   . Paresthesia     Recommendations:  Eat a healthy low-fat diet, limit fast food and fried food  Limit salt  Exercise regularly and try to stretch every day including your back and legs as your flexibility is a little limited today  I am glad to hear you have a job with insurance now  Continue your blood pressure medicines and restart your cholesterol medicine  We will call with lab results and other recommendations  We may need to send you for a back x-ray   keep cutting back on tobacco and try to stop smoking as your leg symptoms sound related to a spinal issue  Vaccines  Please call your insurance to check coverage for the following vaccines as I have no record of vaccines on file for you  We recommend a yearly flu shot typically in the fall like in September  I recommend a tetanus booster every 10 years  I recommend a pneumonia vaccine once now and at age 83  I recommend you have a shingles vaccine to help prevent shingles or herpes zoster outbreak.

## 2017-08-29 NOTE — Progress Notes (Signed)
Subjective:   Shane Valentine is a 52 y.o. male presenting on 08/29/2017 with med check (med check and numbness in left leg going on for 1 month )  Since last visit got new job with insurance.   Doing well in general.  His history is significant for stroke, tobacco use, high blood pressure, hyperlipidemia, erectile dysfunction.  He notes some tingling and weakness in left leg x 1 month.  Feels like body stays weak all the time, so gets weak in body including back as well.  Had bad cramp in left leg few weeks ago.   Not necessarily having leg pain with walking and retrieved with rest.   More of a lower left leg pain at times, but ongoing tingling and weakness.   HTN - He currently is taking lisinopril HCT 20/25 mg daily, Metoprolol 100 mg twice daily.  Not checking BPs regularly.  Hyperlipidemia - He stopped taking Mevacor 40 mg 3 weeks ago due to left leg numbness.    Is taking aspirin 325 mg daily  He does still smoke some.  Gets exercise.  No other aggravating or relieving factors.  No other complaint.  Review of Systems ROS as in subjective  Past Medical History:  Diagnosis Date  . GERD (gastroesophageal reflux disease)   . Headache   . Hyperlipidemia   . Hypertension   . Paresthesia of hand, bilateral   . Smoker   . Stroke ([place])   . Tuberculosis 1995   65mo therapy, [place]   Current Outpatient Medications on File Prior to Visit  Medication Sig Dispense Refill  . aspirin 325 MG tablet Take 325 mg by mouth daily.    Marland Kitchen lisinopril-hydrochlorothiazide (PRINZIDE,ZESTORETIC) 20-25 MG tablet TAKE 1 TABLET BY MOUTH ONCE DAILY 90 tablet 0  . metoprolol tartrate (LOPRESSOR) 100 MG tablet TAKE 1 TABLET BY MOUTH TWICE DAILY 180 tablet 0  . aspirin EC 81 MG tablet Take 1 tablet (81 mg total) by mouth daily. (Patient not taking: Reported on 08/29/2017) 90 tablet 3  . lovastatin (MEVACOR) 40 MG tablet Take 1 tablet (40 mg total) by mouth at bedtime. (Patient not taking: Reported on  08/29/2017) 90 tablet 0  . sildenafil (REVATIO) 20 MG tablet 1-5 tablets (20 mg to 100 mg) prior to sexual activity daily prn (Patient not taking: Reported on 08/29/2017) 50 tablet 2   No current facility-administered medications on file prior to visit.    ROS as in subjective     Objective: BP 134/86   Pulse 71   Wt 183 lb (83 kg)   SpO2 96%   BMI 26.26 kg/m   Wt Readings from Last 3 Encounters:  08/29/17 183 lb (83 kg)  04/19/17 183 lb 6.4 oz (83.2 kg)  12/08/15 187 lb (84.8 kg)   BP Readings from Last 3 Encounters:  08/29/17 134/86  04/19/17 138/90  12/08/15 132/90   General appearance: alert, no distress, WD/WN Neck: supple, no lymphadenopathy, no thyromegaly, no masses, no bruits Heart: RRR, normal S1, S2, no murmurs Lungs: CTA bilaterally, no wheezes, rhonchi, or rales Back: nontender, mild pain with ROM which is about 70% of normal, mostly due to poor flexibility Pulses: 1+ symmetric, lower extremities, 2+ upper Ext, normal cap refill Ext:no edema Leg nontender, no asymmetry no swelling no atrophy, normal range of motion neuro CN II through XII intact, DTRs decreased in general throughout, left leg in general seems slightly weaker than the right although he can resist against force and gravity, no other obvious  deficit      Assessment: Encounter Diagnoses  Name Primary?  . Essential hypertension Yes  . History of stroke   . Hyperlipidemia, unspecified hyperlipidemia type   . Smoker   . Left leg weakness   . Paresthesia      Plan: Counseled on benefit of statin, and restart statin C/t BP medications, counseled on diet, exercise, salt limitations Counseled on smoking cessation.  He is making some attempts to quit Leg weakness, paresthesia - likely lumbar issue.  Labs today, consider xray of L spine. Consider ABIs, but he does have 1+ pedal pulses   Shane Valentine was seen today for med check.  Diagnoses and all orders for this visit:  Essential hypertension -      Hemoglobin A1c -     TSH -     Comprehensive metabolic panel -     Lipid panel -     CBC with Differential/Platelet -     Vitamin B12  History of stroke -     Lipid panel  Hyperlipidemia, unspecified hyperlipidemia type -     Lipid panel  Smoker  Left leg weakness -     Comprehensive metabolic panel -     CBC with Differential/Platelet -     Vitamin B12  Paresthesia -     Comprehensive metabolic panel -     CBC with Differential/Platelet -     Vitamin B12

## 2017-08-30 ENCOUNTER — Other Ambulatory Visit: Payer: Self-pay | Admitting: Medical

## 2017-08-30 DIAGNOSIS — M5410 Radiculopathy, site unspecified: Secondary | ICD-10-CM

## 2017-08-30 LAB — COMPREHENSIVE METABOLIC PANEL
ALBUMIN: 4.3 g/dL (ref 3.5–5.5)
ALK PHOS: 80 IU/L (ref 39–117)
ALT: 9 IU/L (ref 0–44)
AST: 15 IU/L (ref 0–40)
Albumin/Globulin Ratio: 1.8 (ref 1.2–2.2)
BUN / CREAT RATIO: 15 (ref 9–20)
BUN: 19 mg/dL (ref 6–24)
Bilirubin Total: 0.2 mg/dL (ref 0.0–1.2)
CALCIUM: 9.2 mg/dL (ref 8.7–10.2)
CO2: 26 mmol/L (ref 20–29)
Chloride: 102 mmol/L (ref 96–106)
Creatinine, Ser: 1.26 mg/dL (ref 0.76–1.27)
GFR calc non Af Amer: 65 mL/min/{1.73_m2} (ref >59)
GFR, EST AFRICAN AMERICAN: 75 mL/min/{1.73_m2} (ref >59)
GLOBULIN, TOTAL: 2.4 g/dL (ref 1.5–4.5)
GLUCOSE: 85 mg/dL (ref 65–99)
Potassium: 3.7 mmol/L (ref 3.5–5.2)
Sodium: 145 mmol/L — ABNORMAL HIGH (ref 134–144)
Total Protein: 6.7 g/dL (ref 6.0–8.5)

## 2017-08-30 LAB — LIPID PANEL
CHOL/HDL RATIO: 4.9 ratio (ref 0.0–5.0)
CHOLESTEROL TOTAL: 191 mg/dL (ref 100–199)
HDL: 39 mg/dL — AB (ref >39)
LDL Calculated: 122 mg/dL — ABNORMAL HIGH (ref 0–99)
TRIGLYCERIDES: 149 mg/dL (ref 0–149)
VLDL Cholesterol Cal: 30 mg/dL (ref 5–40)

## 2017-08-30 LAB — CBC WITH DIFFERENTIAL/PLATELET
BASOS: 0 %
Basophils Absolute: 0 10*3/uL (ref 0.0–0.2)
EOS (ABSOLUTE): 0.1 10*3/uL (ref 0.0–0.4)
EOS: 1 %
HEMATOCRIT: 40.7 % (ref 37.5–51.0)
HEMOGLOBIN: 13 g/dL (ref 13.0–17.7)
IMMATURE GRANS (ABS): 0 10*3/uL (ref 0.0–0.1)
Immature Granulocytes: 0 %
LYMPHS: 26 %
Lymphocytes Absolute: 2.2 10*3/uL (ref 0.7–3.1)
MCH: 30.7 pg (ref 26.6–33.0)
MCHC: 31.9 g/dL (ref 31.5–35.7)
MCV: 96 fL (ref 79–97)
MONOCYTES: 5 %
Monocytes Absolute: 0.4 10*3/uL (ref 0.1–0.9)
NEUTROS ABS: 5.6 10*3/uL (ref 1.4–7.0)
NEUTROS PCT: 68 %
PLATELETS: 212 10*3/uL (ref 150–379)
RBC: 4.23 x10E6/uL (ref 4.14–5.80)
RDW: 13.9 % (ref 12.3–15.4)
WBC: 8.3 10*3/uL (ref 3.4–10.8)

## 2017-08-30 LAB — TSH: TSH: 0.689 u[IU]/mL (ref 0.450–4.500)

## 2017-08-30 LAB — VITAMIN B12: Vitamin B-12: 469 pg/mL (ref 232–1245)

## 2017-08-30 LAB — HEMOGLOBIN A1C
Est. average glucose Bld gHb Est-mCnc: 111 mg/dL
Hgb A1c MFr Bld: 5.5 % (ref 4.8–5.6)

## 2017-08-30 MED ORDER — LOVASTATIN 40 MG PO TABS: 40.0000 mg | ORAL_TABLET | Freq: Every day | ORAL | 3 refills | Status: DC

## 2017-11-23 ENCOUNTER — Other Ambulatory Visit: Payer: Self-pay | Admitting: Medical

## 2017-11-30 ENCOUNTER — Encounter: Payer: Self-pay | Admitting: Medical

## 2017-11-30 ENCOUNTER — Ambulatory Visit (INDEPENDENT_AMBULATORY_CARE_PROVIDER_SITE_OTHER): Payer: BLUE CROSS/BLUE SHIELD | Admitting: Medical

## 2017-11-30 VITALS — BP 168/100 | HR 60 | Ht 68.5 in | Wt 176.0 lb

## 2017-11-30 DIAGNOSIS — Z7185 Encounter for immunization safety counseling: Secondary | ICD-10-CM | POA: Insufficient documentation

## 2017-11-30 DIAGNOSIS — Z0000 Encounter for general adult medical examination without abnormal findings: Secondary | ICD-10-CM | POA: Diagnosis not present

## 2017-11-30 DIAGNOSIS — M5410 Radiculopathy, site unspecified: Secondary | ICD-10-CM

## 2017-11-30 DIAGNOSIS — Z113 Encounter for screening for infections with a predominantly sexual mode of transmission: Secondary | ICD-10-CM | POA: Diagnosis not present

## 2017-11-30 DIAGNOSIS — K047 Periapical abscess without sinus: Secondary | ICD-10-CM

## 2017-11-30 DIAGNOSIS — Z122 Encounter for screening for malignant neoplasm of respiratory organs: Secondary | ICD-10-CM | POA: Diagnosis not present

## 2017-11-30 DIAGNOSIS — R202 Paresthesia of skin: Secondary | ICD-10-CM

## 2017-11-30 DIAGNOSIS — Z1211 Encounter for screening for malignant neoplasm of colon: Secondary | ICD-10-CM | POA: Diagnosis not present

## 2017-11-30 DIAGNOSIS — N529 Male erectile dysfunction, unspecified: Secondary | ICD-10-CM | POA: Diagnosis not present

## 2017-11-30 DIAGNOSIS — Z125 Encounter for screening for malignant neoplasm of prostate: Secondary | ICD-10-CM | POA: Diagnosis not present

## 2017-11-30 DIAGNOSIS — G8929 Other chronic pain: Secondary | ICD-10-CM

## 2017-11-30 DIAGNOSIS — M5442 Lumbago with sciatica, left side: Secondary | ICD-10-CM

## 2017-11-30 DIAGNOSIS — I10 Essential (primary) hypertension: Secondary | ICD-10-CM

## 2017-11-30 DIAGNOSIS — A630 Anogenital (venereal) warts: Secondary | ICD-10-CM

## 2017-11-30 DIAGNOSIS — Z23 Encounter for immunization: Secondary | ICD-10-CM | POA: Diagnosis not present

## 2017-11-30 DIAGNOSIS — F17200 Nicotine dependence, unspecified, uncomplicated: Secondary | ICD-10-CM | POA: Diagnosis not present

## 2017-11-30 DIAGNOSIS — Z7189 Other specified counseling: Secondary | ICD-10-CM | POA: Diagnosis not present

## 2017-11-30 DIAGNOSIS — Z8673 Personal history of transient ischemic attack (TIA), and cerebral infarction without residual deficits: Secondary | ICD-10-CM

## 2017-11-30 DIAGNOSIS — E785 Hyperlipidemia, unspecified: Secondary | ICD-10-CM | POA: Diagnosis not present

## 2017-11-30 DIAGNOSIS — K029 Dental caries, unspecified: Secondary | ICD-10-CM

## 2017-11-30 LAB — POCT URINALYSIS DIP (PROADVANTAGE DEVICE)
BILIRUBIN UA: NEGATIVE mg/dL
Bilirubin, UA: NEGATIVE
GLUCOSE UA: NEGATIVE mg/dL
Leukocytes, UA: NEGATIVE
Nitrite, UA: NEGATIVE
Protein Ur, POC: NEGATIVE mg/dL
RBC UA: NEGATIVE
pH, UA: 6.5 (ref 5.0–8.0)

## 2017-11-30 MED ORDER — AMOXICILLIN 500 MG PO TABS: ORAL_TABLET | ORAL | 0 refills | Status: DC

## 2017-11-30 MED ORDER — AMLODIPINE BESYLATE 5 MG PO TABS: 5.0000 mg | ORAL_TABLET | Freq: Every day | ORAL | 3 refills | Status: DC

## 2017-11-30 MED ORDER — LISINOPRIL-HYDROCHLOROTHIAZIDE 20-25 MG PO TABS: 1.0000 | ORAL_TABLET | Freq: Every day | ORAL | 3 refills | Status: DC

## 2017-11-30 MED ORDER — METOPROLOL TARTRATE 100 MG PO TABS: 100.0000 mg | ORAL_TABLET | Freq: Two times a day (BID) | ORAL | 3 refills | Status: DC

## 2017-11-30 MED ORDER — HYDROCODONE-ACETAMINOPHEN 5-325 MG PO TABS: 1.0000 | ORAL_TABLET | Freq: Four times a day (QID) | ORAL | 0 refills | Status: DC | PRN

## 2017-11-30 NOTE — Progress Notes (Signed)
Subjective:   HPI  Shane Valentine is a 52 y.o. male who presents for Chief Complaint  Patient presents with  . Annual Exam    Medical care team includes: Tysinger, Camelia Eng, PA-C here for primary care Dr. Einar Gip, cardiology  Concerns: Hyperlipidemia - he apparent did not pick up script for  Mevacor 40mg    HTN - compliant with Metoprolol 100mg  BID and Lisinopril HCT 20/25mg  daily.  Still has chronic left leg pain and back pain.     Smoking - since age 63yo, 1ppd.    No prior colonoscopy  He saw dermatology in past, and now has recurrence of growths on his bottom  Memory has been worse since stroke a few years ago  Reviewed their medical, surgical, family, social, medication, and allergy history and updated chart as appropriate.  Past Medical History:  Diagnosis Date  . GERD (gastroesophageal reflux disease)   . Headache   . Hyperlipidemia   . Hypertension   . Paresthesia of hand, bilateral   . Smoker   . Stroke ([place])   . Tuberculosis 1995   36mo therapy, [place]    Past Surgical History:  Procedure Laterality Date  . INGUINAL HERNIA REPAIR Left 03/29/2014   Procedure: LAPAROSCOPIC LEFT INGUINAL HERNIA REPAIR;  Surgeon: Ralene Ok, MD;  Location: [facility];  Service: General;  Laterality: Left;  . INSERTION OF MESH Left 03/29/2014   Procedure: INSERTION OF MESH;  Surgeon: Ralene Ok, MD;  Location: [place];  Service: General;  Laterality: Left;    Social History   Socioeconomic History  . Marital status: Single    Spouse name: Not on file  . Number of children: Not on file  . Years of education: Not on file  . Highest education level: Not on file  Occupational History  . Not on file  Social Needs  . Financial resource strain: Not on file  . Food insecurity:    Worry: Not on file    Inability: Not on file  . Transportation needs:    Medical: Not on file    Non-medical: Not on file  Tobacco Use  . Smoking status: Current Every Day Smoker   Packs/day: 0.50    Years: 23.00    Pack years: 11.50    Types: Cigarettes  . Smokeless tobacco: Never Used  Substance and Sexual Activity  . Alcohol use: No  . Drug use: Yes    Types: Marijuana    Comment: 03/28/14  --last time 03/27/14  . Sexual activity: Never  Lifestyle  . Physical activity:    Days per week: Not on file    Minutes per session: Not on file  . Stress: Not on file  Relationships  . Social connections:    Talks on phone: Not on file    Gets together: Not on file    Attends religious service: Not on file    Active member of club or organization: Not on file    Attends meetings of clubs or organizations: Not on file    Relationship status: Not on file  . Intimate partner violence:    Fear of current or ex partner: Not on file    Emotionally abused: Not on file    Physically abused: Not on file    Forced sexual activity: Not on file  Other Topics Concern  . Not on file  Social History Narrative  . Not on file    No family history on file.   Current Outpatient Medications:  .  aspirin 325 MG tablet, Take 325 mg by mouth daily., Disp: , Rfl:  .  lisinopril-hydrochlorothiazide (PRINZIDE,ZESTORETIC) 20-25 MG tablet, Take 1 tablet by mouth daily., Disp: 90 tablet, Rfl: 3 .  lovastatin (MEVACOR) 40 MG tablet, Take 1 tablet (40 mg total) by mouth at bedtime., Disp: 90 tablet, Rfl: 3 .  metoprolol tartrate (LOPRESSOR) 100 MG tablet, Take 1 tablet (100 mg total) by mouth 2 (two) times daily., Disp: 180 tablet, Rfl: 3 .  sildenafil (REVATIO) 20 MG tablet, 1-5 tablets (20 mg to 100 mg) prior to sexual activity daily prn, Disp: 50 tablet, Rfl: 2 .  amLODipine (NORVASC) 5 MG tablet, Take 1 tablet (5 mg total) by mouth daily., Disp: 90 tablet, Rfl: 3 .  amoxicillin (AMOXIL) 500 MG tablet, 2 tablets po BID x 10 days, Disp: 40 tablet, Rfl: 0 .  HYDROcodone-acetaminophen (NORCO) 5-325 MG tablet, Take 1 tablet by mouth every 6 (six) hours as needed for moderate pain., Disp: 10  tablet, Rfl: 0  No Known Allergies   Review of Systems Constitutional: -fever, -chills, -sweats, -unexpected weight change, -decreased appetite, -fatigue Allergy: -sneezing, -itching, -congestion Dermatology: +growths, -changing moles, --rash, -lumps ENT: -runny nose, -ear pain, -sore throat, -hoarseness, -sinus pain, +teeth pain, - ringing in ears, -hearing loss, -nosebleeds Cardiology: -chest pain, -palpitations, -swelling, -difficulty breathing when lying flat, -waking up short of breath Respiratory: -cough, -shortness of breath, -difficulty breathing with exercise or exertion, -wheezing, -coughing up blood Gastroenterology: -abdominal pain, -nausea, -vomiting, -diarrhea, -constipation, -blood in stool, -changes in bowel movement, -difficulty swallowing or eating Hematology: -bleeding, -bruising  Musculoskeletal: -joint aches, -muscle aches, -joint swelling, +back pain, +left leg pain, -neck pain, -cramping, -changes in gait Ophthalmology: denies vision changes, eye redness, itching, discharge Urology: -burning with urination, -difficulty urinating, -blood in urine, -urinary frequency, -urgency, -incontinence Neurology: -headache, -weakness, -tingling, -numbness, -memory loss, -falls, -dizziness Psychology: -depressed mood, -agitation, -sleep problems Male GU: no testicular mass, pain, no lymph nodes swollen, no swelling, no rash.     Objective:  BP (!) 168/100   Pulse 60   Ht 5' 8.5" (1.74 m)   Wt 176 lb (79.8 kg)   SpO2 97%   BMI 26.37 kg/m   General appearance: alert, no distress, WD/WN, African American male Skin: dorsal penis with 31mm diameter slightly raised verruca lesion, and few other smaller similar lesions on distal penis, large patch of verruca lesions on left and right buttock in the perianal region, few scattered macules, no other worrisome lesions HEENT: normocephalic, conjunctiva/corneas normal, sclerae anicteric, PERRLA, EOMi, nares patent, no discharge or  erythema, pharynx normal Oral cavity: MMM, tongue normal, severe plaque throughout, decay noted upper and lower teeth throughout Neck: supple, no lymphadenopathy, no thyromegaly, no masses, normal ROM, no bruits Chest: non tender, normal shape and expansion Heart: RRR, normal S1, S2, no murmurs Lungs: CTA bilaterally, no wheezes, rhonchi, or rales Abdomen: +bs, soft, non tender, non distended, no masses, no hepatomegaly, no splenomegaly, no bruits Back: non tender, normal ROM, no scoliosis Musculoskeletal: legs non tender, upper extremities non tender, no obvious deformity, normal ROM throughout, lower extremities non tender, no obvious deformity, normal ROM throughout Extremities: no edema, no cyanosis, no clubbing Pulses: 2+ symmetric, upper and lower extremities, normal cap refill Neurological: +pain with left straight leg raise, alert, oriented x 3, CN2-12 intact, strength normal upper extremities and lower extremities, sensation normal throughout, DTRs 2+ throughout, no cerebellar signs, gait normal Psychiatric: normal affect, behavior normal, pleasant  GU: normal male external genitalia, see skin  exam above,circumcised, non tender, no masses, no hernia, no lymphadenopathy Rectal: anus normal tone, prostate WNL   Assessment and Plan :   Encounter Diagnoses  Name Primary?  . Encounter for health maintenance examination in adult Yes  . Essential hypertension   . Erectile dysfunction, unspecified erectile dysfunction type   . Hyperlipidemia, unspecified hyperlipidemia type   . Smoker   . History of stroke   . Paresthesia   . Need for Tdap vaccination   . Need for pneumococcal vaccination   . Vaccine counseling   . Screening for prostate cancer   . Screen for colon cancer   . Encounter for screening for malignant neoplasm of respiratory organs   . Screen for STD (sexually transmitted disease)   . Dental caries   . Dental abscess   . Radicular syndrome of left leg   . Chronic  low back pain with left-sided sciatica, unspecified back pain laterality   . Genital warts   . Anal wart     Physical exam - discussed and counseled on healthy lifestyle, diet, exercise, preventative care, vaccinations, sick and well care, proper use of emergency dept and after hours care, and addressed their concerns.    Health screening: See your eye doctor yearly for routine vision care. See your dentist yearly for routine dental care including hygiene visits twice yearly.  Discussed STD testing, discussed prevention, condom use, means of transmission  Cancer screening Colonoscopy:  Referred for screening colonoscopy  Discussed PSA, prostate exam, and prostate cancer screening risks/benefits.     Vaccinations: Advised yearly influenza vaccine  Counseled on Shingles vaccine at age 7 years and older Patient will check insurance coverage for this and consider vaccination  Counseled on the pneumococcal vaccine.  Vaccine information sheet given.  Pneumococcal vaccine PPSV23 given after consent obtained.  Counseled on the Tdap (tetanus, diptheria, and acellular pertussis) vaccine.  Vaccine information sheet given. Tdap vaccine given after consent obtained.   Specific Concerns today:  Genital and other warts  We will refer you to dermatology for this  Cancer screening  We will refer you for your first screening colonoscopy  We will check a PSA prostate lab today  I recommend a Chest CT (xray test) lung cancer screening.  We will set this up  Vaccines:  Shingles vaccine:  I recommend you have a shingles vaccine to help prevent shingles or herpes zoster outbreak.   Please call your insurer to inquire about coverage for the Shingrix vaccine given in 2 doses.   Some insurers cover this vaccine after age 47, some cover this after age 57.  If your insurer covers this, then call to schedule appointment to have this vaccine here.  We updated your Tdap/Tetanus diphtheria pertussis  vaccine.    This is done every 10 years.      We recommend a yearly flu shot  We updated your pneumonia vaccine today  You have had a STROKE and have HEART DISEASE  The most important thing you can do is QUIT SMOKING!!!!!!!!!!!  Continue your medicating but make sure you are taking your cholesterol pill and aspirin at night as well  We recommend an updated visit with Dr. Einar Gip, your heart doctor.     Continue Metoprolol twice daily and Lisinopril HCT once daily for blood pressure  ADD Amlodipine 5mg  daily to help get your blood pressure to goal.   This is once daily in the morning  You should also be taking the Mevacor cholesterol medication and Aspirin  daily at bedtime to lower risk of repeat stroke  Dental cavities and abscess  Schedule appointment with a dentist ASAP  I will start you on Amoxicillin for infection/abscess of tooth  You can use the pain medication Hydrocodone short term for pain  Brush and floss your teeth every day!  Chronic leg and back pain  Go for the back xray when you get a chance   Jamorion was seen today for annual exam.  Diagnoses and all orders for this visit:  Encounter for health maintenance examination in adult -     PSA -     Ambulatory referral to Gastroenterology -     Ambulatory referral to Cardiology -     HIV antibody -     RPR -     GC/Chlamydia Probe Amp -     Hepatitis C antibody  Essential hypertension -     Ambulatory referral to Cardiology  Erectile dysfunction, unspecified erectile dysfunction type  Hyperlipidemia, unspecified hyperlipidemia type  Smoker -     Ambulatory referral to Cardiology  History of stroke  Paresthesia  Need for Tdap vaccination  Need for pneumococcal vaccination  Vaccine counseling  Screening for prostate cancer -     PSA  Screen for colon cancer -     Ambulatory referral to Gastroenterology  Encounter for screening for malignant neoplasm of respiratory organs -     CT CHEST LUNG  CA SCREEN LOW DOSE W/O CM; Future  Screen for STD (sexually transmitted disease) -     HIV antibody -     RPR -     GC/Chlamydia Probe Amp -     Hepatitis C antibody  Dental caries  Dental abscess  Radicular syndrome of left leg  Chronic low back pain with left-sided sciatica, unspecified back pain laterality  Genital warts  Anal wart  Other orders -     metoprolol tartrate (LOPRESSOR) 100 MG tablet; Take 1 tablet (100 mg total) by mouth 2 (two) times daily. -     lisinopril-hydrochlorothiazide (PRINZIDE,ZESTORETIC) 20-25 MG tablet; Take 1 tablet by mouth daily. -     amLODipine (NORVASC) 5 MG tablet; Take 1 tablet (5 mg total) by mouth daily. -     amoxicillin (AMOXIL) 500 MG tablet; 2 tablets po BID x 10 days -     HYDROcodone-acetaminophen (NORCO) 5-325 MG tablet; Take 1 tablet by mouth every 6 (six) hours as needed for moderate pain.   Follow-up pending labs, yearly for physical

## 2017-11-30 NOTE — Patient Instructions (Addendum)
Thanks for trusting [place] with your health care and for coming in for a physical today.  Below are some general recommendations I have for you:  Yearly screenings See your eye doctor yearly for routine vision care. See your dentist yearly for routine dental care including hygiene visits twice yearly. See me here yearly for a routine physical and preventative care visit  Specific Concerns today:  Genital and other warts  We will refer you to dermatology for this   Cancer screening  We will refer you for your first screening colonoscopy  We will check a PSA prostate lab today  I recommend a Chest CT (xray test) lung cancer screening.  We will set this up  Vaccines:  Shingles vaccine:  I recommend you have a shingles vaccine to help prevent shingles or herpes zoster outbreak.   Please call your insurer to inquire about coverage for the Shingrix vaccine given in 2 doses.   Some insurers cover this vaccine after age 22, some cover this after age 35.  If your insurer covers this, then call to schedule appointment to have this vaccine here.  We updated your Tdap/Tetanus diphtheria pertussis vaccine.    This is done every 10 years.      We recommend a yearly flu shot  We updated your pneumonia vaccine today  You have had a STROKE and have HEART DISEASE  The most important thing you can do is QUIT SMOKING!!!!!!!!!!!  Continue your medicating but make sure you are taking your cholesterol pill and aspirin at night as well  We recommend an updated visit with Dr. Einar Gip, your heart doctor.     Continue Metoprolol twice daily and Lisinopril HCT once daily for blood pressure  ADD Amlodipine 5mg  daily to help get your blood pressure to goal.   This is once daily in the morning  You should also be taking the Mevacor cholesterol medication and Aspirin daily at bedtime to lower risk of repeat stroke  Dental cavities and abscess  Schedule appointment with a dentist ASAP  I will start you on  Amoxicillin for infection/abscess of tooth  You can use the pain medication Hydrocodone short term for pain  Brush and floss your teeth every day!  Chronic leg and back pain  Go for the back xray when you get a chance    Dr. Jonna Coup, dentist [address] [place], [place] [address] [phone] [email or web address]   Eye doctors Dr. Webb Laws [place], [place], [place] [address] [phone]   [facility] Dr. Camillo Flaming [address], [place], [place] [address]  [email or web address]   Fabio Pierce, M.D. Corena Herter, O.D. [address], [place], [place] [address] Medical telephone: [phone] Optical telephone: [phone]      Please follow up yearly for a physical.   Preventative Care for Adults - Male      [place]:  A routine yearly physical is a good way to check in with your primary care provider about your health and preventive screening. It is also an opportunity to share updates about your health and any concerns you have, and receive a thorough all-over exam.   Most health insurance companies pay for at least some preventative services.  Check with your health plan for specific coverages.  WHAT PREVENTATIVE SERVICES DO WOMEN NEED?  Adult men should have their weight and blood pressure checked regularly.   Men age 34 and older should  have their cholesterol levels checked regularly.  Beginning at age 6 and continuing to age 40, men should be screened for colorectal cancer.  Certain people may need continued testing until age 3.  Updating vaccinations is part of preventative care.  Vaccinations help protect against diseases such as the flu.  Osteoporosis is a disease in which the bones lose minerals and strength as we age. Men ages 27 and over should discuss this with their caregivers  Lab tests are generally done as part of preventative care to screen for  anemia and blood disorders, to screen for problems with the kidneys and liver, to screen for bladder problems, to check blood sugar, and to check your cholesterol level.  Preventative services generally include counseling about diet, exercise, avoiding tobacco, drugs, excessive alcohol consumption, and sexually transmitted infections.    GENERAL RECOMMENDATIONS FOR GOOD HEALTH:  Healthy diet:  Eat a variety of foods, including fruit, vegetables, animal or vegetable protein, such as meat, fish, chicken, and eggs, or beans, lentils, tofu, and grains, such as rice.  Drink plenty of water daily.  Decrease saturated fat in the diet, avoid lots of red meat, processed foods, sweets, fast foods, and fried foods.  Exercise:  Aerobic exercise helps maintain good heart health. At least 30-40 minutes of moderate-intensity exercise is recommended. For example, a brisk walk that increases your heart rate and breathing. This should be done on most days of the week.   Find a type of exercise or a variety of exercises that you enjoy so that it becomes a part of your daily life.  Examples are running, walking, swimming, water aerobics, and biking.  For motivation and support, explore group exercise such as aerobic class, spin class, Zumba, Yoga,or  martial arts, etc.    Set exercise goals for yourself, such as a certain weight goal, walk or run in a race such as a 5k walk/run.  Speak to your primary care provider about exercise goals.  Disease prevention:  If you smoke or chew tobacco, find out from your caregiver how to quit. It can literally save your life, no matter how long you have been a tobacco user. If you do not use tobacco, never begin.   Maintain a healthy diet and normal weight. Increased weight leads to problems with blood pressure and diabetes.   The Body Mass Index or BMI is a way of measuring how much of your body is fat. Having a BMI above 27 increases the risk of heart disease, diabetes,  hypertension, stroke and other problems related to obesity. Your caregiver can help determine your BMI and based on it develop an exercise and dietary program to help you achieve or maintain this important measurement at a healthful level.  High blood pressure causes heart and blood vessel problems.  Persistent high blood pressure should be treated with medicine if weight loss and exercise do not work.   Fat and cholesterol leaves deposits in your arteries that can block them. This causes heart disease and vessel disease elsewhere in your body.  If your cholesterol is found to be high, or if you have heart disease or certain other medical conditions, then you may need to have your cholesterol monitored frequently and be treated with medication.   Ask if you should have a cardiac stress test if your history suggests this. A stress test is a test done on a treadmill that looks for heart disease. This test can find disease prior to there being a problem.  Osteoporosis is a disease in which the bones lose minerals and strength as we age. This can result in serious bone fractures. Risk of osteoporosis can be identified using a bone density scan. Men ages 22 and over should discuss this with their caregivers. Ask your caregiver whether you should be taking a calcium supplement and Vitamin D, to reduce the rate of osteoporosis.   Avoid drinking alcohol in excess (more than two drinks per day).  Avoid use of street drugs. Do not share needles with anyone. Ask for professional help if you need assistance or instructions on stopping the use of alcohol, cigarettes, and/or drugs.  Brush your teeth twice a day with fluoride toothpaste, and floss once a day. Good oral hygiene prevents tooth decay and gum disease. The problems can be painful, unattractive, and can cause other health problems. Visit your dentist for a routine oral and dental check up and preventive care every 6-12 months.   Look at your skin  regularly.  Use a mirror to look at your back. Notify your caregivers of changes in moles, especially if there are changes in shapes, colors, a size larger than a pencil eraser, an irregular border, or development of new moles.  Safety:  Use seatbelts 100% of the time, whether driving or as a passenger.  Use safety devices such as hearing protection if you work in environments with loud noise or significant background noise.  Use safety glasses when doing any work that could send debris in to the eyes.  Use a helmet if you ride a bike or motorcycle.  Use appropriate safety gear for contact sports.  Talk to your caregiver about gun safety.  Use sunscreen with a SPF (or skin protection factor) of 15 or greater.  Lighter skinned people are at a greater risk of skin cancer. Don't forget to also wear sunglasses in order to protect your eyes from too much damaging sunlight. Damaging sunlight can accelerate cataract formation.   Practice safe sex. Use condoms. Condoms are used for birth control and to help reduce the spread of sexually transmitted infections (or STIs).  Some of the STIs are gonorrhea (the clap), chlamydia, syphilis, trichomonas, herpes, HPV (human papilloma virus) and HIV (human immunodeficiency virus) which causes AIDS. The herpes, HIV and HPV are viral illnesses that have no cure. These can result in disability, cancer and death.   Keep carbon monoxide and smoke detectors in your home functioning at all times. Change the batteries every 6 months or use a model that plugs into the wall.   Vaccinations:  Stay up to date with your tetanus shots and other required immunizations. You should have a booster for tetanus every 10 years. Be sure to get your flu shot every year, since 5%-20% of the [place] population comes down with the flu. The flu vaccine changes each year, so being vaccinated once is not enough. Get your shot in the fall, before the flu season peaks.   Other vaccines to  consider:  Human Papilloma Virus or HPV causes cancer of the cervix, and other infections that can be transmitted from person to person. There is a vaccine for HPV, and males should get immunized between the ages of 12 and 66. It requires a series of 3 shots.   Pneumococcal vaccine to protect against certain types of pneumonia.  This is normally recommended for adults age 43 or older.  However, adults younger than 53 years old with certain underlying conditions such as diabetes, heart or lung disease  should also receive the vaccine.  Shingles vaccine to protect against Varicella Zoster if you are older than age 53, or younger than 53 years old with certain underlying illness.  If you have not had the Shingrix vaccine, please call your insurer to inquire about coverage for the Shingrix vaccine given in 2 doses.   Some insurers cover this vaccine after age 89, some cover this after age 79.  If your insurer covers this, then call to schedule appointment to have this vaccine here  Hepatitis A vaccine to protect against a form of infection of the liver by a virus acquired from food.  Hepatitis B vaccine to protect against a form of infection of the liver by a virus acquired from blood or body fluids, particularly if you work in health care.  If you plan to travel internationally, check with your local health department for specific vaccination recommendations.   What should I know about cancer screening? Many types of cancers can be detected early and may often be prevented. Lung Cancer  You should be screened every year for lung cancer if: ? You are a current smoker who has smoked for at least 30 years. ? You are a former smoker who has quit within the past 15 years.  Talk to your health care provider about your screening options, when you should start screening, and how often you should be screened.  Colorectal Cancer  Routine colorectal cancer screening usually begins at 53 years of age and  should be repeated every 5-10 years until you are 53 years old. You may need to be screened more often if early forms of precancerous polyps or small growths are found. Your health care provider may recommend screening at an earlier age if you have risk factors for colon cancer.  Your health care provider may recommend using home test kits to check for hidden blood in the stool.  A small camera at the end of a tube can be used to examine your colon (sigmoidoscopy or colonoscopy). This checks for the earliest forms of colorectal cancer.  Prostate and Testicular Cancer  Depending on your age and overall health, your health care provider may do certain tests to screen for prostate and testicular cancer.  Talk to your health care provider about any symptoms or concerns you have about testicular or prostate cancer.  Skin Cancer  Check your skin from head to toe regularly.  Tell your health care provider about any new moles or changes in moles, especially if: ? There is a change in a mole's size, shape, or color. ? You have a mole that is larger than a pencil eraser.  Always use sunscreen. Apply sunscreen liberally and repeat throughout the day.  Protect yourself by wearing long sleeves, pants, a wide-brimmed hat, and sunglasses when outside.

## 2017-11-30 NOTE — Addendum Note (Signed)
Addended by: Edgar Frisk on: 11/30/2017 12:14 PM   Modules accepted: Orders

## 2017-12-01 LAB — GC/CHLAMYDIA PROBE AMP
Chlamydia trachomatis, NAA: NEGATIVE
Neisseria gonorrhoeae by PCR: NEGATIVE

## 2017-12-01 LAB — HEPATITIS C ANTIBODY: Hep C Virus Ab: <0.1 s/co ratio (ref 0.0–0.9)

## 2017-12-01 LAB — PSA: Prostate Specific Ag, Serum: 0.6 ng/mL (ref 0.0–4.0)

## 2017-12-01 LAB — RPR: RPR Ser Ql: NONREACTIVE

## 2017-12-01 LAB — HIV ANTIBODY (ROUTINE TESTING W REFLEX): HIV SCREEN 4TH GENERATION: NONREACTIVE

## 2017-12-08 ENCOUNTER — Encounter: Payer: Self-pay | Admitting: Internal Medicine

## 2017-12-21 DIAGNOSIS — I69319 Unspecified symptoms and signs involving cognitive functions following cerebral infarction: Secondary | ICD-10-CM | POA: Diagnosis not present

## 2017-12-21 DIAGNOSIS — I6523 Occlusion and stenosis of bilateral carotid arteries: Secondary | ICD-10-CM | POA: Diagnosis not present

## 2017-12-21 DIAGNOSIS — F17209 Nicotine dependence, unspecified, with unspecified nicotine-induced disorders: Secondary | ICD-10-CM | POA: Diagnosis not present

## 2017-12-21 DIAGNOSIS — I10 Essential (primary) hypertension: Secondary | ICD-10-CM | POA: Diagnosis not present

## 2017-12-21 DIAGNOSIS — I119 Hypertensive heart disease without heart failure: Secondary | ICD-10-CM | POA: Diagnosis not present

## 2018-01-03 ENCOUNTER — Encounter: Payer: Self-pay | Admitting: Medical

## 2018-01-18 DIAGNOSIS — I6523 Occlusion and stenosis of bilateral carotid arteries: Secondary | ICD-10-CM | POA: Diagnosis not present

## 2018-01-18 DIAGNOSIS — R9431 Abnormal electrocardiogram [ECG] [EKG]: Secondary | ICD-10-CM | POA: Diagnosis not present

## 2018-01-18 DIAGNOSIS — I10 Essential (primary) hypertension: Secondary | ICD-10-CM | POA: Diagnosis not present

## 2018-01-24 ENCOUNTER — Encounter: Payer: Self-pay | Admitting: Medical

## 2018-01-25 DIAGNOSIS — I739 Peripheral vascular disease, unspecified: Secondary | ICD-10-CM | POA: Diagnosis not present

## 2018-01-30 ENCOUNTER — Encounter: Payer: Self-pay | Admitting: Medical

## 2018-02-02 ENCOUNTER — Encounter: Payer: Self-pay | Admitting: Medical

## 2018-11-14 ENCOUNTER — Other Ambulatory Visit: Payer: Self-pay

## 2018-11-14 ENCOUNTER — Ambulatory Visit (INDEPENDENT_AMBULATORY_CARE_PROVIDER_SITE_OTHER): Payer: BLUE CROSS/BLUE SHIELD | Admitting: Medical

## 2018-11-14 ENCOUNTER — Encounter: Payer: Self-pay | Admitting: Medical

## 2018-11-14 VITALS — BP 130/80 | HR 86 | Temp 98.4°F | Resp 16 | Ht 68.5 in | Wt 189.0 lb

## 2018-11-14 DIAGNOSIS — I10 Essential (primary) hypertension: Secondary | ICD-10-CM | POA: Diagnosis not present

## 2018-11-14 DIAGNOSIS — R202 Paresthesia of skin: Secondary | ICD-10-CM

## 2018-11-14 DIAGNOSIS — S46211A Strain of muscle, fascia and tendon of other parts of biceps, right arm, initial encounter: Secondary | ICD-10-CM

## 2018-11-14 DIAGNOSIS — M7989 Other specified soft tissue disorders: Secondary | ICD-10-CM | POA: Diagnosis not present

## 2018-11-14 DIAGNOSIS — Z8673 Personal history of transient ischemic attack (TIA), and cerebral infarction without residual deficits: Secondary | ICD-10-CM

## 2018-11-14 DIAGNOSIS — F17200 Nicotine dependence, unspecified, uncomplicated: Secondary | ICD-10-CM

## 2018-11-14 DIAGNOSIS — E785 Hyperlipidemia, unspecified: Secondary | ICD-10-CM

## 2018-11-14 MED ORDER — LOVASTATIN 40 MG PO TABS: 40.0000 mg | ORAL_TABLET | Freq: Every day | ORAL | 0 refills | Status: DC

## 2018-11-14 NOTE — Progress Notes (Signed)
Subjective:     Patient ID: Shane Valentine, male   DOB: 03-Nov-1965, 53 y.o.   MRN: [id number]  HPI Chief Complaint  Patient presents with  . left arm weakness    left arm weakness  ? arthritis hands cramping and weak X 1 month   He has a health history significant for prior stroke, hyperlipidemia, hypertension, prior paresthesias of the hands.  Last visit was almost a year ago.  He was advised to return in a month after his last visit but did not follow-up.  He reports when he raises right arm, feeling soreness in arm.  He knows its not from working out.   Feels weak in both arms, but right arm worse.  Has noted this x 1 month.  No recent slurred speech, no facial changes , no numbness, no vision or hearing change.  No weakness in legs.  No confusion.  No recent fall or injury.  Wonders aobut a pulled muscle.  He works in [employer] and does lift things on the job.   Recently lifted a water heater but didn't feel an injury at the time.  No other known injury or heavy lifting.    Feels like he has fluid in hands, feels swollen in hands x 1 months.  Wonders about arthritis.  Does have some joint pains.  He notes gaining some weight in last month from eating better.  Hypertension- he reports that he is taking his amlodipine 5 mg daily, lisinopril HCT 20/25 mg daily, metoprolol 100 mg twice daily.  No chest pain, no edema, no palpitations.  Compliant with aspirin, taking 325 mg daily.  He is not taking lovastatin cholesterol medication, as it ran out.  Tobacco use- still smoking but down to 1/2 ppd.  Working on trying to quit smoking.    Past Medical History:  Diagnosis Date  . GERD (gastroesophageal reflux disease)   . Headache   . Hyperlipidemia   . Hypertension   . Paresthesia of hand, bilateral   . Smoker   . Stroke ([place])   . Tuberculosis 1995   73mo therapy,    Current Outpatient Medications on File Prior to Visit  Medication Sig Dispense Refill  . amLODipine (NORVASC)  5 MG tablet Take 1 tablet (5 mg total) by mouth daily. 90 tablet 3  . aspirin 325 MG tablet Take 325 mg by mouth daily.    Marland Kitchen lisinopril-hydrochlorothiazide (PRINZIDE,ZESTORETIC) 20-25 MG tablet Take 1 tablet by mouth daily. 90 tablet 3  . metoprolol tartrate (LOPRESSOR) 100 MG tablet Take 1 tablet (100 mg total) by mouth 2 (two) times daily. 180 tablet 3   No current facility-administered medications on file prior to visit.     Review of Systems As in subjective    Objective:   Physical Exam  Wt Readings from Last 3 Encounters:  11/14/18 189 lb (85.7 kg)  11/30/17 176 lb (79.8 kg)  08/29/17 183 lb (83 kg)   BP 130/80   Pulse 86   Temp 98.4 F (36.9 C) (Oral)   Resp 16   Ht 5' 8.5" (1.74 m)   Wt 189 lb (85.7 kg)   SpO2 98%   BMI 28.32 kg/m     General appearance: alert, no distress, WD/WN, AA male Neck: supple, no lymphadenopathy, no thyromegaly, no masses, no bruits Heart: RRR, normal S1, S2, no murmurs Lungs: CTA bilaterally, no wheezes, rhonchi, or rales Musculoskeletal: He has mild tenderness of the right lateral bicep and mild pain with resisted motion  of the bicep otherwise arms with normal range of motion, no deformity, nontender, no obvious hand swelling or deformity either.   Extremities: no obvious edema, no cyanosis, no clubbing Pulses: Pedal pulses 1+, upper extremity pulses 2+, normal cap refill  neurological: alert, oriented x 3, CN2-12 intact, strength normal upper extremities and lower extremities, sensation normal throughout, DTRs 2+ throughout, no cerebellar signs, gait normal, negative Phalen's and Tinel's Psychiatric: normal affect, behavior normal, pleasant        Assessment:     Encounter Diagnoses  Name Primary?  . Biceps strain, right, initial encounter Yes  . Swelling of both hands   . Paresthesia of both hands   . Essential hypertension   . History of stroke   . Hyperlipidemia, unspecified hyperlipidemia type   . Smoker        Plan:      His exam findings and symptoms suggest bicep strain-advised relative rest, no heavy lifting, and being careful not to over stress the biceps over the next week.  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  Advised ibuprofen the counter short-term for the next 3 to 4 days.  Can use heat pad and gentle stretching.  If not much improved within a week to let me know  He has complaints of swelling of both hands and paresthesias although his hand and finger exam is normal.  We discussed possible causes of his symptoms although is not completely obvious.  I suspect he is having mild generalized swelling from salt intake or diet changes but again no obvious swelling on exam.  This may just be his perception.  He does do manual labor so we discussed the possibility of mild carpal tunnel syndrome.  We discussed the diagnosis of carpal tunnel syndrome, possible treatments including relative rest, and using nighttime wrist splints over-the-counter.  Restart statin, continue same blood pressure medications and aspirin daily.  Return fasting for physical in May 2020  Sumeet was seen today for left arm weakness.  Diagnoses and all orders for this visit:  Biceps strain, right, initial encounter  Swelling of both hands  Paresthesia of both hands  Essential hypertension  History of stroke  Hyperlipidemia, unspecified hyperlipidemia type  Smoker  Other orders -     lovastatin (MEVACOR) 40 MG tablet; Take 1 tablet (40 mg total) by mouth at bedtime.

## 2018-12-18 ENCOUNTER — Other Ambulatory Visit: Payer: Self-pay

## 2018-12-18 ENCOUNTER — Encounter: Payer: Self-pay | Admitting: Medical

## 2018-12-18 ENCOUNTER — Other Ambulatory Visit: Payer: Self-pay | Admitting: Medical

## 2018-12-18 ENCOUNTER — Ambulatory Visit (INDEPENDENT_AMBULATORY_CARE_PROVIDER_SITE_OTHER): Payer: BLUE CROSS/BLUE SHIELD | Admitting: Medical

## 2018-12-18 VITALS — BP 138/86 | HR 58 | Temp 98.1°F | Resp 16 | Ht 69.0 in | Wt 189.9 lb

## 2018-12-18 DIAGNOSIS — A630 Anogenital (venereal) warts: Secondary | ICD-10-CM

## 2018-12-18 DIAGNOSIS — M25511 Pain in right shoulder: Secondary | ICD-10-CM

## 2018-12-18 DIAGNOSIS — Z0000 Encounter for general adult medical examination without abnormal findings: Secondary | ICD-10-CM | POA: Diagnosis not present

## 2018-12-18 DIAGNOSIS — Z7189 Other specified counseling: Secondary | ICD-10-CM

## 2018-12-18 DIAGNOSIS — I10 Essential (primary) hypertension: Secondary | ICD-10-CM

## 2018-12-18 DIAGNOSIS — N529 Male erectile dysfunction, unspecified: Secondary | ICD-10-CM

## 2018-12-18 DIAGNOSIS — Z113 Encounter for screening for infections with a predominantly sexual mode of transmission: Secondary | ICD-10-CM | POA: Diagnosis not present

## 2018-12-18 DIAGNOSIS — R200 Anesthesia of skin: Secondary | ICD-10-CM

## 2018-12-18 DIAGNOSIS — Z1211 Encounter for screening for malignant neoplasm of colon: Secondary | ICD-10-CM

## 2018-12-18 DIAGNOSIS — Z7185 Encounter for immunization safety counseling: Secondary | ICD-10-CM

## 2018-12-18 DIAGNOSIS — Z8673 Personal history of transient ischemic attack (TIA), and cerebral infarction without residual deficits: Secondary | ICD-10-CM

## 2018-12-18 DIAGNOSIS — E785 Hyperlipidemia, unspecified: Secondary | ICD-10-CM | POA: Diagnosis not present

## 2018-12-18 DIAGNOSIS — F17200 Nicotine dependence, unspecified, uncomplicated: Secondary | ICD-10-CM

## 2018-12-18 DIAGNOSIS — R202 Paresthesia of skin: Secondary | ICD-10-CM

## 2018-12-18 NOTE — Progress Notes (Signed)
Subjective:   HPI  Shane Valentine is a 53 y.o. male who presents for Chief Complaint  Patient presents with  . CPE    Fasting CPE     Patient Care Team: Meridee Branum, Camelia Eng, PA-C as PCP - General (Family Medicine) Sees dentist Sees eye doctor  Concerns: Last 2 months having worse numbness in both hands, some in right shoulder.   Also having some right shoulder pains, intermittent.    Still has concerns about warts on penis, wants to go see dermatology  Reviewed their medical, surgical, family, social, medication, and allergy history and updated chart as appropriate.  Past Medical History:  Diagnosis Date  . GERD (gastroesophageal reflux disease)   . Headache   . Hyperlipidemia   . Hypertension   . Paresthesia of hand, bilateral   . Smoker   . Stroke ([place])   . Tuberculosis 1995   40mo therapy, [place]    Past Surgical History:  Procedure Laterality Date  . INGUINAL HERNIA REPAIR Left 03/29/2014   Procedure: LAPAROSCOPIC LEFT INGUINAL HERNIA REPAIR;  Surgeon: Ralene Ok, MD;  Location: [facility];  Service: General;  Laterality: Left;  . INSERTION OF MESH Left 03/29/2014   Procedure: INSERTION OF MESH;  Surgeon: Ralene Ok, MD;  Location: [facility];  Service: General;  Laterality: Left;    Social History   Socioeconomic History  . Marital status: Single    Spouse name: Not on file  . Number of children: Not on file  . Years of education: Not on file  . Highest education level: Not on file  Occupational History  . Not on file  Social Needs  . Financial resource strain: Not on file  . Food insecurity:    Worry: Not on file    Inability: Not on file  . Transportation needs:    Medical: Not on file    Non-medical: Not on file  Tobacco Use  . Smoking status: Current Every Day Smoker    Packs/day: 0.50    Years: 23.00    Pack years: 11.50    Types: Cigarettes  . Smokeless tobacco: Never Used  Substance and Sexual Activity  . Alcohol use: No  . Drug use:  Yes    Types: Marijuana    Comment: 03/28/14  --last time 03/27/14  . Sexual activity: Never  Lifestyle  . Physical activity:    Days per week: Not on file    Minutes per session: Not on file  . Stress: Not on file  Relationships  . Social connections:    Talks on phone: Not on file    Gets together: Not on file    Attends religious service: Not on file    Active member of club or organization: Not on file    Attends meetings of clubs or organizations: Not on file    Relationship status: Not on file  . Intimate partner violence:    Fear of current or ex partner: Not on file    Emotionally abused: Not on file    Physically abused: Not on file    Forced sexual activity: Not on file  Other Topics Concern  . Not on file  Social History Narrative   Uses jump rope and some dumbbells regularly.   No significant other.   Lives alone.   Works [employer], HVAC, Teacher, English as a foreign language.  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  12/2018    Family History  Problem Relation Age of Onset  . Diabetes  Mother        amputations  . Hypertension Mother   . Cancer Father   . COPD Brother   . Heart disease Neg Hx   . Stroke Neg Hx      Current Outpatient Medications:  .  aspirin 325 MG tablet, Take 325 mg by mouth daily., Disp: , Rfl:  .  lisinopril-hydrochlorothiazide (PRINZIDE,ZESTORETIC) 20-25 MG tablet, Take 1 tablet by mouth daily., Disp: 90 tablet, Rfl: 3 .  lovastatin (MEVACOR) 40 MG tablet, Take 1 tablet (40 mg total) by mouth at bedtime., Disp: 90 tablet, Rfl: 0 .  metoprolol tartrate (LOPRESSOR) 100 MG tablet, Take 1 tablet (100 mg total) by mouth 2 (two) times daily., Disp: 180 tablet, Rfl: 3 .  amLODipine (NORVASC) 5 MG tablet, Take 1 tablet (5 mg total) by mouth daily. (Patient not taking: Reported on 12/18/2018), Disp: 90 tablet, Rfl: 3  No Known Allergies     Review of Systems Constitutional: -fever, -chills, -sweats, -unexpected weight change, -decreased appetite,  -fatigue Allergy: -sneezing, -itching, -congestion Dermatology: -changing moles, --rash, -lumps ENT: -runny nose, -ear pain, -sore throat, -hoarseness, -sinus pain, -teeth pain, - ringing in ears, -hearing loss, -nosebleeds Cardiology: -chest pain, -palpitations, -swelling, -difficulty breathing when lying flat, -waking up short of breath Respiratory: -cough, -shortness of breath, -difficulty breathing with exercise or exertion, -wheezing, -coughing up blood Gastroenterology: -abdominal pain, -nausea, -vomiting, -diarrhea, -constipation, -blood in stool, -changes in bowel movement, -difficulty swallowing or eating Hematology: -bleeding, -bruising  Musculoskeletal: -joint aches, -muscle aches, -joint swelling, -back pain, -neck pain, -cramping, -changes in gait Ophthalmology: denies vision changes, eye redness, itching, discharge Urology: -burning with urination, -difficulty urinating, -blood in urine, -urinary frequency, -urgency, -incontinence Neurology: -headache,+weakness,+tingling, +numbness, -memory loss, -falls, -dizziness Psychology: -depressed mood, -agitation, -sleep problems Male GU: no testicular mass, pain, no lymph nodes swollen, no swelling, no rash.     Objective:  BP 138/86   Pulse (!) 58   Temp 98.1 F (36.7 C) (Oral)   Resp 16   Ht 5\' 9"  (1.753 m)   Wt 189 lb 14.4 oz (86.1 kg)   SpO2 98%   BMI 28.04 kg/m   General appearance: alert, no distress, WD/WN, AA male Skin: tattoo left upper chest, genital warts as below in GU HEENT: normocephalic, conjunctiva/corneas normal, sclerae anicteric, PERRLA, EOMi, nares patent, no discharge or erythema, pharynx normal Oral cavity: MMM, tongue normal, teeth in good repair Neck: supple, no lymphadenopathy, no thyromegaly, no masses, normal ROM, no bruits Chest: non tender, normal shape and expansion Heart: RRR, normal S1, S2, no murmurs Lungs: CTA bilaterally, no wheezes, rhonchi, or rales Abdomen: +bs, soft, non tender, non  distended, no masses, no hepatomegaly, no splenomegaly, no bruits Back: some mild bony arthritis changes of hands in general, pain with resisted right shoulder abduction, otherwise nontender, no swelling, no deformity, otherwise non tender, normal ROM, no scoliosis Musculoskeletal: upper extremities non tender, no obvious deformity, normal ROM throughout, lower extremities non tender, no obvious deformity, normal ROM throughout Extremities: no edema, no cyanosis, no clubbing Pulses: 2+ symmetric, upper and lower extremities, normal cap refill Neurological: alert, oriented x 3, CN2-12 intact, strength normal upper extremities and lower extremities, sensation normal throughout, DTRs 2+ throughout, no cerebellar signs, gait normal Psychiatric: normal affect, behavior normal, pleasant  GU: normal male external genitalia, circumcised, dorsal distal shaft and distal dorsolateral shaft with approx 3 separate raised verruca 3-19mm x 2 mm lesions suggestive of warts, non tender, no masses, no hernia, no lymphadenopathy Rectal: declined  EKG Indication HTN, rate 51 bpm, PR interval 133ms, QRS 42ms, QTc 39ms, axis 61 degrees, sinus bradycardia, possible left atrial enlargement    Assessment and Plan :   Encounter Diagnoses  Name Primary?  . Encounter for health maintenance examination in adult Yes  . Essential hypertension   . Genital warts   . Bilateral hand numbness   . Erectile dysfunction, unspecified erectile dysfunction type   . History of stroke   . Hyperlipidemia, unspecified hyperlipidemia type   . Vaccine counseling   . Smoker   . Paresthesia   . Right shoulder pain, unspecified chronicity   . Screen for colon cancer   . Screen for STD (sexually transmitted disease)     Physical exam - discussed and counseled on healthy lifestyle, diet, exercise, preventative care, vaccinations, sick and well care, proper use of emergency dept and after hours care, and addressed their concerns.     Health screening: See your eye doctor yearly for routine vision care. See your dentist yearly for routine dental care including hygiene visits twice yearly.  Discussed STD testing, discussed prevention, condom use, means of transmission  Cancer screening Colonoscopy:  Referred for screening cologuard  Discussed PSA, prostate exam, and prostate cancer screening risks/benefits.   PSA normal last year, repeat 2021   Vaccinations: Advised yearly influenza vaccine Counseled on Shingles vaccine at age 65 years and older Patient will check insurance coverage for this and consider vaccination   Separate significant chronic issues discussed: Hyperlipidemia - c/t current medication, labs today  HTN - c/t current medications, labs today, EKG reviewed  Smoker - c/t efforts to stop completely  Genital warts - referral to dermatology  Screen for colon cancer - referral to cologuard  Right shoulder pain - go for xray  bilat hand pain and numbness - possible carpal tunnel, go for hand xray   Bertrum was seen today for cpe.  Diagnoses and all orders for this visit:  Encounter for health maintenance examination in adult -     EKG 12-Lead -     Comprehensive metabolic panel -     CBC with Differential/Platelet -     Lipid panel -     HIV Antibody (routine testing w rflx) -     RPR -     GC/Chlamydia Probe Amp  Essential hypertension -     EKG 12-Lead  Genital warts -     Ambulatory referral to Dermatology  Bilateral hand numbness -     DG HAND 2 VIEWS BILAT  Erectile dysfunction, unspecified erectile dysfunction type  History of stroke  Hyperlipidemia, unspecified hyperlipidemia type -     EKG 12-Lead -     Lipid panel  Vaccine counseling  Smoker  Paresthesia -     DG HAND 2 VIEWS BILAT  Right shoulder pain, unspecified chronicity -     DG Shoulder Right; Future  Screen for colon cancer  Screen for STD (sexually transmitted disease) -     HIV Antibody  (routine testing w rflx) -     RPR -     GC/Chlamydia Probe Amp    Follow-up pending labs, yearly for physical

## 2018-12-18 NOTE — Patient Instructions (Signed)
Please go to [facility] for your right shoulder and bilateral hand xray.   Their hours are 8am - 4:30 pm Monday - Friday.  Take your insurance card with you.  [facility] [phone]  [address], [place], [place] [address]  [address] [place], [place] [address]

## 2018-12-19 LAB — LIPID PANEL
Chol/HDL Ratio: 3.6 ratio (ref 0.0–5.0)
Cholesterol, Total: 167 mg/dL (ref 100–199)
HDL: 46 mg/dL (ref >39)
LDL Calculated: 105 mg/dL — ABNORMAL HIGH (ref 0–99)
Triglycerides: 82 mg/dL (ref 0–149)
VLDL Cholesterol Cal: 16 mg/dL (ref 5–40)

## 2018-12-19 LAB — COMPREHENSIVE METABOLIC PANEL
ALT: 13 IU/L (ref 0–44)
AST: 13 IU/L (ref 0–40)
Albumin/Globulin Ratio: 2.1 (ref 1.2–2.2)
Albumin: 4.5 g/dL (ref 3.8–4.9)
Alkaline Phosphatase: 74 IU/L (ref 39–117)
BUN/Creatinine Ratio: 16 (ref 9–20)
BUN: 16 mg/dL (ref 6–24)
Bilirubin Total: 0.3 mg/dL (ref 0.0–1.2)
CO2: 25 mmol/L (ref 20–29)
Calcium: 9.6 mg/dL (ref 8.7–10.2)
Chloride: 105 mmol/L (ref 96–106)
Creatinine, Ser: 1.03 mg/dL (ref 0.76–1.27)
GFR calc Af Amer: 95 mL/min/{1.73_m2} (ref >59)
GFR calc non Af Amer: 83 mL/min/{1.73_m2} (ref >59)
Globulin, Total: 2.1 g/dL (ref 1.5–4.5)
Glucose: 95 mg/dL (ref 65–99)
Potassium: 4.2 mmol/L (ref 3.5–5.2)
Sodium: 143 mmol/L (ref 134–144)
Total Protein: 6.6 g/dL (ref 6.0–8.5)

## 2018-12-19 LAB — CBC WITH DIFFERENTIAL/PLATELET
Basophils Absolute: 0.1 10*3/uL (ref 0.0–0.2)
Basos: 1 %
EOS (ABSOLUTE): 0 10*3/uL (ref 0.0–0.4)
Eos: 0 %
Hematocrit: 41.7 % (ref 37.5–51.0)
Hemoglobin: 14 g/dL (ref 13.0–17.7)
Immature Grans (Abs): 0 10*3/uL (ref 0.0–0.1)
Immature Granulocytes: 0 %
Lymphocytes Absolute: 2 10*3/uL (ref 0.7–3.1)
Lymphs: 26 %
MCH: 31.6 pg (ref 26.6–33.0)
MCHC: 33.6 g/dL (ref 31.5–35.7)
MCV: 94 fL (ref 79–97)
Monocytes Absolute: 0.6 10*3/uL (ref 0.1–0.9)
Monocytes: 8 %
Neutrophils Absolute: 5 10*3/uL (ref 1.4–7.0)
Neutrophils: 65 %
Platelets: 150 10*3/uL (ref 150–450)
RBC: 4.43 x10E6/uL (ref 4.14–5.80)
RDW: 12.7 % (ref 11.6–15.4)
WBC: 7.8 10*3/uL (ref 3.4–10.8)

## 2018-12-19 LAB — RPR: RPR Ser Ql: NONREACTIVE

## 2018-12-19 LAB — HIV ANTIBODY (ROUTINE TESTING W REFLEX): HIV Screen 4th Generation wRfx: NONREACTIVE

## 2018-12-23 ENCOUNTER — Other Ambulatory Visit: Payer: Self-pay | Admitting: Medical

## 2018-12-25 ENCOUNTER — Encounter: Payer: Self-pay | Admitting: Medical

## 2018-12-25 ENCOUNTER — Other Ambulatory Visit: Payer: Self-pay | Admitting: Medical

## 2018-12-25 LAB — GC/CHLAMYDIA PROBE AMP
Chlamydia trachomatis, NAA: NEGATIVE
Neisseria Gonorrhoeae by PCR: NEGATIVE

## 2018-12-25 MED ORDER — ASPIRIN EC 81 MG PO TBEC: 81.0000 mg | DELAYED_RELEASE_TABLET | Freq: Every day | ORAL | 3 refills | Status: DC

## 2018-12-25 MED ORDER — METOPROLOL TARTRATE 100 MG PO TABS: 100.0000 mg | ORAL_TABLET | Freq: Two times a day (BID) | ORAL | 3 refills | Status: DC

## 2018-12-25 MED ORDER — AMLODIPINE BESYLATE 5 MG PO TABS: 5.0000 mg | ORAL_TABLET | Freq: Every day | ORAL | 3 refills | Status: DC

## 2019-03-14 DIAGNOSIS — A630 Anogenital (venereal) warts: Secondary | ICD-10-CM | POA: Diagnosis not present

## 2019-06-03 ENCOUNTER — Other Ambulatory Visit: Payer: Self-pay | Admitting: Medical

## 2019-08-24 DIAGNOSIS — Z20828 Contact with and (suspected) exposure to other viral communicable diseases: Secondary | ICD-10-CM | POA: Diagnosis not present

## 2019-09-05 DIAGNOSIS — Z20828 Contact with and (suspected) exposure to other viral communicable diseases: Secondary | ICD-10-CM | POA: Diagnosis not present

## 2019-12-03 ENCOUNTER — Other Ambulatory Visit: Payer: Self-pay

## 2019-12-03 ENCOUNTER — Other Ambulatory Visit: Payer: Self-pay | Admitting: Medical

## 2019-12-26 ENCOUNTER — Ambulatory Visit: Payer: BC Managed Care – PPO | Admitting: Medical

## 2019-12-26 ENCOUNTER — Encounter: Payer: Self-pay | Admitting: Medical

## 2019-12-26 ENCOUNTER — Other Ambulatory Visit: Payer: Self-pay

## 2019-12-26 VITALS — BP 132/90 | HR 68 | Temp 97.8°F | Ht 68.5 in | Wt 184.6 lb

## 2019-12-26 DIAGNOSIS — Z1211 Encounter for screening for malignant neoplasm of colon: Secondary | ICD-10-CM

## 2019-12-26 DIAGNOSIS — E785 Hyperlipidemia, unspecified: Secondary | ICD-10-CM | POA: Diagnosis not present

## 2019-12-26 DIAGNOSIS — N529 Male erectile dysfunction, unspecified: Secondary | ICD-10-CM

## 2019-12-26 DIAGNOSIS — I10 Essential (primary) hypertension: Secondary | ICD-10-CM | POA: Diagnosis not present

## 2019-12-26 DIAGNOSIS — Z7189 Other specified counseling: Secondary | ICD-10-CM

## 2019-12-26 DIAGNOSIS — M5442 Lumbago with sciatica, left side: Secondary | ICD-10-CM | POA: Diagnosis not present

## 2019-12-26 DIAGNOSIS — F17200 Nicotine dependence, unspecified, uncomplicated: Secondary | ICD-10-CM

## 2019-12-26 DIAGNOSIS — Z125 Encounter for screening for malignant neoplasm of prostate: Secondary | ICD-10-CM

## 2019-12-26 DIAGNOSIS — Z7185 Encounter for immunization safety counseling: Secondary | ICD-10-CM

## 2019-12-26 DIAGNOSIS — Z8673 Personal history of transient ischemic attack (TIA), and cerebral infarction without residual deficits: Secondary | ICD-10-CM

## 2019-12-26 DIAGNOSIS — Z0000 Encounter for general adult medical examination without abnormal findings: Secondary | ICD-10-CM

## 2019-12-26 DIAGNOSIS — E876 Hypokalemia: Secondary | ICD-10-CM

## 2019-12-26 DIAGNOSIS — I739 Peripheral vascular disease, unspecified: Secondary | ICD-10-CM

## 2019-12-26 DIAGNOSIS — R6882 Decreased libido: Secondary | ICD-10-CM | POA: Insufficient documentation

## 2019-12-26 DIAGNOSIS — G8929 Other chronic pain: Secondary | ICD-10-CM

## 2019-12-26 MED ORDER — ASPIRIN EC 325 MG PO TBEC
325.0000 mg | DELAYED_RELEASE_TABLET | Freq: Every day | ORAL | 3 refills | Status: DC
Start: 2019-12-26 — End: 2020-05-08

## 2019-12-26 MED ORDER — ROSUVASTATIN CALCIUM 20 MG PO TABS: 20.0000 mg | ORAL_TABLET | Freq: Every day | ORAL | 3 refills | Status: DC

## 2019-12-26 MED ORDER — AMLODIPINE BESYLATE 10 MG PO TABS
10.0000 mg | ORAL_TABLET | Freq: Every day | ORAL | 3 refills | Status: DC
Start: 2019-12-26 — End: 2021-04-24

## 2019-12-26 MED ORDER — METOPROLOL SUCCINATE ER 100 MG PO TB24: 100.0000 mg | ORAL_TABLET | Freq: Every day | ORAL | 3 refills | Status: DC

## 2019-12-26 NOTE — Progress Notes (Signed)
Subjective:   HPI  Shane Valentine is a 54 y.o. male who presents for Chief Complaint  Patient presents with  . Annual Exam    CPE no other issues    Patient Care Team: Kenzly Rogoff, Camelia Eng, PA-C as PCP - General (Family Medicine) Sees dentist Sees eye doctor  Concerns: He saw dermatology last year for genital and anal warts but they felt like they could not handle those lesions.  But they did not refer him to anyone else.  This they are still giving him problems.  He has been out of his cholesterol medicine recently.  He is compliant with blood pressure medicine.  No chest pain or difficulty breathing, no new complaints  He still smokes.  He is working full-time  Reviewed their medical, surgical, family, social, medication, and allergy history and updated chart as appropriate.  Past Medical History:  Diagnosis Date  . GERD (gastroesophageal reflux disease)   . Headache   . Hyperlipidemia   . Hypertension   . Paresthesia of hand, bilateral   . Smoker   . Stroke ([place])   . Tuberculosis 1995   54mo therapy, [facility]    Past Surgical History:  Procedure Laterality Date  . INGUINAL HERNIA REPAIR Left 03/29/2014   Procedure: LAPAROSCOPIC LEFT INGUINAL HERNIA REPAIR;  Surgeon: Ralene Ok, MD;  Location: [facility];  Service: General;  Laterality: Left;  . INSERTION OF MESH Left 03/29/2014   Procedure: INSERTION OF MESH;  Surgeon: Ralene Ok, MD;  Location: [facility];  Service: General;  Laterality: Left;    Social History   Socioeconomic History  . Marital status: Single    Spouse name: Not on file  . Number of children: Not on file  . Years of education: Not on file  . Highest education level: Not on file  Occupational History  . Not on file  Tobacco Use  . Smoking status: Current Every Day Smoker    Packs/day: 0.50    Years: 23.00    Pack years: 11.50    Types: Cigarettes  . Smokeless tobacco: Never Used  Substance and Sexual Activity  . Alcohol use: No  . Drug  use: Yes    Types: Marijuana    Comment: 03/28/14  --last time 03/27/14  . Sexual activity: Never  Other Topics Concern  . Not on file  Social History Narrative   Uses jump rope and some dumbbells regularly.   No significant other.   Lives alone.   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  12/2019   Social Determinants of Health   Financial Resource Strain:   . Difficulty of Paying Living Expenses:   Food Insecurity:   . Worried About Charity fundraiser in the Last Year:   . Arboriculturist in the Last Year:   Transportation Needs:   . Film/video editor (Medical):   Marland Kitchen Lack of Transportation (Non-Medical):   Physical Activity:   . Days of Exercise per Week:   . Minutes of Exercise per Session:   Stress:   . Feeling of Stress :   Social Connections:   . Frequency of Communication with Friends and Family:   . Frequency of Social Gatherings with Friends and Family:   . Attends Religious Services:   . Active Member of Clubs or Organizations:   . Attends Archivist Meetings:   Marland Kitchen Marital Status:   Intimate Partner Violence:   . Fear  of Current or Ex-Partner:   . Emotionally Abused:   Marland Kitchen Physically Abused:   . Sexually Abused:     Family History  Problem Relation Age of Onset  . Diabetes Mother        amputations  . Hypertension Mother   . Cancer Father   . COPD Brother   . Heart disease Neg Hx   . Stroke Neg Hx      Current Outpatient Medications:  .  aspirin EC 81 MG tablet, Take 1 tablet (81 mg total) by mouth daily., Disp: 90 tablet, Rfl: 3 .  lisinopril-hydrochlorothiazide (ZESTORETIC) 20-25 MG tablet, Take 1 tablet by mouth once daily, Disp: 90 tablet, Rfl: 0 .  metoprolol tartrate (LOPRESSOR) 100 MG tablet, Take 1 tablet (100 mg total) by mouth 2 (two) times daily., Disp: 180 tablet, Rfl: 3 .  amLODipine (NORVASC) 10 MG tablet, Take 1 tablet (10 mg total) by mouth daily., Disp: 90 tablet, Rfl: 3 .   aspirin EC 325 MG tablet, Take 1 tablet (325 mg total) by mouth daily., Disp: 90 tablet, Rfl: 3 .  metoprolol succinate (TOPROL XL) 100 MG 24 hr tablet, Take 1 tablet (100 mg total) by mouth daily. Take with or immediately following a meal., Disp: 90 tablet, Rfl: 3 .  rosuvastatin (CRESTOR) 20 MG tablet, Take 1 tablet (20 mg total) by mouth daily., Disp: 90 tablet, Rfl: 3  No Known Allergies   Review of Systems Constitutional: -fever, -chills, -sweats, -unexpected weight change, -decreased appetite, -fatigue Allergy: -sneezing, -itching, -congestion Dermatology: -changing moles, --rash, -lumps ENT: -runny nose, -ear pain, -sore throat, -hoarseness, -sinus pain, -teeth pain, - ringing in ears, -hearing loss, -nosebleeds Cardiology: -chest pain, -palpitations, -swelling, -difficulty breathing when lying flat, -waking up short of breath Respiratory: -cough, -shortness of breath, -difficulty breathing with exercise or exertion, -wheezing, -coughing up blood Gastroenterology: -abdominal pain, -nausea, -vomiting, -diarrhea, -constipation, -blood in stool, -changes in bowel movement, -difficulty swallowing or eating Hematology: -bleeding, -bruising  Musculoskeletal: -joint aches, -muscle aches, -joint swelling, -back pain, -neck pain, -cramping, -changes in gait Ophthalmology: denies vision changes, eye redness, itching, discharge Urology: -burning with urination, -difficulty urinating, -blood in urine, -urinary frequency, -urgency, -incontinence Neurology: -headache, no numbness tingling or weakness,-memory loss, -falls, -dizziness Psychology: -depressed mood, -agitation, -sleep problems Male GU: no testicular mass, pain, no lymph nodes swollen, no swelling, no rash.       Objective:  BP 132/90   Pulse 68   Temp 97.8 F (36.6 C)   Ht 5' 8.5" (1.74 m)   Wt 184 lb 9.6 oz (83.7 kg)   BMI 27.66 kg/m   Wt Readings from Last 3 Encounters:  12/26/19 184 lb 9.6 oz (83.7 kg)  12/18/18 189 lb  14.4 oz (86.1 kg)  11/14/18 189 lb (85.7 kg)   BP Readings from Last 3 Encounters:  12/26/19 132/90  12/18/18 138/86  11/14/18 130/80   General appearance: alert, no distress, WD/WN, African-American male Skin: tattoo left upper chest, anal and genital warts as below in GU Neck: supple, no lymphadenopathy, no thyromegaly, no masses, normal ROM, no bruits Chest: non tender, normal shape and expansion Heart: RRR, normal S1, S2, no murmurs Lungs: CTA bilaterally, no wheezes, rhonchi, or rales Abdomen: +bs, soft, non tender, non distended, no masses, no hepatomegaly, no splenomegaly, no bruits Back: some mild bony arthritis changes of hands in general, pain with resisted right shoulder abduction, otherwise nontender, no swelling, no deformity, otherwise non tender, normal ROM, no scoliosis Musculoskeletal: upper extremities non  tender, no obvious deformity, normal ROM throughout, lower extremities non tender, no obvious deformity, normal ROM throughout Extremities: no edema, no cyanosis, no clubbing Pulses: 2+ symmetric, upper and lower extremities, normal cap refill Neurological: alert, oriented x 3, CN2-12 intact, strength normal upper extremities and lower extremities, sensation normal throughout, DTRs 2+ throughout, no cerebellar signs, gait normal Psychiatric: normal affect, behavior normal, pleasant  GU: normal male external genitalia, circumcised, dorsal distal shaft and distal dorsolateral shaft with approx 3 separate raised verruca 3-25mm x 2 mm lesions suggestive of warts, non tender, no masses, no hernia, no lymphadenopathy Rectal: Left perianal region with large patches verrucal growth measuring at least 4 cm x 2 cm base similar smaller vertical lesions on the right buttock perianal region    Assessment and Plan :   Encounter Diagnoses  Name Primary?  . Encounter for health maintenance examination in adult Yes  . Essential hypertension   . Chronic low back pain with left-sided  sciatica, unspecified back pain laterality   . Hyperlipidemia, unspecified hyperlipidemia type   . Smoker   . Hypokalemia   . History of stroke   . Erectile dysfunction, unspecified erectile dysfunction type   . Vaccine counseling   . Screen for colon cancer   . Screening for prostate cancer   . PVD (peripheral vascular disease) ([facility])   . Low libido     Physical exam - discussed and counseled on healthy lifestyle, diet, exercise, preventative care, vaccinations, sick and well care, proper use of emergency dept and after hours care, and addressed their concerns.    Health screening: See your eye doctor yearly for routine vision care. See your dentist yearly for routine dental care including hygiene visits twice yearly.  Cancer screening Colonoscopy:  Referred for screening cologuard  Discussed PSA, prostate exam, and prostate cancer screening risks/benefits.    Discussed CT chest lung cancer screening.  He will consider    Vaccinations: Advised yearly influenza vaccine Counseled on Shingles vaccine at age 20 years and older Patient will check insurance coverage for this and consider vaccination  He is up-to-date on Covid vaccine, Td and pneumonia vaccine   Separate significant chronic issues discussed: Hyperlipidemia -restart medication, return for fasting labs next week as planned  HTN -not quite to goal, change metoprolol to Toprol-XL today, increased amlodipine to the highest dose, continue lisinopril HCT, return for fasting labs next week.  I advise he go ahead and do a follow-up with Dr. 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  Diagnoses and all orders for this  visit:  Encounter for health maintenance examination in adult -     Comprehensive metabolic panel; Future -     CBC with Differential/Platelet; Future -     PSA; Future -     Lipid panel; Future -     Testosterone; Future  Essential hypertension -     Comprehensive metabolic panel; Future  Chronic low back pain with left-sided sciatica, unspecified back pain laterality  Hyperlipidemia, unspecified hyperlipidemia type  Smoker  Hypokalemia  History of stroke  Erectile dysfunction, unspecified erectile dysfunction type -     Testosterone; Future  Vaccine counseling  Screen for colon cancer  Screening for prostate cancer -  PSA; Future  PVD (peripheral vascular disease) ([facility]) -     PSA; Future  Low libido -     Testosterone; Future  Other orders -     metoprolol succinate (TOPROL XL) 100 MG 24 hr tablet; Take 1 tablet (100 mg total) by mouth daily. Take with or immediately following a meal. -     rosuvastatin (CRESTOR) 20 MG tablet; Take 1 tablet (20 mg total) by mouth daily. -     amLODipine (NORVASC) 10 MG tablet; Take 1 tablet (10 mg total) by mouth daily. -     aspirin EC 325 MG tablet; Take 1 tablet (325 mg total) by mouth daily.    Follow-up pending labs, yearly for physical

## 2019-12-26 NOTE — Patient Instructions (Addendum)
Recommendations:  High blood pressure  Continue Lisinopril HCT blood pressure pill daily in the morning  Finish out the Amlodipine 5mg  tablet daily in the morning, and go to the 10mg  dose when you get the refills  I changed Metoprolol from twice daily to once daily Toprol XL 100mg , 1 tablet in the morning daily  High Cholesterol  Begin Crestor 20mg  daily at bedtime along with Aspirin 325mg  1 tablet daily at bedtime   Warts I will check with general surgery and urology about procedure    Screen for cancer  We will check PSA lab when you return for labs on Monday fasting  We will again refer for [facility] stool test  If you have not heard back from [facility] within 10-14 days, call us back   See your eye doctor yearly for routine vision care.  See your dentist yearly for routine dental care including hygiene visits twice yearly.   I recommend a yearly follow with Dr. Faythe Dingwall   Shingles vaccine:  I recommend you have a shingles vaccine to help prevent shingles or herpes zoster outbreak.   Please call your insurer to inquire about coverage for the Shingrix vaccine given in 2 doses.   Some insurers cover this vaccine after age 58, some cover this after age 45.  If your insurer covers this, then call to schedule appointment to have this vaccine here.  I recommend a yearly flu shot

## 2019-12-26 NOTE — Addendum Note (Signed)
Addended by: Minette Headland A on: 12/26/2019 04:18 PM   Modules accepted: Orders

## 2019-12-31 ENCOUNTER — Other Ambulatory Visit: Payer: BC Managed Care – PPO

## 2019-12-31 ENCOUNTER — Other Ambulatory Visit: Payer: Self-pay

## 2019-12-31 DIAGNOSIS — I10 Essential (primary) hypertension: Secondary | ICD-10-CM | POA: Diagnosis not present

## 2019-12-31 DIAGNOSIS — Z125 Encounter for screening for malignant neoplasm of prostate: Secondary | ICD-10-CM | POA: Diagnosis not present

## 2019-12-31 DIAGNOSIS — I739 Peripheral vascular disease, unspecified: Secondary | ICD-10-CM

## 2019-12-31 DIAGNOSIS — N529 Male erectile dysfunction, unspecified: Secondary | ICD-10-CM | POA: Diagnosis not present

## 2019-12-31 DIAGNOSIS — R6882 Decreased libido: Secondary | ICD-10-CM | POA: Diagnosis not present

## 2019-12-31 DIAGNOSIS — Z0000 Encounter for general adult medical examination without abnormal findings: Secondary | ICD-10-CM | POA: Diagnosis not present

## 2020-01-01 ENCOUNTER — Other Ambulatory Visit: Payer: Self-pay | Admitting: Medical

## 2020-01-01 LAB — CBC WITH DIFFERENTIAL/PLATELET
Basophils Absolute: 0.1 10*3/uL (ref 0.0–0.2)
Basos: 1 %
EOS (ABSOLUTE): 0.2 10*3/uL (ref 0.0–0.4)
Eos: 2 %
Hematocrit: 41.6 % (ref 37.5–51.0)
Hemoglobin: 13.5 g/dL (ref 13.0–17.7)
Immature Grans (Abs): 0 10*3/uL (ref 0.0–0.1)
Immature Granulocytes: 0 %
Lymphocytes Absolute: 1.6 10*3/uL (ref 0.7–3.1)
Lymphs: 21 %
MCH: 31.4 pg (ref 26.6–33.0)
MCHC: 32.5 g/dL (ref 31.5–35.7)
MCV: 97 fL (ref 79–97)
Monocytes Absolute: 0.5 10*3/uL (ref 0.1–0.9)
Monocytes: 6 %
Neutrophils Absolute: 5.3 10*3/uL (ref 1.4–7.0)
Neutrophils: 70 %
Platelets: 134 10*3/uL — ABNORMAL LOW (ref 150–450)
RBC: 4.3 x10E6/uL (ref 4.14–5.80)
RDW: 12.6 % (ref 11.6–15.4)
WBC: 7.6 10*3/uL (ref 3.4–10.8)

## 2020-01-01 LAB — COMPREHENSIVE METABOLIC PANEL
ALT: 10 IU/L (ref 0–44)
AST: 13 IU/L (ref 0–40)
Albumin/Globulin Ratio: 1.9 (ref 1.2–2.2)
Albumin: 4.4 g/dL (ref 3.8–4.9)
Alkaline Phosphatase: 90 IU/L (ref 48–121)
BUN/Creatinine Ratio: 13 (ref 9–20)
BUN: 14 mg/dL (ref 6–24)
Bilirubin Total: 0.2 mg/dL (ref 0.0–1.2)
CO2: 23 mmol/L (ref 20–29)
Calcium: 9.8 mg/dL (ref 8.7–10.2)
Chloride: 103 mmol/L (ref 96–106)
Creatinine, Ser: 1.11 mg/dL (ref 0.76–1.27)
GFR calc Af Amer: 87 mL/min/{1.73_m2} (ref >59)
GFR calc non Af Amer: 75 mL/min/{1.73_m2} (ref >59)
Globulin, Total: 2.3 g/dL (ref 1.5–4.5)
Glucose: 98 mg/dL (ref 65–99)
Potassium: 4.1 mmol/L (ref 3.5–5.2)
Sodium: 142 mmol/L (ref 134–144)
Total Protein: 6.7 g/dL (ref 6.0–8.5)

## 2020-01-01 LAB — LIPID PANEL
Chol/HDL Ratio: 6 ratio — ABNORMAL HIGH (ref 0.0–5.0)
Cholesterol, Total: 247 mg/dL — ABNORMAL HIGH (ref 100–199)
HDL: 41 mg/dL (ref >39)
LDL Chol Calc (NIH): 191 mg/dL — ABNORMAL HIGH (ref 0–99)
Triglycerides: 86 mg/dL (ref 0–149)
VLDL Cholesterol Cal: 15 mg/dL (ref 5–40)

## 2020-01-01 LAB — PSA: Prostate Specific Ag, Serum: 0.6 ng/mL (ref 0.0–4.0)

## 2020-01-01 LAB — TESTOSTERONE: Testosterone: 468 ng/dL (ref 264–916)

## 2020-03-03 ENCOUNTER — Other Ambulatory Visit: Payer: Self-pay

## 2020-03-03 ENCOUNTER — Telehealth: Payer: Self-pay | Admitting: Medical

## 2020-03-03 DIAGNOSIS — A630 Anogenital (venereal) warts: Secondary | ICD-10-CM

## 2020-03-03 NOTE — Telephone Encounter (Signed)
Referral has been place.

## 2020-03-03 NOTE — Telephone Encounter (Signed)
Please make sure we referred to general surgery for warts, significant genital and buttock region.  I can't tell where he has seen them yet

## 2020-03-13 ENCOUNTER — Ambulatory Visit: Payer: BC Managed Care – PPO | Admitting: Surgery

## 2020-03-18 ENCOUNTER — Ambulatory Visit: Payer: Self-pay | Admitting: Surgery

## 2020-03-18 ENCOUNTER — Other Ambulatory Visit: Payer: Self-pay

## 2020-03-18 ENCOUNTER — Telehealth: Payer: Self-pay | Admitting: Surgery

## 2020-03-18 ENCOUNTER — Encounter: Payer: Self-pay | Admitting: Surgery

## 2020-03-18 ENCOUNTER — Ambulatory Visit (INDEPENDENT_AMBULATORY_CARE_PROVIDER_SITE_OTHER): Payer: BC Managed Care – PPO | Admitting: Surgery

## 2020-03-18 VITALS — BP 169/105 | HR 74 | Temp 98.2°F | Resp 12 | Ht 68.5 in | Wt 184.2 lb

## 2020-03-18 DIAGNOSIS — A630 Anogenital (venereal) warts: Secondary | ICD-10-CM

## 2020-03-18 NOTE — Telephone Encounter (Signed)
Pt has been advised of Pre-Admission date/time, COVID Testing date and Surgery date.  Surgery Date: 03/21/20 Preadmission Testing Date: 03/19/20 (phone 8a-1p) Covid Testing Date: 03/20/20 - patient advised to go to the [facility] ([facility] between 8a-1p   Patient has been made aware to call [phone], between 1-3:00pm the day before surgery, to find out what time to arrive for surgery.

## 2020-03-18 NOTE — Patient Instructions (Addendum)
Our surgery scheduler will contact you to schedule your surgery. Please have the blue sheet available when she calls you.   Stop taking your Aspirin starting today.   Genital Warts Genital warts are a common STD (sexually transmitted disease). They may appear as small bumps on the skin of the genital and anal areas. They sometimes become irritated and cause pain. Genital warts are easily passed to other people through sexual contact. Many people do not know that they are infected. They may be infected for years without symptoms. However, even if they do not have symptoms, they can pass the infection to their sexual partners. Getting treatment is important because genital warts can lead to other problems. In females, the virus that causes genital warts may increase the risk for cervical cancer. What are the causes? This condition is caused by a virus that is called human papillomavirus (HPV). HPV is spread by having unprotected sex with an infected person. It can be spread through vaginal, anal, and oral sex. What increases the risk? You are more likely to develop this condition if:  You have unprotected sex.  You have multiple sexual partners.  You are sexually active before age 25.  You are a man who isnot circumcised.  You have a male sexual partner who is not circumcised.  You have a weakened body defense system (immune system) due to disease or medicine.  You smoke. What are the signs or symptoms? Symptoms of this condition include:  Small growths in the genital area or anal area. These warts often grow in clusters.  Itching and irritation in the genital area or anal area.  Bleeding from the warts.  Pain during sex. How is this diagnosed? This condition is diagnosed based on:  Your symptoms.  A physical exam. You may also have other tests, including:  Biopsy. A tissue sample is removed so it can be checked under a microscope.  Colposcopy. In females, a magnifying  tool is used to examine the vagina and cervix. Certain solutions may be used to make the HPV cells change color so they can be seen more easily.  A Pap test in females.  Tests for other STDs. How is this treated? This condition may be treated with:  Medicines, such as: ? Solutions or creams applied to your skin (topical).  Procedures, such as: ? Freezing the warts with liquid nitrogen (cryotherapy). ? Burning the warts with a laser or electric probe (electrocautery). ? Surgery to remove the warts. Follow these instructions at home: Medicines   Apply over-the-counter and prescription medicines only as told by your health care provider.  Do not treat genital warts with medicines that are used for treating hand warts.  Talk with your health care provider about using over-the-counter anti-itch creams. Instructions for women  Get screened regularly for cervical cancer. Women who have genital warts are at an increased risk for this cancer. This type of cancer is slow growing and can be cured if it is found early.  If you become pregnant, tell your health care provider that you have had an HPV infection. Your health care provider will monitor you closely during pregnancy to be sure that your baby is safe. General instructions  Do not touch or scratch the warts.  Do not have sex until your treatment has been completed.  Tell your current and past sexual partners about your condition because they may also need treatment.  After treatment, use condoms during sex to prevent future infections.  Keep all  follow-up visits as told by your health care provider. This is important. How is this prevented? Talk with your health care provider about getting the HPV vaccine. The vaccine:  Can prevent some HPV infections and cancers.  Is recommended for males and females who are 41-89 years old.  Is not recommended for pregnant women.  Will not work if you already have HPV. Contact a health  care provider if you:  Have redness, swelling, or pain in the area of the treated skin.  Have a fever.  Feel generally ill.  Feel lumps in and around your genital or anal area.  Have bleeding in your genital or anal area.  Have pain during sex. Summary  Genital warts are a common STD (sexually transmitted disease). It may appear as small bumps on the genital and anal areas.  This condition is caused by a virus that is called human papillomavirus (HPV). HPV is spread by having unprotected sex with an infected person. It can be spread through vaginal, anal, and oral sex.  Treatment is important because genital warts can lead to other problems. In females, the virus that causes genital warts may increase the risk for cervical cancer.  This condition may be treated with medicine that is applied to the skin, or procedures to remove the warts.  The HPV vaccine can prevent some HPV infections and cancers. It is recommended that the vaccine be given to males and females who are 43-8 years old. This information is not intended to replace advice given to you by your health care provider. Make sure you discuss any questions you have with your health care provider. Document Revised: 08/09/2017 Document Reviewed: 08/09/2017 Elsevier Patient Education  2020 [employer].

## 2020-03-18 NOTE — H&P (View-Only) (Signed)
Patient ID: Shane Valentine, male   DOB: Aug 21, 1965, 54 y.o.   MRN: [id number]  Chief Complaint: Anal warts  History of Present Illness Shane Valentine is a 53 y.o. 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  Reports pain is on and off, frequently associated with wiping after bowel movements.  He reports his bowel movements are daily, but often goes a day without having one movement.  He denies any bleeding.  Reports having a good appetite, denies fevers, chills, nausea, vomiting.  He does take 325 mg aspirin daily. He reports he has had prior topical treatments, including cryo treatment which appeared to be ineffective.  He has been referred to me because another surgeon reportedly stated this was too extensive.  Past Medical History Past Medical History:  Diagnosis Date  . GERD (gastroesophageal reflux disease)   . Headache   . Hyperlipidemia   . Hypertension   . Paresthesia of hand, bilateral   . Smoker   . Stroke ([place])   . Tuberculosis 1995   38mo therapy, [place]      Past Surgical History:  Procedure Laterality Date  . INGUINAL HERNIA REPAIR Left 03/29/2014   Procedure: LAPAROSCOPIC LEFT INGUINAL HERNIA REPAIR;  Surgeon: Ralene Ok, MD;  Location: [place];  Service: General;  Laterality: Left;  . INSERTION OF MESH Left 03/29/2014   Procedure: INSERTION OF MESH;  Surgeon: Ralene Ok, MD;  Location: [place];  Service: General;  Laterality: Left;    No Known Allergies  Current Outpatient Medications  Medication Sig Dispense Refill  . amLODipine (NORVASC) 10 MG tablet Take 1 tablet (10 mg total) by mouth daily. 90 tablet 3  . aspirin EC 325 MG tablet Take 1 tablet (325 mg total) by mouth daily. 90 tablet 3  . lisinopril-hydrochlorothiazide (ZESTORETIC) 20-25 MG tablet Take 1 tablet by mouth once daily 90 tablet 0  . metoprolol succinate (TOPROL XL) 100 MG 24 hr tablet Take 1 tablet (100 mg total) by mouth daily. Take with or immediately following a meal. 90 tablet 3   . rosuvastatin (CRESTOR) 20 MG tablet Take 1 tablet (20 mg total) by mouth daily. 90 tablet 3   No current facility-administered medications for this visit.    Family History Family History  Problem Relation Age of Onset  . Diabetes Mother        amputations  . Hypertension Mother   . Cancer Father   . COPD Brother   . Heart disease Neg Hx   . Stroke Neg Hx       Social History Social History   Tobacco Use  . Smoking status: Current Every Day Smoker    Packs/day: 0.50    Years: 23.00    Pack years: 11.50    Types: Cigarettes  . Smokeless tobacco: Never Used  Substance Use Topics  . Alcohol use: No  . Drug use: Yes    Types: Marijuana    Comment: 03/28/14  --last time 03/27/14        Review of Systems  Constitutional: Negative.   HENT: Negative.   Eyes: Negative.   Respiratory: Negative.   Cardiovascular: Negative.   Gastrointestinal: Negative.   Genitourinary: Negative.   Skin: Positive for itching and rash.  Neurological: Negative.   Psychiatric/Behavioral: Negative.       Physical Exam Blood pressure (!) 169/105, pulse 74, temperature 98.2 F (36.8 C), resp. rate 12, height 5' 8.5" (1.74 m), weight 184 lb 3.2 oz (83.6 kg), SpO2 97 %. Last  Weight  Most recent update: 03/18/2020 10:23 AM   Weight  83.6 kg (184 lb 3.2 oz)            CONSTITUTIONAL: Well developed, and nourished, appropriately responsive and aware without distress.   EYES: Sclera non-icteric.   EARS, NOSE, MOUTH AND THROAT: Mask worn.   The oropharynx is clear. Oral mucosa is pink and moist.  Dentition: In need of further attention, anticipating seeing a dentist in the next week.   Hearing is intact to voice.  NECK: Trachea is midline, and there is no jugular venous distension.  LYMPH NODES:  Lymph nodes in the neck are not enlarged. RESPIRATORY:  Lungs are clear, and breath sounds are equal bilaterally. Normal respiratory effort without pathologic use of accessory  muscles. CARDIOVASCULAR: Heart is regular in rate and rhythm. GI: The abdomen is soft, nontender, and nondistended. There were no palpable masses. I did not appreciate hepatosplenomegaly. There were normal bowel sounds. GU: There is a confluent patch, probably over 3 cm in diameter, of anal condyloma on the anterior left, not immediately adjacent to the anal sphincteric mechanism, actually more on the buttock.  There was much less disease noted on the patient's right a scattering of confluences no more than a centimeter in size roughly half a dozen, this is also anterior, and not immediately adjacent to the anus.  There appears to be no appreciable induration or inflammatory changes.  There is no bleeding.  Limited exam of the anal canal did not appreciate any anal involvement. MUSCULOSKELETAL:  Symmetrical muscle tone appreciated in all four extremities.    SKIN: Skin turgor is normal. No pathologic skin lesions appreciated.  NEUROLOGIC:  Motor and sensation appear grossly normal.  Cranial nerves are grossly without defect. PSYCH:  Alert and oriented to person, place and time. Affect is appropriate for situation.  Data Reviewed I have personally reviewed what is currently available of the patient's imaging, recent labs and medical records.   Labs:  CBC Latest Ref Rng & Units 12/31/2019 12/18/2018 08/29/2017  WBC 3.4 - 10.8 x10E3/uL 7.6 7.8 8.3  Hemoglobin 13.0 - 17.7 g/dL 13.5 14.0 13.0  Hematocrit 37.5 - 51.0 % 41.6 41.7 40.7  Platelets 150 - 450 x10E3/uL 134(L) 150 212   CMP Latest Ref Rng & Units 12/31/2019 12/18/2018 08/29/2017  Glucose 65 - 99 mg/dL 98 95 85  BUN 6 - 24 mg/dL 14 16 19   Creatinine 0.76 - 1.27 mg/dL 1.11 1.03 1.26  Sodium 134 - 144 mmol/L 142 143 145(H)  Potassium 3.5 - 5.2 mmol/L 4.1 4.2 3.7  Chloride 96 - 106 mmol/L 103 105 102  CO2 20 - 29 mmol/L 23 25 26   Calcium 8.7 - 10.2 mg/dL 9.8 9.6 9.2  Total Protein 6.0 - 8.5 g/dL 6.7 6.6 6.7  Total Bilirubin 0.0 - 1.2 mg/dL 0.2  0.3 0.2  Alkaline Phos 48 - 121 IU/L 90 74 80  AST 0 - 40 IU/L 13 13 15   ALT 0 - 44 IU/L 10 13 9       Imaging:  Within last 24 hrs: No results found.  Assessment    Anal condyloma. Patient Active Problem List   Diagnosis Date Noted  . Screen for colon cancer 12/26/2019  . PVD (peripheral vascular disease) ([place]) 12/26/2019  . Screening for prostate cancer 12/26/2019  . Low libido 12/26/2019  . Right shoulder pain 12/18/2018  . Need for pneumococcal vaccination 11/30/2017  . Vaccine counseling 11/30/2017  . Encounter for health maintenance examination in  adult 11/30/2017  . Dental caries 11/30/2017  . Dental abscess 11/30/2017  . Radicular syndrome of left leg 11/30/2017  . Chronic low back pain with left-sided sciatica 11/30/2017  . Genital warts 11/30/2017  . Anal wart 11/30/2017  . Left leg weakness 08/29/2017  . Paresthesia 08/29/2017  . History of stroke 04/19/2017  . Financial difficulties 04/19/2017  . Erectile dysfunction 04/19/2017  . Hypokalemia 03/21/2015  . Bilateral hand numbness 03/21/2015  . Hyperlipidemia 01/07/2015  . Smoker 01/07/2015  . Essential hypertension 01/07/2015    Plan    We discussed various options of treatment, he would like to proceed with excision, which I do not believe is unreasonable considering the limited region in which we are attempting to manage.  He understands the presence of an open wound, the long treatment of the open wounds, the risk of recurrence.  We have discussed other risks including anesthesia, bleeding, infection and once again recurrence and utilization of medical management, once his disease load is diminished.  He desires to proceed urgently this week if possible.  Further discussion had, questions answered to his satisfaction.  No guarantees were ever expressed or implied.  He will hold his aspirin in the interim. Face-to-face time spent with the patient and accompanying care providers(if present) was 30 minutes,  with more than 50% of the time spent counseling, educating, and coordinating care of the patient.      Shane Valentine M.D., FACS 03/18/2020, 11:24 AM

## 2020-03-18 NOTE — Progress Notes (Signed)
Patient ID: Shane Valentine, male   DOB: June 24, 1966, 54 y.o.   MRN: [id number]  Chief Complaint: Anal warts  History of Present Illness Shane Valentine is a 54 y.o. 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  Reports pain is on and off, frequently associated with wiping after bowel movements.  He reports his bowel movements are daily, but often goes a day without having one movement.  He denies any bleeding.  Reports having a good appetite, denies fevers, chills, nausea, vomiting.  He does take 325 mg aspirin daily. He reports he has had prior topical treatments, including cryo treatment which appeared to be ineffective.  He has been referred to me because another surgeon reportedly stated this was too extensive.  Past Medical History Past Medical History:  Diagnosis Date   GERD (gastroesophageal reflux disease)    Headache    Hyperlipidemia    Hypertension    Paresthesia of hand, bilateral    Smoker    Stroke [facility])    Tuberculosis 1995   58mo therapy, [place]      Past Surgical History:  Procedure Laterality Date   INGUINAL HERNIA REPAIR Left 03/29/2014   Procedure: LAPAROSCOPIC LEFT INGUINAL HERNIA REPAIR;  Surgeon: Ralene Ok, MD;  Location: [place];  Service: General;  Laterality: Left;   INSERTION OF MESH Left 03/29/2014   Procedure: INSERTION OF MESH;  Surgeon: Ralene Ok, MD;  Location: [place];  Service: General;  Laterality: Left;    No Known Allergies  Current Outpatient Medications  Medication Sig Dispense Refill   amLODipine (NORVASC) 10 MG tablet Take 1 tablet (10 mg total) by mouth daily. 90 tablet 3   aspirin EC 325 MG tablet Take 1 tablet (325 mg total) by mouth daily. 90 tablet 3   lisinopril-hydrochlorothiazide (ZESTORETIC) 20-25 MG tablet Take 1 tablet by mouth once daily 90 tablet 0   metoprolol succinate (TOPROL XL) 100 MG 24 hr tablet Take 1 tablet (100 mg total) by mouth daily. Take with or immediately following a meal. 90 tablet 3    rosuvastatin (CRESTOR) 20 MG tablet Take 1 tablet (20 mg total) by mouth daily. 90 tablet 3   No current facility-administered medications for this visit.    Family History Family History  Problem Relation Age of Onset   Diabetes Mother        amputations   Hypertension Mother    Cancer Father    COPD Brother    Heart disease Neg Hx    Stroke Neg Hx       Social History Social History   Tobacco Use   Smoking status: Current Every Day Smoker    Packs/day: 0.50    Years: 23.00    Pack years: 11.50    Types: Cigarettes   Smokeless tobacco: Never Used  Substance Use Topics   Alcohol use: No   Drug use: Yes    Types: Marijuana    Comment: 03/28/14  --last time 03/27/14        Review of Systems  Constitutional: Negative.   HENT: Negative.   Eyes: Negative.   Respiratory: Negative.   Cardiovascular: Negative.   Gastrointestinal: Negative.   Genitourinary: Negative.   Skin: Positive for itching and rash.  Neurological: Negative.   Psychiatric/Behavioral: Negative.       Physical Exam Blood pressure (!) 169/105, pulse 74, temperature 98.2 F (36.8 C), resp. rate 12, height 5' 8.5" (1.74 m), weight 184 lb 3.2 oz (83.6 kg), SpO2 97 %. Last  Weight  Most recent update: 03/18/2020 10:23 AM   Weight  83.6 kg (184 lb 3.2 oz)            CONSTITUTIONAL: Well developed, and nourished, appropriately responsive and aware without distress.   EYES: Sclera non-icteric.   EARS, NOSE, MOUTH AND THROAT: Mask worn.   The oropharynx is clear. Oral mucosa is pink and moist.  Dentition: In need of further attention, anticipating seeing a dentist in the next week.   Hearing is intact to voice.  NECK: Trachea is midline, and there is no jugular venous distension.  LYMPH NODES:  Lymph nodes in the neck are not enlarged. RESPIRATORY:  Lungs are clear, and breath sounds are equal bilaterally. Normal respiratory effort without pathologic use of accessory  muscles. CARDIOVASCULAR: Heart is regular in rate and rhythm. GI: The abdomen is soft, nontender, and nondistended. There were no palpable masses. I did not appreciate hepatosplenomegaly. There were normal bowel sounds. GU: There is a confluent patch, probably over 3 cm in diameter, of anal condyloma on the anterior left, not immediately adjacent to the anal sphincteric mechanism, actually more on the buttock.  There was much less disease noted on the patient's right a scattering of confluences no more than a centimeter in size roughly half a dozen, this is also anterior, and not immediately adjacent to the anus.  There appears to be no appreciable induration or inflammatory changes.  There is no bleeding.  Limited exam of the anal canal did not appreciate any anal involvement. MUSCULOSKELETAL:  Symmetrical muscle tone appreciated in all four extremities.    SKIN: Skin turgor is normal. No pathologic skin lesions appreciated.  NEUROLOGIC:  Motor and sensation appear grossly normal.  Cranial nerves are grossly without defect. PSYCH:  Alert and oriented to person, place and time. Affect is appropriate for situation.  Data Reviewed I have personally reviewed what is currently available of the patient's imaging, recent labs and medical records.   Labs:  CBC Latest Ref Rng & Units 12/31/2019 12/18/2018 08/29/2017  WBC 3.4 - 10.8 x10E3/uL 7.6 7.8 8.3  Hemoglobin 13.0 - 17.7 g/dL 13.5 14.0 13.0  Hematocrit 37.5 - 51.0 % 41.6 41.7 40.7  Platelets 150 - 450 x10E3/uL 134(L) 150 212   CMP Latest Ref Rng & Units 12/31/2019 12/18/2018 08/29/2017  Glucose 65 - 99 mg/dL 98 95 85  BUN 6 - 24 mg/dL 14 16 19   Creatinine 0.76 - 1.27 mg/dL 1.11 1.03 1.26  Sodium 134 - 144 mmol/L 142 143 145(H)  Potassium 3.5 - 5.2 mmol/L 4.1 4.2 3.7  Chloride 96 - 106 mmol/L 103 105 102  CO2 20 - 29 mmol/L 23 25 26   Calcium 8.7 - 10.2 mg/dL 9.8 9.6 9.2  Total Protein 6.0 - 8.5 g/dL 6.7 6.6 6.7  Total Bilirubin 0.0 - 1.2 mg/dL 0.2  0.3 0.2  Alkaline Phos 48 - 121 IU/L 90 74 80  AST 0 - 40 IU/L 13 13 15   ALT 0 - 44 IU/L 10 13 9       Imaging:  Within last 24 hrs: No results found.  Assessment    Anal condyloma. Patient Active Problem List   Diagnosis Date Noted   Screen for colon cancer 12/26/2019   PVD (peripheral vascular disease) ([place]) 12/26/2019   Screening for prostate cancer 12/26/2019   Low libido 12/26/2019   Right shoulder pain 12/18/2018   Need for pneumococcal vaccination 11/30/2017   Vaccine counseling 11/30/2017   Encounter for health maintenance examination in  adult 11/30/2017   Dental caries 11/30/2017   Dental abscess 11/30/2017   Radicular syndrome of left leg 11/30/2017   Chronic low back pain with left-sided sciatica 11/30/2017   Genital warts 11/30/2017   Anal wart 11/30/2017   Left leg weakness 08/29/2017   Paresthesia 08/29/2017   History of stroke 04/19/2017   Financial difficulties 04/19/2017   Erectile dysfunction 04/19/2017   Hypokalemia 03/21/2015   Bilateral hand numbness 03/21/2015   Hyperlipidemia 01/07/2015   Smoker 01/07/2015   Essential hypertension 01/07/2015    Plan    We discussed various options of treatment, he would like to proceed with excision, which I do not believe is unreasonable considering the limited region in which we are attempting to manage.  He understands the presence of an open wound, the long treatment of the open wounds, the risk of recurrence.  We have discussed other risks including anesthesia, bleeding, infection and once again recurrence and utilization of medical management, once his disease load is diminished.  He desires to proceed urgently this week if possible.  Further discussion had, questions answered to his satisfaction.  No guarantees were ever expressed or implied.  He will hold his aspirin in the interim. Face-to-face time spent with the patient and accompanying care providers(if present) was 30 minutes,  with more than 50% of the time spent counseling, educating, and coordinating care of the patient.      Ronny Bacon M.D., FACS 03/18/2020, 11:24 AM

## 2020-03-19 ENCOUNTER — Telehealth: Payer: Self-pay | Admitting: Surgery

## 2020-03-19 ENCOUNTER — Encounter
Admission: RE | Admit: 2020-03-19 | Discharge: 2020-03-19 | Disposition: A | Discharge status: Home / Self Care | Payer: BC Managed Care – PPO | Source: Ambulatory Visit | Attending: Surgery | Admitting: Surgery

## 2020-03-19 HISTORY — DX: Anogenital (venereal) warts: A63.0

## 2020-03-19 HISTORY — DX: Peripheral vascular disease, unspecified: I73.9

## 2020-03-19 NOTE — Patient Instructions (Signed)
Your procedure is scheduled on: March 21, 2020 FRIDAY Report to Day Surgery on the 2nd floor of the [facility]. To find out your arrival time, please call [phone] between 1PM - 3PM on: September 2,, 2021 THURSDAY  REMEMBER: Instructions that are not followed completely may result in serious medical risk, up to and including death; or upon the discretion of your surgeon and anesthesiologist your surgery may need to be rescheduled.  Do not eat food after midnight the night before surgery.  No gum chewing, lozengers or hard candies.  You may however, drink CLEAR liquids up to 2 hours before you are scheduled to arrive for your surgery. Do not drink anything within 2 hours of your scheduled arrival time.  Clear liquids include: - water  - apple juice without pulp - gatorade (not RED) - black coffee or tea (Do NOT add milk or creamers to the coffee or tea) Do NOT drink anything that is not on this list.  Type 1 and Type 2 diabetics should only drink water.    IF YOU TAKE THESE MEDICATIONS IN THE MORNING YOU MAY TAKE THEM THE  MORNING OF SURGERY WITH A SIP OF WATER: AMLODIPINE METOPROLOL RESUVASTATIN  DO NOT TAKE LISINOPRIL-HYDROCHLOROTHIAZIDE MORNING OF SURGERY  STOP ASPIRIN  One week prior to surgery: Stop Anti-inflammatories (NSAIDS) such as Advil, Aleve, Ibuprofen, Motrin, Naproxen, Naprosyn and Aspirin based products such as Excedrin, Goodys Powder, BC Powder. Stop ANY OVER THE COUNTER supplements until after surgery. (You may continue taking Tylenol, Vitamin D, Vitamin B, and multivitamin.)  No Alcohol for 24 hours before or after surgery.  No Smoking including e-cigarettes for 24 hours prior to surgery.  No chewable tobacco products for at least 6 hours prior to surgery.  No nicotine patches on the day of surgery.  Do not use any "recreational" drugs for at least a week prior to your surgery.  Please be advised that the combination of cocaine and anesthesia  may have negative outcomes, up to and including death. If you test positive for cocaine, your surgery will be cancelled.  On the morning of surgery brush your teeth with toothpaste and water, you may rinse your mouth with mouthwash if you wish. Do not swallow any toothpaste or mouthwash.  Do not wear jewelry, make-up, hairpins, clips or nail polish.  Do not wear lotions, powders, or perfumes.   Do not shave 48 hours prior to surgery.   Contact lenses, hearing aids and dentures may not be worn into surgery.  Do not bring valuables to the hospital. [facility] is not responsible for any missing/lost belongings or valuables.   SHOWER MORNING OF SURGERY  Notify your doctor if there is any change in your medical condition (cold, fever, infection).  Wear comfortable clothing (specific to your surgery type) to the hospital.  Plan for stool softeners for home use; pain medications have a tendency to cause constipation. You can also help prevent constipation by eating foods high in fiber such as fruits and vegetables and drinking plenty of fluids as your diet allows.  After surgery, you can help prevent lung complications by doing breathing exercises.  Take deep breaths and cough every 1-2 hours. Your doctor may order a device called an Incentive Spirometer to help you take deep breaths. When coughing or sneezing, hold a pillow firmly against your incision with both hands. This is called "splinting." Doing this helps protect your incision. It also decreases belly discomfort.  If you are being discharged the day  of surgery, you will not be allowed to drive home. You will need a responsible adult (18 years or older) to drive you home and stay with you that night.   Please call the [place] Dept. at [phone] if you have any questions about these instructions.  Visitation Policy:  Patients undergoing a surgery or procedure may have one family member or support person with  them as long as that person is not COVID-19 positive or experiencing its symptoms.  That person may remain in the waiting area during the procedure.  Inpatient Visitation Update:   In an effort to ensure the safety of our team members and our patients, we are implementing a change to our visitation policy:  Effective Monday, Aug. 9, at 7 a.m., inpatients will be allowed one support person.  o The support person may change daily.  o The support person must pass our screening, gel in and out, and wear a mask at all times, including in the patient's room.  o Patients must also wear a mask when staff or their support person are in the room.  o Masking is required regardless of vaccination status.  Systemwide, no visitors 17 or younger.

## 2020-03-19 NOTE — Telephone Encounter (Signed)
Ann with Pre-admissions calls [phone]), states she has been trying to get in touch with the patient.  The patient in need of pre-op testing and Covid testing.  Patient not answering his phone, nor returning calls.  When I had initially talked to the patient he voiced understanding the importance of making sure he takes the call from pre-admissions when they call.  I have also tried calling the patient several times, even left a detailed message that urgent patient calls pre-admissions back and left the number for him to call.  At 4:11 pm I called again, patient still not answering, left another message for him urgent to call us.

## 2020-03-20 ENCOUNTER — Other Ambulatory Visit: Payer: Self-pay

## 2020-03-20 ENCOUNTER — Encounter: Payer: Self-pay | Admitting: Surgery

## 2020-03-20 ENCOUNTER — Other Ambulatory Visit: Payer: Self-pay | Admitting: Medical

## 2020-03-20 ENCOUNTER — Other Ambulatory Visit
Admission: RE | Admit: 2020-03-20 | Discharge: 2020-03-20 | Disposition: A | Discharge status: Home / Self Care | Payer: BC Managed Care – PPO | Source: Ambulatory Visit | Attending: Surgery | Admitting: Surgery

## 2020-03-20 DIAGNOSIS — I10 Essential (primary) hypertension: Secondary | ICD-10-CM | POA: Diagnosis not present

## 2020-03-20 DIAGNOSIS — Z01818 Encounter for other preprocedural examination: Secondary | ICD-10-CM | POA: Diagnosis not present

## 2020-03-20 DIAGNOSIS — Z20822 Contact with and (suspected) exposure to covid-19: Secondary | ICD-10-CM | POA: Diagnosis not present

## 2020-03-20 LAB — CBC WITH DIFFERENTIAL/PLATELET
Abs Immature Granulocytes: 0.03 10*3/uL (ref 0.00–0.07)
Basophils Absolute: 0 10*3/uL (ref 0.0–0.1)
Basophils Relative: 1 %
Eosinophils Absolute: 0.1 10*3/uL (ref 0.0–0.5)
Eosinophils Relative: 1 %
HCT: 40.4 % (ref 39.0–52.0)
Hemoglobin: 13 g/dL (ref 13.0–17.0)
Immature Granulocytes: 0 %
Lymphocytes Relative: 15 %
Lymphs Abs: 1.3 10*3/uL (ref 0.7–4.0)
MCH: 31.2 pg (ref 26.0–34.0)
MCHC: 32.2 g/dL (ref 30.0–36.0)
MCV: 96.9 fL (ref 80.0–100.0)
Monocytes Absolute: 0.5 10*3/uL (ref 0.1–1.0)
Monocytes Relative: 6 %
Neutro Abs: 6.8 10*3/uL (ref 1.7–7.7)
Neutrophils Relative %: 77 %
Platelets: 204 10*3/uL (ref 150–400)
RBC: 4.17 MIL/uL — ABNORMAL LOW (ref 4.22–5.81)
RDW: 13.5 % (ref 11.5–15.5)
WBC: 8.8 10*3/uL (ref 4.0–10.5)
nRBC: 0 % (ref 0.0–0.2)

## 2020-03-20 LAB — COMPREHENSIVE METABOLIC PANEL
ALT: 15 U/L (ref 0–44)
AST: 18 U/L (ref 15–41)
Albumin: 4.2 g/dL (ref 3.5–5.0)
Alkaline Phosphatase: 69 U/L (ref 38–126)
Anion gap: 9 (ref 5–15)
BUN: 23 mg/dL — ABNORMAL HIGH (ref 6–20)
CO2: 26 mmol/L (ref 22–32)
Calcium: 9.2 mg/dL (ref 8.9–10.3)
Chloride: 105 mmol/L (ref 98–111)
Creatinine, Ser: 1.2 mg/dL (ref 0.61–1.24)
GFR calc Af Amer: >60 mL/min (ref >60)
GFR calc non Af Amer: >60 mL/min (ref >60)
Glucose, Bld: 102 mg/dL — ABNORMAL HIGH (ref 70–99)
Potassium: 4 mmol/L (ref 3.5–5.1)
Sodium: 140 mmol/L (ref 135–145)
Total Bilirubin: 0.9 mg/dL (ref 0.3–1.2)
Total Protein: 7 g/dL (ref 6.5–8.1)

## 2020-03-20 LAB — SARS CORONAVIRUS 2 (TAT 6-24 HRS): SARS Coronavirus 2: NEGATIVE

## 2020-03-21 ENCOUNTER — Encounter: Payer: Self-pay | Admitting: Surgery

## 2020-03-21 ENCOUNTER — Encounter
Admission: RE | Disposition: A | Discharge status: Home / Self Care | Payer: Self-pay | Source: Home / Self Care | Attending: Surgery

## 2020-03-21 ENCOUNTER — Other Ambulatory Visit: Payer: Self-pay

## 2020-03-21 ENCOUNTER — Ambulatory Visit: Payer: BC Managed Care – PPO | Admitting: Anesthesiology

## 2020-03-21 ENCOUNTER — Ambulatory Visit
Admission: RE | Admit: 2020-03-21 | Discharge: 2020-03-21 | Disposition: A | Discharge status: Home / Self Care | Payer: BC Managed Care – PPO | Attending: Surgery | Admitting: Surgery

## 2020-03-21 DIAGNOSIS — Z7982 Long term (current) use of aspirin: Secondary | ICD-10-CM | POA: Insufficient documentation

## 2020-03-21 DIAGNOSIS — E785 Hyperlipidemia, unspecified: Secondary | ICD-10-CM | POA: Insufficient documentation

## 2020-03-21 DIAGNOSIS — Z79899 Other long term (current) drug therapy: Secondary | ICD-10-CM | POA: Diagnosis not present

## 2020-03-21 DIAGNOSIS — I10 Essential (primary) hypertension: Secondary | ICD-10-CM | POA: Diagnosis not present

## 2020-03-21 DIAGNOSIS — A630 Anogenital (venereal) warts: Secondary | ICD-10-CM

## 2020-03-21 DIAGNOSIS — F17210 Nicotine dependence, cigarettes, uncomplicated: Secondary | ICD-10-CM | POA: Insufficient documentation

## 2020-03-21 DIAGNOSIS — K219 Gastro-esophageal reflux disease without esophagitis: Secondary | ICD-10-CM | POA: Diagnosis not present

## 2020-03-21 DIAGNOSIS — Z8673 Personal history of transient ischemic attack (TIA), and cerebral infarction without residual deficits: Secondary | ICD-10-CM | POA: Insufficient documentation

## 2020-03-21 HISTORY — DX: Unspecified osteoarthritis, unspecified site: M19.90

## 2020-03-21 HISTORY — PX: TUMOR EXCISION: SHX421

## 2020-03-21 LAB — URINE DRUG SCREEN, QUALITATIVE (ARMC ONLY)
Amphetamines, Ur Screen: NOT DETECTED
Barbiturates, Ur Screen: NOT DETECTED
Benzodiazepine, Ur Scrn: NOT DETECTED
Cannabinoid 50 Ng, Ur ~~LOC~~: POSITIVE — AB
Cocaine Metabolite,Ur ~~LOC~~: NOT DETECTED
MDMA (Ecstasy)Ur Screen: NOT DETECTED
Methadone Scn, Ur: NOT DETECTED
Opiate, Ur Screen: NOT DETECTED
Phencyclidine (PCP) Ur S: NOT DETECTED
Tricyclic, Ur Screen: NOT DETECTED

## 2020-03-21 SURGERY — EXCISION, NEOPLASM, RECTUM
Anesthesia: General | Laterality: Bilateral

## 2020-03-21 MED ORDER — FENTANYL CITRATE (PF) 100 MCG/2ML IJ SOLN: 25.0000 ug | INTRAMUSCULAR | Status: DC | PRN

## 2020-03-21 MED ORDER — DEXAMETHASONE SODIUM PHOSPHATE 10 MG/ML IJ SOLN
INTRAMUSCULAR | Status: DC | PRN
  Administered 2020-03-21: 10 mg via INTRAVENOUS

## 2020-03-21 MED ORDER — FAMOTIDINE 20 MG PO TABS
ORAL_TABLET | ORAL | Status: AC
  Administered 2020-03-21: 20 mg via ORAL
  Filled 2020-03-21: qty 1

## 2020-03-21 MED ORDER — ACETAMINOPHEN 500 MG PO TABS
ORAL_TABLET | ORAL | Status: AC
  Administered 2020-03-21: 1000 mg via ORAL
  Filled 2020-03-21: qty 2

## 2020-03-21 MED ORDER — BUPIVACAINE-EPINEPHRINE 0.25% -1:200000 IJ SOLN
INTRAMUSCULAR | Status: DC | PRN
  Administered 2020-03-21: 30 mL

## 2020-03-21 MED ORDER — ORAL CARE MOUTH RINSE: 15.0000 mL | Freq: Once | OROMUCOSAL | Status: AC

## 2020-03-21 MED ORDER — CHLORHEXIDINE GLUCONATE 0.12 % MT SOLN: 15.0000 mL | Freq: Once | OROMUCOSAL | Status: AC

## 2020-03-21 MED ORDER — CHLORHEXIDINE GLUCONATE CLOTH 2 % EX PADS
6.0000 | MEDICATED_PAD | Freq: Once | CUTANEOUS | Status: AC
  Administered 2020-03-21: 6 via TOPICAL

## 2020-03-21 MED ORDER — IBUPROFEN 800 MG PO TABS: 800.0000 mg | ORAL_TABLET | Freq: Three times a day (TID) | ORAL | 0 refills | Status: DC | PRN

## 2020-03-21 MED ORDER — FENTANYL CITRATE (PF) 100 MCG/2ML IJ SOLN
INTRAMUSCULAR | Status: AC
  Filled 2020-03-21: qty 2

## 2020-03-21 MED ORDER — CELECOXIB 200 MG PO CAPS: 200.0000 mg | ORAL_CAPSULE | ORAL | Status: AC

## 2020-03-21 MED ORDER — BUPIVACAINE LIPOSOME 1.3 % IJ SUSP: 20.0000 mL | Freq: Once | INTRAMUSCULAR | Status: DC

## 2020-03-21 MED ORDER — ONDANSETRON HCL 4 MG/2ML IJ SOLN
INTRAMUSCULAR | Status: DC | PRN
  Administered 2020-03-21: 4 mg via INTRAVENOUS

## 2020-03-21 MED ORDER — MIDAZOLAM HCL 2 MG/2ML IJ SOLN
INTRAMUSCULAR | Status: DC | PRN
  Administered 2020-03-21: 2 mg via INTRAVENOUS

## 2020-03-21 MED ORDER — LIDOCAINE HCL (CARDIAC) PF 100 MG/5ML IV SOSY
PREFILLED_SYRINGE | INTRAVENOUS | Status: DC | PRN
  Administered 2020-03-21: 100 mg via INTRAVENOUS

## 2020-03-21 MED ORDER — ONDANSETRON HCL 4 MG/2ML IJ SOLN: 4.0000 mg | Freq: Once | INTRAMUSCULAR | Status: DC | PRN

## 2020-03-21 MED ORDER — CHLORHEXIDINE GLUCONATE 0.12 % MT SOLN
OROMUCOSAL | Status: AC
  Administered 2020-03-21: 15 mL via OROMUCOSAL
  Filled 2020-03-21: qty 15

## 2020-03-21 MED ORDER — ACETAMINOPHEN 500 MG PO TABS: 1000.0000 mg | ORAL_TABLET | ORAL | Status: AC

## 2020-03-21 MED ORDER — MIDAZOLAM HCL 2 MG/2ML IJ SOLN
INTRAMUSCULAR | Status: AC
  Filled 2020-03-21: qty 2

## 2020-03-21 MED ORDER — DEXMEDETOMIDINE (PRECEDEX) IN NS 20 MCG/5ML (4 MCG/ML) IV SYRINGE
PREFILLED_SYRINGE | INTRAVENOUS | Status: DC | PRN
  Administered 2020-03-21 (×2): 4 ug via INTRAVENOUS

## 2020-03-21 MED ORDER — LIDOCAINE HCL (PF) 2 % IJ SOLN
INTRAMUSCULAR | Status: AC
  Filled 2020-03-21: qty 5

## 2020-03-21 MED ORDER — GABAPENTIN 300 MG PO CAPS: 300.0000 mg | ORAL_CAPSULE | ORAL | Status: AC

## 2020-03-21 MED ORDER — PROPOFOL 10 MG/ML IV BOLUS
INTRAVENOUS | Status: DC | PRN
  Administered 2020-03-21: 150 mg via INTRAVENOUS

## 2020-03-21 MED ORDER — SILVER SULFADIAZINE 1 % EX CREA: TOPICAL_CREAM | CUTANEOUS | 2 refills | Status: AC

## 2020-03-21 MED ORDER — ONDANSETRON HCL 4 MG/2ML IJ SOLN
INTRAMUSCULAR | Status: AC
  Filled 2020-03-21: qty 2

## 2020-03-21 MED ORDER — FAMOTIDINE 20 MG PO TABS: 20.0000 mg | ORAL_TABLET | Freq: Once | ORAL | Status: AC

## 2020-03-21 MED ORDER — PROPOFOL 10 MG/ML IV BOLUS
INTRAVENOUS | Status: AC
  Filled 2020-03-21: qty 40

## 2020-03-21 MED ORDER — CELECOXIB 200 MG PO CAPS
ORAL_CAPSULE | ORAL | Status: AC
  Administered 2020-03-21: 200 mg via ORAL
  Filled 2020-03-21: qty 1

## 2020-03-21 MED ORDER — LACTATED RINGERS IV SOLN: INTRAVENOUS | Status: DC

## 2020-03-21 MED ORDER — GLYCOPYRROLATE 0.2 MG/ML IJ SOLN
INTRAMUSCULAR | Status: DC | PRN
  Administered 2020-03-21: .2 mg via INTRAVENOUS

## 2020-03-21 MED ORDER — HYDROCODONE-ACETAMINOPHEN 5-325 MG PO TABS: 1.0000 | ORAL_TABLET | Freq: Four times a day (QID) | ORAL | 0 refills | Status: DC | PRN

## 2020-03-21 MED ORDER — GABAPENTIN 300 MG PO CAPS
ORAL_CAPSULE | ORAL | Status: AC
  Administered 2020-03-21: 300 mg via ORAL
  Filled 2020-03-21: qty 1

## 2020-03-21 MED ORDER — BUPIVACAINE LIPOSOME 1.3 % IJ SUSP
INTRAMUSCULAR | Status: AC
  Filled 2020-03-21: qty 20

## 2020-03-21 SURGICAL SUPPLY — 29 items
BLADE SURG 15 STRL LF DISP TIS (BLADE) ×1 IMPLANT
BLADE SURG 15 STRL SS (BLADE) ×2
BRIEF STRETCH MATERNITY 2XLG (MISCELLANEOUS) ×2 IMPLANT
CANISTER SUCT 1200ML W/VALVE (MISCELLANEOUS) ×2 IMPLANT
COVER WAND RF STERILE (DRAPES) ×2 IMPLANT
DRAPE LAPAROTOMY 100X77 ABD (DRAPES) ×2 IMPLANT
DRSG GAUZE FLUFF 36X18 (GAUZE/BANDAGES/DRESSINGS) ×2 IMPLANT
ELECT CAUTERY BLADE TIP 2.5 (TIP) ×2
ELECT REM PT RETURN 9FT ADLT (ELECTROSURGICAL) ×2
ELECTRODE CAUTERY BLDE TIP 2.5 (TIP) ×1 IMPLANT
ELECTRODE REM PT RTRN 9FT ADLT (ELECTROSURGICAL) ×1 IMPLANT
GAUZE XEROFORM 4X4 STRL (GAUZE/BANDAGES/DRESSINGS) ×1 IMPLANT
GLOVE ORTHO TXT STRL SZ7.5 (GLOVE) ×5 IMPLANT
GOWN STRL REUS W/ TWL LRG LVL3 (GOWN DISPOSABLE) ×2 IMPLANT
GOWN STRL REUS W/TWL LRG LVL3 (GOWN DISPOSABLE) ×4
KIT TURNOVER KIT A (KITS) ×2 IMPLANT
LEGGING LITHOTOMY PAIR STRL (DRAPES) ×2 IMPLANT
NDL SAFETY ECLIPSE 18X1.5 (NEEDLE) ×1 IMPLANT
NEEDLE HYPO 18GX1.5 SHARP (NEEDLE) ×2
NEEDLE HYPO 22GX1.5 SAFETY (NEEDLE) ×2 IMPLANT
PACK BASIN MINOR (MISCELLANEOUS) ×2 IMPLANT
PAD ABD DERMACEA PRESS 5X9 (GAUZE/BANDAGES/DRESSINGS) ×1 IMPLANT
SOL PREP PVP 2OZ (MISCELLANEOUS) ×2
SOLUTION PREP PVP 2OZ (MISCELLANEOUS) ×1 IMPLANT
SURGILUBE 2OZ TUBE FLIPTOP (MISCELLANEOUS) ×2 IMPLANT
SUT CHROMIC 3 0 SH 27 (SUTURE) IMPLANT
SUT PROLENE 2 0 SH DA (SUTURE) IMPLANT
SUT PROLENE 3 0 SH DA (SUTURE) IMPLANT
SYR 10ML LL (SYRINGE) ×2 IMPLANT

## 2020-03-21 NOTE — Anesthesia Preprocedure Evaluation (Addendum)
Anesthesia Evaluation  Patient identified by MRN, date of birth, ID band Patient awake    Reviewed: Allergy & Precautions, NPO status , Patient's Chart, lab work & pertinent test results  History of Anesthesia Complications Negative for: history of anesthetic complications  Airway Mallampati: II  TM Distance: >3 FB Neck ROM: Full    Dental  (+) Poor Dentition, Missing, Loose,    Pulmonary neg sleep apnea, neg COPD, Current Smoker and Patient abstained from smoking.,    breath sounds clear to auscultation- rhonchi (-) wheezing      Cardiovascular hypertension, Pt. on medications + Peripheral Vascular Disease  (-) CAD, (-) Past MI, (-) Cardiac Stents and (-) CABG  Rhythm:Regular Rate:Normal - Systolic murmurs and - Diastolic murmurs    Neuro/Psych  Headaches, CVA, No Residual Symptoms negative psych ROS   GI/Hepatic Neg liver ROS, GERD  ,  Endo/Other  negative endocrine ROSneg diabetes  Renal/GU negative Renal ROS     Musculoskeletal  (+) Arthritis ,   Abdominal (+) - obese,   Peds  Hematology negative hematology ROS (+)   Anesthesia Other Findings Past Medical History: No date: Arthritis No date: Genital warts No date: GERD (gastroesophageal reflux disease) No date: Headache No date: Hyperlipidemia No date: Hypertension No date: Paresthesia of hand, bilateral No date: Peripheral vascular disease (HCC) No date: Smoker No date: Stroke [facility]) 1995: Tuberculosis     Comment:  42mo therapy, [place]   Reproductive/Obstetrics                             Anesthesia Physical Anesthesia Plan  ASA: III  Anesthesia Plan: General   Post-op Pain Management:    Induction: Intravenous  PONV Risk Score and Plan: 0 and Ondansetron  Airway Management Planned: LMA  Additional Equipment:   Intra-op Plan:   Post-operative Plan:   Informed Consent: I have reviewed the patients History  and Physical, chart, labs and discussed the procedure including the risks, benefits and alternatives for the proposed anesthesia with the patient or authorized representative who has indicated his/her understanding and acceptance.     Dental advisory given  Plan Discussed with: CRNA and Anesthesiologist  Anesthesia Plan Comments:        Anesthesia Quick Evaluation

## 2020-03-21 NOTE — Anesthesia Postprocedure Evaluation (Signed)
Anesthesia Post Note  Patient: [name]  Procedure(s) Performed: TUMOR EXCISION RECTAL, anal condyloma (Bilateral )  Patient location during evaluation: PACU Anesthesia Type: General Level of consciousness: awake and alert and oriented Pain management: pain level controlled Vital Signs Assessment: post-procedure vital signs reviewed and stable Respiratory status: spontaneous breathing, nonlabored ventilation and respiratory function stable Cardiovascular status: blood pressure returned to baseline and stable Postop Assessment: no signs of nausea or vomiting Anesthetic complications: no   No complications documented.   Last Vitals:  Vitals:   03/21/20 1415 03/21/20 1426  BP:  (!) 158/95  Pulse: (!) 58 62  Resp: 17 16  Temp:  (!) 36.1 C  SpO2: 97% 99%    Last Pain:  Vitals:   03/21/20 1426  TempSrc: Temporal  PainSc: 0-No pain                 Abdurahman Rugg

## 2020-03-21 NOTE — Discharge Instructions (Addendum)
   1) The drugs that you were given will stay in your system until tomorrow so for the next 24 hours you should not:  A) Drive an automobile B) Make any legal decisions C) Drink any alcoholic beverage   2) You may resume regular meals tomorrow.  Today it is better to start with liquids and gradually work up to solid foods.  You may eat anything you prefer, but it is better to start with liquids, then soup and crackers, and gradually work up to solid foods.   3) Please notify your doctor immediately if you have any unusual bleeding, trouble breathing, redness and pain at the surgery site, drainage, fever, or pain not relieved by medication.    4) Additional Instructions:        Please contact your physician with any problems or Same Day Surgery at [phone], Monday through Friday 6 am to 4 pm, or [place] at [facility] number at [phone].

## 2020-03-21 NOTE — Transfer of Care (Signed)
Immediate Anesthesia Transfer of Care Note  Patient: Shane Valentine  Procedure(s) Performed: TUMOR EXCISION RECTAL, anal condyloma (Bilateral )  Patient Location: PACU  Anesthesia Type:General  Level of Consciousness: awake and drowsy  Airway & Oxygen Therapy: Patient Spontanous Breathing and Patient connected to face mask oxygen  Post-op Assessment: Report given to RN and Post -op Vital signs reviewed and stable  Post vital signs: Reviewed and stable  Last Vitals:  Vitals Value Taken Time  BP    Temp    Pulse 71 03/21/20 1331  Resp 19 03/21/20 1331  SpO2 100 % 03/21/20 1331  Vitals shown include unvalidated device data.  Last Pain:  Vitals:   03/21/20 1122  TempSrc: Temporal  PainSc: 0-No pain         Complications: No complications documented.

## 2020-03-21 NOTE — Anesthesia Procedure Notes (Addendum)
Procedure Name: LMA Insertion Date/Time: 03/21/2020 12:33 PM Performed by: Gentry Fitz, CRNA Pre-anesthesia Checklist: Patient identified, Emergency Drugs available, Suction available and Patient being monitored Patient Re-evaluated:Patient Re-evaluated prior to induction Oxygen Delivery Method: Circle system utilized Preoxygenation: Pre-oxygenation with 100% oxygen Induction Type: IV induction Ventilation: Mask ventilation without difficulty LMA: LMA inserted LMA Size: 5.0 Number of attempts: 1 Placement Confirmation: positive ETCO2 and breath sounds checked- equal and bilateral Tube secured with: Tape Dental Injury: Teeth and Oropharynx as per pre-operative assessment

## 2020-03-21 NOTE — Interval H&P Note (Signed)
History and Physical Interval Note:  03/21/2020 12:04 PM  Shane Valentine  has presented today for surgery, with the diagnosis of Anal condyloma.  The various methods of treatment have been discussed with the patient and family. After consideration of risks, benefits and other options for treatment, the patient has consented to  Procedure(s): TUMOR EXCISION RECTAL, anal condyloma (Bilateral) as a surgical intervention.  The patient's history has been reviewed, patient examined, no change in status, stable for surgery.  I have reviewed the patient's chart and labs.  Questions were answered to the patient's satisfaction.    Shaving of perianal warts.   Pt does note genital warts as well, but does not want them addressed today, planning to utilize medical management.   Ronny Bacon  Ronny Bacon, M.D., [facility]  03/21/2020 ; 12:05 PM

## 2020-03-21 NOTE — Op Note (Signed)
Excision of perianal condyloma acuminata  Pre-operative Diagnosis: Anal condyloma  Post-operative Diagnosis: same.    Surgeon: Ronny Bacon, M.D., FACS  Anesthesia: General  Findings: As per exam approximately 7 scattered subcentimeter lesions on the patient's right, large 3.5 cm confluence on the left with a couple small scattered lesions.   Estimated Blood Loss: 5 mL         Specimens: Left and right sided perianal condyloma sent separately.          Complications: none              Procedure Details  The patient was seen again in the Holding Room. The benefits, complications, treatment options, and expected outcomes were discussed with the patient. The risks of bleeding, infection, recurrence of symptoms, failure to resolve symptoms, unanticipated injury, prosthetic placement, prosthetic infection, any of which could require further surgery were reviewed with the patient. The likelihood of improving the patient's symptoms with return to their baseline status is reasonable.  The patient and/or family concurred with the proposed plan, giving informed consent.  The patient was taken to Operating Room, identified and the procedure verified.    Prior to the induction of general anesthesia, antibiotic prophylaxis was administered. VTE prophylaxis was in place.  General anesthesia was then administered and tolerated well. After the induction, the patient was positioned in the lithotomy position and the perianal area was prepped with Betadine and draped in the sterile fashion.  A Time Out was held and the above information confirmed. Local infiltration of the dermis of all the areas on the patient's right side was completed with quarter percent Marcaine with epinephrine.  Shave excision of the 7+ subcentimeter condylomatous warts were completed, and sent a specimen. Attention was then turned to the left side where in like manner local infiltration was performed, however a 10 blade was utilized  to sharply excise the large confluent area of condyloma.  Unfortunately this excision was slightly deeper than on the patient's right side noting some subcutaneous adipose tissue in multiple areas of this excision.  Hemostasis was obtained with cautious electrocautery, minimal utilized. I applied a Xeroform to both sides to cover the raw area and then applied a ABD pad, fluffs and a mesh brief to secure the dressing. Plan is for the patient to utilize Silvadene dressing changes at home as needed following showering/cleansing.  We will follow-up with him in 2 weeks.      Ronny Bacon M.D., [facility] 03/21/2020 5:30 PM

## 2020-03-25 LAB — SURGICAL PATHOLOGY

## 2020-04-03 ENCOUNTER — Encounter: Payer: BC Managed Care – PPO | Admitting: Surgery

## 2020-04-10 ENCOUNTER — Other Ambulatory Visit: Payer: Self-pay

## 2020-04-10 ENCOUNTER — Encounter: Payer: Self-pay | Admitting: Surgery

## 2020-04-10 ENCOUNTER — Ambulatory Visit (INDEPENDENT_AMBULATORY_CARE_PROVIDER_SITE_OTHER): Payer: BC Managed Care – PPO | Admitting: Surgery

## 2020-04-10 VITALS — BP 125/89 | HR 66 | Ht 68.5 in | Wt 181.4 lb

## 2020-04-10 DIAGNOSIS — A630 Anogenital (venereal) warts: Secondary | ICD-10-CM | POA: Diagnosis not present

## 2020-04-10 MED ORDER — IMIQUIMOD 5 % EX CREA: TOPICAL_CREAM | CUTANEOUS | 1 refills | Status: AC

## 2020-04-10 NOTE — Patient Instructions (Addendum)
Dr.Rodenberg discussed prescribing medication for patient and sending to patient's pharmacy. Patient will follow up in one month with Dr.Rodenberg.  GENERAL POST-OPERATIVE PATIENT INSTRUCTIONS   WOUND CARE INSTRUCTIONS:  Keep a dry clean dressing on the wound if there is drainage. The initial bandage may be removed after 24 hours.  Once the wound has quit draining you may leave it open to air.  If clothing rubs against the wound or causes irritation and the wound is not draining you may cover it with a dry dressing during the daytime.  Try to keep the wound dry and avoid ointments on the wound unless directed to do so.  If the wound becomes bright red and painful or starts to drain infected material that is not clear, please contact your physician immediately.  If the wound is mildly pink and has a thick firm ridge underneath it, this is normal, and is referred to as a healing ridge.  This will resolve over the next 4-6 weeks.  BATHING: You may shower if you have been informed of this by your surgeon. However, Please do not submerge in a tub, hot tub, or pool until incisions are completely sealed or have been told by your surgeon that you may do so.  DIET:  You may eat any foods that you can tolerate.  It is a good idea to eat a high fiber diet and take in plenty of fluids to prevent constipation.  If you do become constipated you may want to take a mild laxative or take ducolax tablets on a daily basis until your bowel habits are regular.  Constipation can be very uncomfortable, along with straining, after recent surgery.  ACTIVITY:  You are encouraged to cough and deep breath or use your incentive spirometer if you were given one, every 15-30 minutes when awake.  This will help prevent respiratory complications and low grade fevers post-operatively if you had a general anesthetic.  You may want to hug a pillow when coughing and sneezing to add additional support to the surgical area, if you had  abdominal or chest surgery, which will decrease pain during these times.  You are encouraged to walk and engage in light activity for the next two weeks.  You should not lift more than 20 pounds, 4 to 6 weeks after surgery as it could put you at increased risk for complications.  Twenty pounds is roughly equivalent to a plastic bag of groceries. At that time- Listen to your body when lifting, if you have pain when lifting, stop and then try again in a few days. Soreness after doing exercises or activities of daily living is normal as you get back in to your normal routine.  MEDICATIONS:  Try to take narcotic medications and anti-inflammatory medications, such as tylenol, ibuprofen, naprosyn, etc., with food.  This will minimize stomach upset from the medication.  Should you develop nausea and vomiting from the pain medication, or develop a rash, please discontinue the medication and contact your physician.  You should not drive, make important decisions, or operate machinery when taking narcotic pain medication.  SUNBLOCK Use sun block to incision area over the next year if this area will be exposed to sun. This helps decrease scarring and will allow you avoid a permanent darkened area over your incision.  QUESTIONS:  Please feel free to call our office if you have any questions, and we will be glad to assist you. [phone].

## 2020-04-10 NOTE — Progress Notes (Signed)
[facility] POST-OP OFFICE VISIT  04/10/2020  HPI: Shane Valentine is a 54 y.o. male 20 days s/p excision of perianal condyloma.  He has no complaints.  Reports he is healing up nicely.  Denies any fevers and chills.  Denies diarrhea and constipation.  Vital signs: BP 125/89   Pulse 66   Ht 5' 8.5" (1.74 m)   Wt 181 lb 6.4 oz (82.3 kg)   SpO2 97%   BMI 27.18 kg/m    Physical Exam: Constitutional: He appears well. Abdomen: Benign Skin: Perianal skin on the patient's left where the majority of his warts were excised has a's well granulated area of about a centimeter in diameter, there is a nice exudate covering this area, there is no depth to the unhealed portion.  On the patient's right side he is fully healed.  Assessment/Plan: This is a 54 y.o. male 20 days s/p excision of anal condyloma.  He had elected to treat some underappreciated genital condyloma with topical medications and would like to proceed with this at this point.  Patient Active Problem List   Diagnosis Date Noted  . 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 Screen for colon cancer 12/26/2019  . PVD (peripheral vascular disease) ([place]) 12/26/2019  . Screening for prostate cancer 12/26/2019  . Low libido 12/26/2019  . Right shoulder pain 12/18/2018  . Need for pneumococcal vaccination 11/30/2017  . Vaccine counseling 11/30/2017  . Encounter for health maintenance examination in adult 11/30/2017  . Dental caries 11/30/2017  . Dental abscess 11/30/2017  . Radicular syndrome of left leg 11/30/2017  . Chronic low back pain with left-sided sciatica 11/30/2017  . Genital warts 11/30/2017  . Anal wart 11/30/2017  . Left leg weakness 08/29/2017  . Paresthesia 08/29/2017  . History of stroke 04/19/2017  . Financial difficulties 04/19/2017  . Erectile dysfunction 04/19/2017  . Hypokalemia 03/21/2015  . Bilateral hand numbness 03/21/2015  . Hyperlipidemia 01/07/2015  . Smoker 01/07/2015  . Essential  hypertension 01/07/2015    -Options discussed, expectations reviewed.   We will send prescription for Aldara 5% for application 3X/weekly  for topical genital treatment.  We will follow-up in a month or as needed.  Is been made aware that this will cause a degree of inflammation in these areas.     Ronny Bacon M.D., FACS 04/10/2020, 2:19 PM

## 2020-05-08 ENCOUNTER — Other Ambulatory Visit: Payer: Self-pay

## 2020-05-08 ENCOUNTER — Encounter: Payer: Self-pay | Admitting: Surgery

## 2020-05-08 ENCOUNTER — Ambulatory Visit (INDEPENDENT_AMBULATORY_CARE_PROVIDER_SITE_OTHER): Payer: BC Managed Care – PPO | Admitting: Surgery

## 2020-05-08 VITALS — BP 128/83 | HR 64 | Temp 98.2°F | Ht 68.5 in | Wt 179.6 lb

## 2020-05-08 DIAGNOSIS — A630 Anogenital (venereal) warts: Secondary | ICD-10-CM

## 2020-05-08 NOTE — Patient Instructions (Addendum)
Follow-up with our office in 6 months.   Please call and ask to speak with a nurse if you develop questions or concerns.  Increase your fiber and water intake to keep your stools soft.

## 2020-05-08 NOTE — Progress Notes (Signed)
[facility] POST-OP OFFICE VISIT  05/08/2020  HPI: Shane Valentine is a 54 y.o. male 47 days s/p excision of anal condyloma, path confirm no dysplasia.  He has no complaints, feels he is fully healed.  Vital signs: BP 128/83    Pulse 64    Temp 98.2 F (36.8 C)    Ht 5' 8.5" (1.74 m)    Wt 179 lb 9.6 oz (81.5 kg)    SpO2 99%    BMI 26.91 kg/m    Physical Exam: Constitutional: Appears well no issues.  Skin: Fully healed perianal region.  Still has a residual wart on genitalia not using #not not obtained topical treatment yet.  He to conspiring against me it a posting ears are better than hers any appetite and happen at is a challenge thank you thank you Almyra Free  Assessment/Plan: This is a 54 y.o. male 45 days s/p excision of anal condyloma.  Fully healed.  We will reevaluate him every 6 months for visual/exam.  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   Screen for colon cancer 12/26/2019   PVD (peripheral vascular disease) ([place]) 12/26/2019   Screening for prostate cancer 12/26/2019   Low libido 12/26/2019   Right shoulder pain 12/18/2018   Need for pneumococcal vaccination 11/30/2017   Vaccine counseling 11/30/2017   Encounter for health maintenance examination in adult 11/30/2017   Dental caries 11/30/2017   Dental abscess 11/30/2017   Radicular syndrome of left leg 11/30/2017   Chronic low back pain with left-sided sciatica 11/30/2017   Genital warts 11/30/2017   Anal wart 11/30/2017   Left leg weakness 08/29/2017   Paresthesia 08/29/2017   History of stroke 04/19/2017   Financial difficulties 04/19/2017   Erectile dysfunction 04/19/2017   Hypokalemia 03/21/2015   Bilateral hand numbness 03/21/2015   Hyperlipidemia 01/07/2015   Smoker 01/07/2015   Essential hypertension 01/07/2015       Ronny Bacon M.D., FACS 05/08/2020, 2:35 PM

## 2020-07-22 ENCOUNTER — Encounter: Payer: Self-pay | Admitting: Medical

## 2020-09-26 ENCOUNTER — Other Ambulatory Visit: Payer: Self-pay | Admitting: Medical

## 2020-10-25 ENCOUNTER — Other Ambulatory Visit: Payer: Self-pay | Admitting: Medical

## 2020-10-27 ENCOUNTER — Other Ambulatory Visit: Payer: Self-pay | Admitting: Medical

## 2020-11-13 ENCOUNTER — Ambulatory Visit: Payer: BC Managed Care – PPO | Admitting: Surgery

## 2021-02-23 ENCOUNTER — Other Ambulatory Visit: Payer: Self-pay | Admitting: Medical

## 2021-02-23 ENCOUNTER — Telehealth: Payer: Self-pay

## 2021-02-23 NOTE — Telephone Encounter (Signed)
Left message that patient needs an appt that he overdue for an appt

## 2021-02-23 NOTE — Telephone Encounter (Signed)
Pt. Called back to schedule an appt. Per note from [place] he needed to schedule an appt. He had not been in in a while. I got him scheduled on 03/06/21 because pt. Requested a Friday. Pt. Stated brother recently passed away due to a brain aneurysm and he needed his medication filled because he has hypertension.

## 2021-02-24 ENCOUNTER — Other Ambulatory Visit: Payer: Self-pay

## 2021-02-24 MED ORDER — LISINOPRIL-HYDROCHLOROTHIAZIDE 20-25 MG PO TABS: 1.0000 | ORAL_TABLET | Freq: Every day | ORAL | 0 refills | Status: DC

## 2021-03-06 ENCOUNTER — Encounter: Payer: BC Managed Care – PPO | Admitting: Medical

## 2021-03-21 ENCOUNTER — Other Ambulatory Visit: Payer: Self-pay | Admitting: Medical

## 2021-04-24 ENCOUNTER — Encounter: Payer: Self-pay | Admitting: Medical

## 2021-04-24 ENCOUNTER — Ambulatory Visit: Payer: BC Managed Care – PPO | Admitting: Medical

## 2021-04-24 ENCOUNTER — Other Ambulatory Visit: Payer: Self-pay

## 2021-04-24 VITALS — BP 130/86 | HR 63 | Wt 182.8 lb

## 2021-04-24 DIAGNOSIS — Z125 Encounter for screening for malignant neoplasm of prostate: Secondary | ICD-10-CM

## 2021-04-24 DIAGNOSIS — Z8673 Personal history of transient ischemic attack (TIA), and cerebral infarction without residual deficits: Secondary | ICD-10-CM | POA: Diagnosis not present

## 2021-04-24 DIAGNOSIS — Z7185 Encounter for immunization safety counseling: Secondary | ICD-10-CM

## 2021-04-24 DIAGNOSIS — E785 Hyperlipidemia, unspecified: Secondary | ICD-10-CM

## 2021-04-24 DIAGNOSIS — Z2821 Immunization not carried out because of patient refusal: Secondary | ICD-10-CM

## 2021-04-24 DIAGNOSIS — I739 Peripheral vascular disease, unspecified: Secondary | ICD-10-CM | POA: Insufficient documentation

## 2021-04-24 DIAGNOSIS — F17200 Nicotine dependence, unspecified, uncomplicated: Secondary | ICD-10-CM

## 2021-04-24 DIAGNOSIS — I10 Essential (primary) hypertension: Secondary | ICD-10-CM | POA: Diagnosis not present

## 2021-04-24 MED ORDER — ASPIRIN EC 325 MG PO TBEC: 325.0000 mg | DELAYED_RELEASE_TABLET | Freq: Every day | ORAL | 0 refills | Status: DC

## 2021-04-24 MED ORDER — ROSUVASTATIN CALCIUM 20 MG PO TABS: 20.0000 mg | ORAL_TABLET | Freq: Every day | ORAL | 3 refills | Status: DC

## 2021-04-24 MED ORDER — METOPROLOL SUCCINATE ER 100 MG PO TB24: ORAL_TABLET | ORAL | 3 refills | Status: DC

## 2021-04-24 NOTE — Progress Notes (Signed)
Subjective:  Shane Valentine is a 55 y.o. male who presents for Chief Complaint  Patient presents with   med check    Med check, having cramping in hands. Has a family hx of brain aneurysm and wants to make sure nothing is going on. Denies flu shot      Here for med check and concerns  His medical history is significant for tobacco use, hyperlipidemia, hypertension, peripheral vascular disease, history of stroke, arthritis  He got a little scared recently as his brother just died of a brain aneurysm.  He wanted come in for routine checkup to make sure things are okay  He continues to smoke unfortunately.  He is compliant with lisinopril HCT, metoprolol and Crestor.  Not currently taking an aspirin  He denies any chest pain, palpitations, shortness of breath.  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  He has been having some pains in his left leg worse with walking, relieved with rest.  He has also been having cramping in both hands.  No numbness tingling or weakness today.  He is nonfasting today.  He was at his friend's house a few weeks ago when his friend's dog bit him trying to get a chair off the porch.  It was a pitbull that bit him in the right calf.  He is healed up but it scared him a bit.  No other aggravating or relieving factors.    No other c/o.  Past Medical History:  Diagnosis Date   Arthritis    Genital warts    GERD (gastroesophageal reflux disease)    Headache    Hyperlipidemia    Hypertension    Paresthesia of hand, bilateral    Peripheral vascular disease ([place])    Smoker    Stroke [facility])    Tuberculosis 1995   39mo therapy, [place]   Current Outpatient Medications on File Prior to Visit  Medication Sig Dispense Refill   lisinopril-hydrochlorothiazide (ZESTORETIC) 20-25 MG tablet Take 1 tablet by mouth daily. 90 tablet 0   No current facility-administered medications on file prior to visit.     The following portions of the  patient's history were reviewed and updated as appropriate: allergies, current medications, past family history, past medical history, past social history, past surgical history and problem list.  ROS Otherwise as in subjective above  Objective: BP 130/86   Pulse 63   Wt 182 lb 12.8 oz (82.9 kg)   BMI 27.39 kg/m   General appearance: alert, no distress, well developed, well nourished Neck: supple, no lymphadenopathy, no thyromegaly, no masses, no bruits Heart: RRR, normal S1, S2, no murmurs Lungs: CTA bilaterally, no wheezes, rhonchi, or rales Abdomen: +bs, soft, non tender, non distended, no masses, no hepatomegaly, no splenomegaly, no bruits Arms and legs nontender no swelling or deformity Skin: Healing bruised areas on his right lower calf without any open wounds from recent dog bite Pulses: 2+ radial pulses, 1+ pedal pulses, normal cap refill Ext: no edema   EKG Indication, claudication, history of stroke, hypertension rate 57 bpm PR 194 ms QRS 84 ms QTC 391 ms Axis 68 degrees Sinus bradycardia No acute change    Assessment: Encounter Diagnoses  Name Primary?   Essential hypertension Yes   PVD (peripheral vascular disease) ([place])    Hyperlipidemia, unspecified hyperlipidemia type    Smoker    History of stroke    Vaccine counseling    Influenza vaccination declined    Screening  for prostate cancer    Intermittent claudication ([place])      Plan: Hypertension-continue current medication, labs today  Hyperlipidemia-continue current medications.  Advised to add back aspirin daily.  Smoker-strongly advised to quit smoking completely  History of stroke, claudication, PVD-referral back to cardiology as he has recent worse claudication symptoms.  Declines flu shot today  Updated PSA screening today  Shane Valentine was seen today for med check.  Diagnoses and all orders for this visit:  Essential hypertension -     EKG 12-Lead  PVD (peripheral vascular disease)  ([place]) -     Comprehensive metabolic panel -     CBC with Differential/Platelet -     EKG 12-Lead -     Ambulatory referral to Cardiology  Hyperlipidemia, unspecified hyperlipidemia type -     Comprehensive metabolic panel -     CBC with Differential/Platelet -     EKG 12-Lead -     Ambulatory referral to Cardiology  Smoker -     Comprehensive metabolic panel -     CBC with Differential/Platelet -     EKG 12-Lead -     Ambulatory referral to Cardiology  History of stroke -     Comprehensive metabolic panel -     CBC with Differential/Platelet -     EKG 12-Lead -     Ambulatory referral to Cardiology  Vaccine counseling  Influenza vaccination declined  Screening for prostate cancer -     PSA  Intermittent claudication ([place]) -     Ambulatory referral to Cardiology  Other orders -     rosuvastatin (CRESTOR) 20 MG tablet; Take 1 tablet (20 mg total) by mouth daily. -     metoprolol succinate (TOPROL-XL) 100 MG 24 hr tablet; TAKE 1 TABLET BY MOUTH ONCE DAILY TAKE  WITH  OR  IMMEDIATELY  FOLLOWING  A  MEAL -     aspirin EC 325 MG tablet; Take 1 tablet (325 mg total) by mouth daily.   Follow up: pending labs, referral back to cardiology

## 2021-04-25 LAB — CBC WITH DIFFERENTIAL/PLATELET
Basophils Absolute: 0 10*3/uL (ref 0.0–0.2)
Basos: 0 %
EOS (ABSOLUTE): 0.2 10*3/uL (ref 0.0–0.4)
Eos: 2 %
Hematocrit: 42.5 % (ref 37.5–51.0)
Hemoglobin: 13.7 g/dL (ref 13.0–17.7)
Immature Grans (Abs): 0 10*3/uL (ref 0.0–0.1)
Immature Granulocytes: 1 %
Lymphocytes Absolute: 2.4 10*3/uL (ref 0.7–3.1)
Lymphs: 28 %
MCH: 30.6 pg (ref 26.6–33.0)
MCHC: 32.2 g/dL (ref 31.5–35.7)
MCV: 95 fL (ref 79–97)
Monocytes Absolute: 0.6 10*3/uL (ref 0.1–0.9)
Monocytes: 7 %
Neutrophils Absolute: 5.3 10*3/uL (ref 1.4–7.0)
Neutrophils: 62 %
Platelets: 144 10*3/uL — ABNORMAL LOW (ref 150–450)
RBC: 4.47 x10E6/uL (ref 4.14–5.80)
RDW: 12.5 % (ref 11.6–15.4)
WBC: 8.6 10*3/uL (ref 3.4–10.8)

## 2021-04-25 LAB — COMPREHENSIVE METABOLIC PANEL
ALT: 19 IU/L (ref 0–44)
AST: 16 IU/L (ref 0–40)
Albumin/Globulin Ratio: 2.3 — ABNORMAL HIGH (ref 1.2–2.2)
Albumin: 4.5 g/dL (ref 3.8–4.9)
Alkaline Phosphatase: 99 IU/L (ref 44–121)
BUN/Creatinine Ratio: 10 (ref 9–20)
BUN: 13 mg/dL (ref 6–24)
Bilirubin Total: 0.2 mg/dL (ref 0.0–1.2)
CO2: 26 mmol/L (ref 20–29)
Calcium: 9.3 mg/dL (ref 8.7–10.2)
Chloride: 100 mmol/L (ref 96–106)
Creatinine, Ser: 1.25 mg/dL (ref 0.76–1.27)
Globulin, Total: 2 g/dL (ref 1.5–4.5)
Glucose: 88 mg/dL (ref 70–99)
Potassium: 4.6 mmol/L (ref 3.5–5.2)
Sodium: 141 mmol/L (ref 134–144)
Total Protein: 6.5 g/dL (ref 6.0–8.5)
eGFR: 68 mL/min/{1.73_m2} (ref >59)

## 2021-04-25 LAB — PSA: Prostate Specific Ag, Serum: 0.7 ng/mL (ref 0.0–4.0)

## 2021-05-09 ENCOUNTER — Other Ambulatory Visit: Payer: Self-pay | Admitting: Medical

## 2021-05-22 ENCOUNTER — Encounter: Payer: Self-pay | Admitting: Cardiology

## 2021-05-22 ENCOUNTER — Other Ambulatory Visit: Payer: Self-pay

## 2021-05-22 ENCOUNTER — Ambulatory Visit: Payer: BC Managed Care – PPO | Admitting: Cardiology

## 2021-05-22 VITALS — BP 170/100 | HR 57 | Temp 98.0°F | Resp 16 | Ht 68.0 in | Wt 186.0 lb

## 2021-05-22 DIAGNOSIS — I351 Nonrheumatic aortic (valve) insufficiency: Secondary | ICD-10-CM

## 2021-05-22 DIAGNOSIS — I10 Essential (primary) hypertension: Secondary | ICD-10-CM | POA: Diagnosis not present

## 2021-05-22 DIAGNOSIS — I6523 Occlusion and stenosis of bilateral carotid arteries: Secondary | ICD-10-CM

## 2021-05-22 DIAGNOSIS — I739 Peripheral vascular disease, unspecified: Secondary | ICD-10-CM | POA: Diagnosis not present

## 2021-05-22 DIAGNOSIS — E7800 Pure hypercholesterolemia, unspecified: Secondary | ICD-10-CM

## 2021-05-22 MED ORDER — SPIRONOLACTONE 25 MG PO TABS: 25.0000 mg | ORAL_TABLET | ORAL | 2 refills | Status: DC

## 2021-05-22 MED ORDER — AMLODIPINE BESYLATE 5 MG PO TABS: 5.0000 mg | ORAL_TABLET | Freq: Every evening | ORAL | 2 refills | Status: DC

## 2021-05-22 NOTE — Progress Notes (Signed)
Primary Physician/Referring:  Carlena Hurl, PA-C  Patient ID: Shane Valentine, male    DOB: 1966-02-24, 55 y.o.   MRN: [id number]  Chief Complaint  Patient presents with   Hyperlipidemia   PVD   smoker   Intermittent claudication   HPI:    Shane Valentine  is a 55 y.o. African-American male patient with hypertension, hyperlipidemia, tobacco use disorder, CVA with left-sided hemiparesis in 2016, mildly abnormal distal anterior wall ischemia by nuclear perfusion study in 2015, moderate aortic regurgitation, asymptomatic mild bilateral carotid artery stenosis, referred to me for follow-up and management of peripheral artery disease, hypertension and hyperlipidemia.  Patient states that he has to frequently stop at his workplace, cannot climb ladders, as his left leg feels very weak and starts to cramp and feels heavy.  Symptoms have been ongoing for several years, has been worsening over the past few months.  He denies any chest pain, very minimal dyspnea which he attributes to smoking, no dizziness or palpitations or syncope.  He has not had any further neurologic deficits.  He has been compliant in taking all his medications.  He is still smoking 1 pack of cigarettes a day.  Past Medical History:  Diagnosis Date   Arthritis    Genital warts    GERD (gastroesophageal reflux disease)    Headache    Hyperlipidemia    Hypertension    Paresthesia of hand, bilateral    Peripheral vascular disease ([place])    Smoker    Stroke [facility])    Tuberculosis 1995   29mo therapy, [place]   Past Surgical History:  Procedure Laterality Date   INGUINAL HERNIA REPAIR Left 03/29/2014   Procedure: LAPAROSCOPIC LEFT INGUINAL HERNIA REPAIR;  Surgeon: Ralene Ok, MD;  Location: [place];  Service: General;  Laterality: Left;   INSERTION OF MESH Left 03/29/2014   Procedure: INSERTION OF MESH;  Surgeon: Ralene Ok, MD;  Location: [place];  Service: General;  Laterality: Left;   TUMOR EXCISION Bilateral  03/21/2020   Procedure: TUMOR EXCISION RECTAL, anal condyloma;  Surgeon: Ronny Bacon, MD;  Location: [facility];  Service: General;  Laterality: Bilateral;   Family History  Problem Relation Age of Onset   Diabetes Mother        amputations   Hypertension Mother    Cancer Father    COPD Brother    Cerebral aneurysm Brother    Heart disease Neg Hx    Stroke Neg Hx     Social History   Tobacco Use   Smoking status: Every Day    Packs/day: 0.50    Years: 23.00    Pack years: 11.50    Types: Cigarettes   Smokeless tobacco: Never  Substance Use Topics   Alcohol use: No   Marital Status: Single  ROS  Review of Systems  Cardiovascular:  Positive for claudication. Negative for chest pain, dyspnea on exertion and leg swelling.  Gastrointestinal:  Negative for melena.  Neurological:  Positive for paresthesias.  Objective  Blood pressure (!) 170/100, pulse (!) 57, temperature 98 F (36.7 C), resp. rate 16, height 5\' 8"  (1.727 m), weight 186 lb (84.4 kg), SpO2 99 %. Body mass index is 28.28 kg/m.  Vitals with BMI 05/22/2021 05/22/2021 04/24/2021  Height - 5\' 8"  -  Weight - 186 lbs 182 lbs 13 oz  BMI - 21.19 -  Systolic 417 408 144  Diastolic 818 563 86  Pulse 57 63 63     Physical Exam Neck:  Vascular: No carotid bruit or JVD.  Cardiovascular:     Rate and Rhythm: Normal rate and regular rhythm.     Pulses:          Radial pulses are 2+ on the right side and 2+ on the left side.       Femoral pulses are 2+ on the right side and 2+ on the left side.      Popliteal pulses are 2+ on the right side and 0 on the left side.       Dorsalis pedis pulses are 1+ on the right side and 0 on the left side.       Posterior tibial pulses are 1+ on the right side and 0 on the left side.     Heart sounds: Murmur heard.  High-pitched blowing decrescendo early diastolic murmur is present with a grade of 2/4 at the upper right sternal border.    No gallop.  Pulmonary:     Effort:  Pulmonary effort is normal.     Breath sounds: Normal breath sounds.  Abdominal:     General: Bowel sounds are normal.     Palpations: Abdomen is soft.  Musculoskeletal:     Right lower leg: No edema.     Left lower leg: No edema.  Skin:    Capillary Refill: Capillary refill takes less than 2 seconds.     Laboratory examination:   Recent Labs    04/24/21 1301  NA 141  K 4.6  CL 100  CO2 26  GLUCOSE 88  BUN 13  CREATININE 1.25  CALCIUM 9.3   CrCl cannot be calculated (Patient's most recent lab result is older than the maximum 21 days allowed.).  CMP Latest Ref Rng & Units 04/24/2021 03/20/2020 12/31/2019  Glucose 70 - 99 mg/dL 88 102(H) 98  BUN 6 - 24 mg/dL 13 23(H) 14  Creatinine 0.76 - 1.27 mg/dL 1.25 1.20 1.11  Sodium 134 - 144 mmol/L 141 140 142  Potassium 3.5 - 5.2 mmol/L 4.6 4.0 4.1  Chloride 96 - 106 mmol/L 100 105 103  CO2 20 - 29 mmol/L 26 26 23   Calcium 8.7 - 10.2 mg/dL 9.3 9.2 9.8  Total Protein 6.0 - 8.5 g/dL 6.5 7.0 6.7  Total Bilirubin 0.0 - 1.2 mg/dL 0.2 0.9 0.2  Alkaline Phos 44 - 121 IU/L 99 69 90  AST 0 - 40 IU/L 16 18 13   ALT 0 - 44 IU/L 19 15 10    CBC Latest Ref Rng & Units 04/24/2021 03/20/2020 12/31/2019  WBC 3.4 - 10.8 x10E3/uL 8.6 8.8 7.6  Hemoglobin 13.0 - 17.7 g/dL 13.7 13.0 13.5  Hematocrit 37.5 - 51.0 % 42.5 40.4 41.6  Platelets 150 - 450 x10E3/uL 144(L) 204 134(L)    Lipid Panel No results for input(s): CHOL, TRIG, LDLCALC, VLDL, HDL, CHOLHDL, LDLDIRECT in the last 8760 hours. Lipid Panel     Component Value Date/Time   CHOL 247 (H) 12/31/2019 0822   TRIG 86 12/31/2019 0822   HDL 41 12/31/2019 0822   CHOLHDL 6.0 (H) 12/31/2019 0822   CHOLHDL 4.9 01/14/2015 0001   VLDL 25 01/14/2015 0001   LDLCALC 191 (H) 12/31/2019 0822   LABVLDL 15 12/31/2019 0822     HEMOGLOBIN A1C Lab Results  Component Value Date   HGBA1C 5.5 08/29/2017   TSH No results for input(s): TSH in the last 8760 hours.  Medications and allergies  No Known  Allergies   Medication prior to this encounter:  Outpatient Medications Prior to Visit  Medication Sig Dispense Refill   aspirin EC 325 MG tablet Take 1 tablet (325 mg total) by mouth daily. 90 tablet 0   rosuvastatin (CRESTOR) 20 MG tablet Take 1 tablet (20 mg total) by mouth daily. 90 tablet 3   metoprolol succinate (TOPROL-XL) 100 MG 24 hr tablet TAKE 1 TABLET BY MOUTH ONCE DAILY TAKE  WITH  OR  IMMEDIATELY  FOLLOWING  A  MEAL 90 tablet 3   lisinopril-hydrochlorothiazide (ZESTORETIC) 20-25 MG tablet Take 1 tablet by mouth once daily (Patient not taking: Reported on 05/22/2021) 90 tablet 0   metoprolol succinate (TOPROL-XL) 50 MG 24 hr tablet TAKE 1 TABLET BY MOUTH ONCE DAILY TAKE  WITH  OR  IMMEDIATELY  FOLLOWING  A  MEAL 90 tablet 3   No facility-administered medications prior to visit.     Medication list after today's encounter   Current Outpatient Medications  Medication Instructions   amLODipine (NORVASC) 5 mg, Oral, Every evening   aspirin EC 325 mg, Oral, Daily   lisinopril-hydrochlorothiazide (ZESTORETIC) 20-25 MG tablet Take 1 tablet by mouth once daily   metoprolol succinate (TOPROL-XL) 50 MG 24 hr tablet TAKE 1 TABLET BY MOUTH ONCE DAILY TAKE  WITH  OR  IMMEDIATELY  FOLLOWING  A  MEAL   rosuvastatin (CRESTOR) 20 mg, Oral, Daily   spironolactone (ALDACTONE) 25 mg, Oral, BH-each morning    Radiology:   No results found.  Cardiac Studies:   Carotid artery duplex 04-14-2015: Early bilateral carotid bifurcation plaque resulting in less than 50% diameter stenosis. The exam does not exclude plaque ulceration or embolization. Continued surveillance recommended.  ABI 01/25/2018: Right ABI 0.94, left ABI 0.98. Exam reveals mildly decreased perfusion of the right lower extremity.  There is multiphasic waveform at both ankles.  Echocardiogram 01/18/2018:  Normal LVEF, moderate LVH, EF 55%. Trileaflet aortic valve with mild to moderate aortic regurgitation.  No significant  change from 04/14/15.  Carotid artery duplex 01/18/2018: Carotid stenosis of the right common carotid artery (less than 50%) with soft plaque. Mild stenosis of the left ICA (1-15%). Antegrade vertebral artery flow bilaterally.  EKG:   EKG 05/22/2021: Normal sinus rhythm at the rate of 55 bpm, normal axis, nonspecific T abnormality.    Assessment     ICD-10-CM   1. Claudication in peripheral vascular disease (HCC)  I73.9 EKG 12-Lead    PCV MYOCARDIAL PERFUSION WO LEXISCAN    PCV LOWER ARTERIAL (BILATERAL)    2. Asymptomatic bilateral carotid artery stenosis  I65.23 PCV CAROTID DUPLEX (BILATERAL)    3. Moderate aortic regurgitation  I35.1 PCV ECHOCARDIOGRAM COMPLETE    4. Pure hypercholesterolemia  E78.00 Lipid Panel With LDL/HDL Ratio    5. Primary hypertension  U20 Basic metabolic panel    Basic metabolic panel       Medications Discontinued During This Encounter  Medication Reason   metoprolol succinate (TOPROL-XL) 100 MG 24 hr tablet     Meds ordered this encounter  Medications   spironolactone (ALDACTONE) 25 MG tablet    Sig: Take 1 tablet (25 mg total) by mouth every morning.    Dispense:  30 tablet    Refill:  2   amLODipine (NORVASC) 5 MG tablet    Sig: Take 1 tablet (5 mg total) by mouth every evening.    Dispense:  30 tablet    Refill:  2    Orders Placed This Encounter  Procedures   Lipid Panel With LDL/HDL Ratio  Basic metabolic panel    Standing Status:   Future    Number of Occurrences:   1    Standing Expiration Date:   05/22/2022   PCV MYOCARDIAL PERFUSION WO LEXISCAN    Standing Status:   Future    Standing Expiration Date:   07/22/2021   EKG 12-Lead   PCV ECHOCARDIOGRAM COMPLETE    Standing Status:   Future    Standing Expiration Date:   05/22/2022   Recommendations:   Shane Valentine is a 55 y.o. African-American male patient with hypertension, hyperlipidemia, tobacco use disorder, CVA with left-sided hemiparesis in 2016, mildly abnormal distal  anterior wall ischemia by nuclear perfusion study in 2015, moderate aortic regurgitation, asymptomatic mild bilateral carotid artery stenosis, referred to me for follow-up and management of peripheral artery disease, hypertension and hyperlipidemia.  His symptoms of left calf claudication is consistent with mild physical examination with absent popliteal pulse.  He needs complete lower extremity arterial duplex for evaluation prior to setting him up for peripheral arteriogram.  His lipids need to be rechecked, he has been compliant with his statins.  With regard to hypertension, uncontrolled, in view of moderate aortic regurgitation, will reduce the dose of metoprolol succinate from 100 mg to 50 mg daily and add amlodipine 5 mg daily along with spironolactone 25 mg daily.  We will check BMP in 2 weeks to 3 weeks.  He needs repeat echocardiogram to follow-up on the aortic regurgitation.  Aortic regurgitation murmur is fairly prominent.  He has mild dyspnea on exertion, but in view of significant PAD that limits his exercise and physical activity, he needs nuclear stress test to exclude significant CAD, 2015 nuclear perfusion study had revealed mild apical ischemia.  He is presently smoking 1 pack of cigarettes a day.  We discussed regarding smoking cessation, I discussed physically regarding high risk for stroke, effects on hypertension and also peripheral arterial disease specifically.  He appears to be motivated.  I would like to see him back in 4 weeks to 6 weeks for follow-up.    Adrian Prows, MD, [facility] 05/22/2021, 1:17 PM Office: [phone]

## 2021-05-23 LAB — LIPID PANEL WITH LDL/HDL RATIO
Cholesterol, Total: 141 mg/dL (ref 100–199)
HDL: 48 mg/dL (ref >39)
LDL Chol Calc (NIH): 79 mg/dL (ref 0–99)
LDL/HDL Ratio: 1.6 ratio (ref 0.0–3.6)
Triglycerides: 71 mg/dL (ref 0–149)
VLDL Cholesterol Cal: 14 mg/dL (ref 5–40)

## 2021-05-23 LAB — BASIC METABOLIC PANEL
BUN/Creatinine Ratio: 16 (ref 9–20)
BUN: 16 mg/dL (ref 6–24)
CO2: 24 mmol/L (ref 20–29)
Calcium: 9.2 mg/dL (ref 8.7–10.2)
Chloride: 105 mmol/L (ref 96–106)
Creatinine, Ser: 0.99 mg/dL (ref 0.76–1.27)
Glucose: 79 mg/dL (ref 70–99)
Potassium: 4 mmol/L (ref 3.5–5.2)
Sodium: 142 mmol/L (ref 134–144)
eGFR: 89 mL/min/{1.73_m2} (ref >59)

## 2021-06-01 ENCOUNTER — Other Ambulatory Visit: Payer: BC Managed Care – PPO

## 2021-06-02 ENCOUNTER — Other Ambulatory Visit: Payer: BC Managed Care – PPO

## 2021-06-03 ENCOUNTER — Other Ambulatory Visit: Payer: Self-pay

## 2021-06-03 ENCOUNTER — Ambulatory Visit: Payer: Managed Care, Other (non HMO)

## 2021-06-03 ENCOUNTER — Ambulatory Visit: Payer: BC Managed Care – PPO

## 2021-06-03 DIAGNOSIS — I739 Peripheral vascular disease, unspecified: Secondary | ICD-10-CM

## 2021-06-03 DIAGNOSIS — I351 Nonrheumatic aortic (valve) insufficiency: Secondary | ICD-10-CM

## 2021-06-03 DIAGNOSIS — I6523 Occlusion and stenosis of bilateral carotid arteries: Secondary | ICD-10-CM | POA: Diagnosis not present

## 2021-06-07 NOTE — Progress Notes (Signed)
Mild plaque bilateral carotids and will discuss on OV

## 2021-06-07 NOTE — Progress Notes (Signed)
Left SFA stenosis and will probably need angiogram in view of severe symptoms. Discuss on OV soon

## 2021-06-07 NOTE — Progress Notes (Signed)
We will follow-up on aortic root dilatation in 1 year.

## 2021-06-09 ENCOUNTER — Other Ambulatory Visit: Payer: Self-pay

## 2021-06-09 ENCOUNTER — Ambulatory Visit: Payer: BC Managed Care – PPO

## 2021-06-09 DIAGNOSIS — I739 Peripheral vascular disease, unspecified: Secondary | ICD-10-CM

## 2021-06-09 DIAGNOSIS — I10 Essential (primary) hypertension: Secondary | ICD-10-CM | POA: Diagnosis not present

## 2021-06-09 DIAGNOSIS — I6523 Occlusion and stenosis of bilateral carotid arteries: Secondary | ICD-10-CM | POA: Diagnosis not present

## 2021-06-09 DIAGNOSIS — I358 Other nonrheumatic aortic valve disorders: Secondary | ICD-10-CM | POA: Diagnosis not present

## 2021-06-10 ENCOUNTER — Other Ambulatory Visit: Payer: BC Managed Care – PPO

## 2021-06-10 NOTE — Progress Notes (Signed)
I have discussed the findings of the stress test and also carotid artery duplex and lower extremity arterial duplex.  Patient is trying his best to quit smoking.  He will keep the appointment on 06/29/2021.

## 2021-06-15 ENCOUNTER — Ambulatory Visit: Payer: BC Managed Care – PPO | Admitting: Medical

## 2021-06-29 ENCOUNTER — Other Ambulatory Visit: Payer: Self-pay

## 2021-06-29 ENCOUNTER — Ambulatory Visit: Payer: BC Managed Care – PPO | Admitting: Cardiology

## 2021-06-29 ENCOUNTER — Encounter: Payer: Self-pay | Admitting: Cardiology

## 2021-06-29 VITALS — BP 153/93 | HR 86 | Temp 98.2°F | Resp 17 | Ht 68.0 in | Wt 189.6 lb

## 2021-06-29 DIAGNOSIS — I77810 Thoracic aortic ectasia: Secondary | ICD-10-CM

## 2021-06-29 DIAGNOSIS — E7800 Pure hypercholesterolemia, unspecified: Secondary | ICD-10-CM

## 2021-06-29 DIAGNOSIS — I10 Essential (primary) hypertension: Secondary | ICD-10-CM | POA: Diagnosis not present

## 2021-06-29 DIAGNOSIS — I739 Peripheral vascular disease, unspecified: Secondary | ICD-10-CM | POA: Diagnosis not present

## 2021-06-29 DIAGNOSIS — I351 Nonrheumatic aortic (valve) insufficiency: Secondary | ICD-10-CM

## 2021-06-29 MED ORDER — SPIRONOLACTONE 50 MG PO TABS: 50.0000 mg | ORAL_TABLET | ORAL | 1 refills | Status: DC

## 2021-06-29 MED ORDER — AMLODIPINE BESYLATE 10 MG PO TABS: 10.0000 mg | ORAL_TABLET | Freq: Every evening | ORAL | 3 refills | Status: DC

## 2021-06-29 NOTE — Progress Notes (Signed)
Primary Physician/Referring:  Carlena Hurl, PA-C  Patient ID: Shane Valentine, male    DOB: [birth year], 55 y.o.   MRN: [id number]  Chief Complaint  Patient presents with   Follow-up   Results   HPI:    Shane Valentine  is a 55 y.o. African-American male patient with hypertension, hyperlipidemia, tobacco use disorder, CVA with left-sided hemiparesis in 2016, mildly abnormal distal anterior wall ischemia by nuclear perfusion study in 2015, moderate aortic regurgitation, asymptomatic mild bilateral carotid artery stenosis, referred to me for follow-up and management of peripheral artery disease, hypertension and hyperlipidemia.  Patient states that he has to frequently stop at his workplace, cannot climb ladders, as his left leg feels very weak and starts to cramp and feels heavy.  Symptoms have been ongoing for several years, has been worsening over the past few months.  In view of peripheral arterial disease, hypertension and hyperlipidemia and tobacco use disorder, underwent extensive evaluation including stress testing, echocardiogram and lower extremity arterial duplex along with carotid artery duplex and presents for follow-up.  He denies any chest pain, very minimal dyspnea which he attributes to smoking, no dizziness or palpitations or syncope. He is still smoking 1 pack of cigarettes a day.  Past Medical History:  Diagnosis Date   Arthritis    Genital warts    GERD (gastroesophageal reflux disease)    Headache    Hyperlipidemia    Hypertension    Paresthesia of hand, bilateral    Peripheral vascular disease ([place])    Smoker    Stroke [facility])    Tuberculosis 1995   26mo therapy, [place]   Past Surgical History:  Procedure Laterality Date   INGUINAL HERNIA REPAIR Left 03/29/2014   Procedure: LAPAROSCOPIC LEFT INGUINAL HERNIA REPAIR;  Surgeon: Ralene Ok, MD;  Location: [place];  Service: General;  Laterality: Left;   INSERTION OF MESH Left 03/29/2014   Procedure: INSERTION  OF MESH;  Surgeon: Ralene Ok, MD;  Location: [place];  Service: General;  Laterality: Left;   TUMOR EXCISION Bilateral 03/21/2020   Procedure: TUMOR EXCISION RECTAL, anal condyloma;  Surgeon: Ronny Bacon, MD;  Location: [facility];  Service: General;  Laterality: Bilateral;   Family History  Problem Relation Age of Onset   Diabetes Mother        amputations   Hypertension Mother    Cancer Father    COPD Brother    Cerebral aneurysm Brother    Heart disease Neg Hx    Stroke Neg Hx     Social History   Tobacco Use   Smoking status: Every Day    Packs/day: 0.50    Years: 23.00    Pack years: 11.50    Types: Cigarettes   Smokeless tobacco: Never  Substance Use Topics   Alcohol use: No   Marital Status: Single  ROS  Review of Systems  Cardiovascular:  Positive for claudication. Negative for chest pain, dyspnea on exertion and leg swelling.  Gastrointestinal:  Negative for melena.  Neurological:  Positive for paresthesias.  Objective  Blood pressure (!) 153/93, pulse 86, temperature 98.2 F (36.8 C), temperature source Temporal, resp. rate 17, height 5\' 8"  (1.727 m), weight 189 lb 9.6 oz (86 kg), SpO2 98 %. Body mass index is 28.83 kg/m.  Vitals with BMI 06/29/2021 05/22/2021 05/22/2021  Height 5\' 8"  - 5\' 8"   Weight 189 lbs 10 oz - 186 lbs  BMI 40.98 - 11.91  Systolic 478 295 621  Diastolic 93 308  104  Pulse 86 57 63     Physical Exam Neck:     Vascular: No carotid bruit or JVD.  Cardiovascular:     Rate and Rhythm: Normal rate and regular rhythm.     Pulses:          Radial pulses are 2+ on the right side and 2+ on the left side.       Femoral pulses are 2+ on the right side and 2+ on the left side.      Popliteal pulses are 2+ on the right side and 0 on the left side.       Dorsalis pedis pulses are 1+ on the right side and 0 on the left side.       Posterior tibial pulses are 1+ on the right side and 0 on the left side.     Heart sounds: Murmur heard.   High-pitched blowing decrescendo early diastolic murmur is present with a grade of 2/4 at the upper right sternal border.    No gallop.  Pulmonary:     Effort: Pulmonary effort is normal.     Breath sounds: Normal breath sounds.  Abdominal:     General: Bowel sounds are normal.     Palpations: Abdomen is soft.  Musculoskeletal:     Right lower leg: No edema.     Left lower leg: No edema.  Skin:    Capillary Refill: Capillary refill takes less than 2 seconds.     Laboratory examination:   Recent Labs    04/24/21 1301 05/22/21 1344  NA 141 142  K 4.6 4.0  CL 100 105  CO2 26 24  GLUCOSE 88 79  BUN 13 16  CREATININE 1.25 0.99  CALCIUM 9.3 9.2   CrCl cannot be calculated (Patient's most recent lab result is older than the maximum 21 days allowed.).  CMP Latest Ref Rng & Units 05/22/2021 04/24/2021 03/20/2020  Glucose 70 - 99 mg/dL 79 88 102(H)  BUN 6 - 24 mg/dL 16 13 23(H)  Creatinine 0.76 - 1.27 mg/dL 0.99 1.25 1.20  Sodium 134 - 144 mmol/L 142 141 140  Potassium 3.5 - 5.2 mmol/L 4.0 4.6 4.0  Chloride 96 - 106 mmol/L 105 100 105  CO2 20 - 29 mmol/L 24 26 26   Calcium 8.7 - 10.2 mg/dL 9.2 9.3 9.2  Total Protein 6.0 - 8.5 g/dL - 6.5 7.0  Total Bilirubin 0.0 - 1.2 mg/dL - 0.2 0.9  Alkaline Phos 44 - 121 IU/L - 99 69  AST 0 - 40 IU/L - 16 18  ALT 0 - 44 IU/L - 19 15   CBC Latest Ref Rng & Units 04/24/2021 03/20/2020 12/31/2019  WBC 3.4 - 10.8 x10E3/uL 8.6 8.8 7.6  Hemoglobin 13.0 - 17.7 g/dL 13.7 13.0 13.5  Hematocrit 37.5 - 51.0 % 42.5 40.4 41.6  Platelets 150 - 450 x10E3/uL 144(L) 204 134(L)    Lipid Panel Recent Labs    05/22/21 1344  CHOL 141  TRIG 71  LDLCALC 79  HDL 48   Lipid Panel     Component Value Date/Time   CHOL 141 05/22/2021 1344   TRIG 71 05/22/2021 1344   HDL 48 05/22/2021 1344   CHOLHDL 6.0 (H) 12/31/2019 0822   CHOLHDL 4.9 01/14/2015 0001   VLDL 25 01/14/2015 0001   LDLCALC 79 05/22/2021 1344   LABVLDL 14 05/22/2021 1344     HEMOGLOBIN  A1C Lab Results  Component Value Date   HGBA1C 5.5 08/29/2017  TSH No results for input(s): TSH in the last 8760 hours.  Medications and allergies  No Known Allergies   Medication prior to this encounter:   Outpatient Medications Prior to Visit  Medication Sig Dispense Refill   amLODipine (NORVASC) 5 MG tablet Take 1 tablet (5 mg total) by mouth every evening. 30 tablet 2   aspirin EC 325 MG tablet Take 1 tablet (325 mg total) by mouth daily. 90 tablet 0   lisinopril-hydrochlorothiazide (ZESTORETIC) 20-25 MG tablet Take 1 tablet by mouth once daily 90 tablet 0   metoprolol succinate (TOPROL-XL) 50 MG 24 hr tablet TAKE 1 TABLET BY MOUTH ONCE DAILY TAKE  WITH  OR  IMMEDIATELY  FOLLOWING  A  MEAL 90 tablet 3   rosuvastatin (CRESTOR) 20 MG tablet Take 1 tablet (20 mg total) by mouth daily. 90 tablet 3   spironolactone (ALDACTONE) 25 MG tablet Take 1 tablet (25 mg total) by mouth every morning. 30 tablet 2   No facility-administered medications prior to visit.     Medication list after today's encounter   Current Outpatient Medications  Medication Instructions   amLODipine (NORVASC) 5 mg, Oral, Every evening   aspirin EC 325 mg, Oral, Daily   lisinopril-hydrochlorothiazide (ZESTORETIC) 20-25 MG tablet Take 1 tablet by mouth once daily   metoprolol succinate (TOPROL-XL) 50 MG 24 hr tablet TAKE 1 TABLET BY MOUTH ONCE DAILY TAKE  WITH  OR  IMMEDIATELY  FOLLOWING  A  MEAL   rosuvastatin (CRESTOR) 20 mg, Oral, Daily   spironolactone (ALDACTONE) 25 mg, Oral, BH-each morning    Radiology:   No results found.  Cardiac Studies:   PCV ECHOCARDIOGRAM COMPLETE 06/03/2021  Narrative Echocardiogram 06/03/2021: Normal LV systolic function with visual EF 65-70%. Left ventricle cavity is normal in size. Mild left ventricular hypertrophy. Normal global wall motion. Normal diastolic filling pattern, normal LAP. Mild to moderate aortic regurgitation. Mild pulmonic regurgitation. The aortic  root is mildly dilated, 3mm. Proximal ascending aorta not well visualized. Compared to study 02/14/2018 no significant change. Prior dimension of the aortic root is unknown.    PCV MYOCARDIAL PERFUSION WO LEXISCAN 06/09/2021  Narrative Exercise nuclear stress test  06/08/2021: Normal ECG stress. The patient exercised for 6 minutes and 0 seconds of a Bruce protocol, achieving approximately 7.05 METs and 88% of MPHR. The heart rate response was normal. The blood pressure response was hypertensive. The baseline blood pressure was 150/110 mmHg and increased to 190/104 mmHg. Resting EKG/ECG demonstrated normal sinus rhythm. Non-specific T-wave abnormalities. Peak EKG/ECG revealed normalization of resting ST-T abnormality. Myocardial perfusion is normal. Overall LV systolic function is normal without regional wall motion abnormalities. Stress LV EF: 49%, visually appears normal. No previous exam available for comparison. Low risk.   Carotid artery duplex 06/03/2021:  Duplex suggests stenosis in the right internal carotid artery (1-15%). Duplex suggests stenosis in the left internal carotid artery (1-15%). There is mild diffuse homogeneous plaque noted throughout the CCA and ICA bilaterally.  Antegrade right vertebral artery flow. Antegrade left vertebral artery flow. Follow up studies if clinically indicted.  Lower Extremity Arterial Duplex 06/03/2021:  No hemodynamically significant stenoses are identified in the right lower extremity arterial system. Moderate velocity increase at the left distal superficial femoral artery suggests >50% stenosis. There is monophasic waveforms below the distal left SFA suggests hemodynamically significant stenosis. This exam reveals normal perfusion of the right lower extremity (ABI 0.99) and moderately decreased perfusion of the left lower extremity, noted at the post tibial artery level (  ABI 0.63).  Compared to the ABI done 01/25/2018, previously right ABI 0.94  and left ABI 0.98.   EKG:   EKG 05/22/2021: Normal sinus rhythm at the rate of 55 bpm, normal axis, nonspecific T abnormality.    Assessment     ICD-10-CM   1. Claudication in peripheral vascular disease (HCC)  I73.9     2. Pure hypercholesterolemia  E78.00     3. Aortic root dilatation (HCC)  I77.810     4. Moderate aortic regurgitation  I35.1        There are no discontinued medications.   No orders of the defined types were placed in this encounter.   No orders of the defined types were placed in this encounter.  Recommendations:   Shane Valentine is a 55 y.o. African-American male patient with hypertension, hyperlipidemia, tobacco use disorder, CVA with left-sided hemiparesis in 2016, mildly abnormal distal anterior wall ischemia by nuclear perfusion study in 2015, moderate aortic regurgitation, asymptomatic mild bilateral carotid artery stenosis, referred to me for follow-up and management of peripheral artery disease, hypertension and hyperlipidemia.  With regard to hypertension, uncontrolled, in view of moderate aortic regurgitation, On his last office visit I reduced the dose of metoprolol succinate from 100 mg to 50 mg and added amlodipine 5 mg and spironolactone 25 mg.  Blood pressure is still elevated, hence we will increase amlodipine to 10 mg and spironolactone to 50 mg daily, will obtain BMP and CBC in 2 to 3 weeks, will also schedule him for peripheral arteriogram and angioplasty.  Patient is extremely symptomatic with regard to claudication.  Reviewed the results of the lower extremity arterial duplex.  Patient wants to proceed, realizes there is less than 1% risk of death, need for urgent surgical revascularization, embolic complications, bleeding complications and renal failure.  Lipids are well controlled, we could optimize his lipids even further in view of PAD try to get the goal LDL to <55.  We will address this at a later date.  Smoking cessation again  discussed with the patient.  I will see him back for follow-up in 6 weeks.    Adrian Prows, MD, [facility] 06/29/2021, 4:08 PM Office: [phone]

## 2021-07-21 ENCOUNTER — Ambulatory Visit (HOSPITAL_COMMUNITY)
Admission: RE | Admit: 2021-07-21 | Discharge: 2021-07-21 | Disposition: A | Discharge status: Home / Self Care | Payer: BC Managed Care – PPO | Attending: Cardiology | Admitting: Cardiology

## 2021-07-21 ENCOUNTER — Encounter (HOSPITAL_COMMUNITY)
Admission: RE | Disposition: A | Discharge status: Home / Self Care | Payer: Self-pay | Source: Home / Self Care | Attending: Cardiology

## 2021-07-21 ENCOUNTER — Other Ambulatory Visit: Payer: Self-pay

## 2021-07-21 DIAGNOSIS — Z539 Procedure and treatment not carried out, unspecified reason: Secondary | ICD-10-CM | POA: Insufficient documentation

## 2021-07-21 DIAGNOSIS — I739 Peripheral vascular disease, unspecified: Secondary | ICD-10-CM

## 2021-07-21 LAB — CBC
HCT: 37.6 % — ABNORMAL LOW (ref 39.0–52.0)
Hemoglobin: 12.3 g/dL — ABNORMAL LOW (ref 13.0–17.0)
MCH: 31.3 pg (ref 26.0–34.0)
MCHC: 32.7 g/dL (ref 30.0–36.0)
MCV: 95.7 fL (ref 80.0–100.0)
Platelets: 213 10*3/uL (ref 150–400)
RBC: 3.93 MIL/uL — ABNORMAL LOW (ref 4.22–5.81)
RDW: 13.1 % (ref 11.5–15.5)
WBC: 8.1 10*3/uL (ref 4.0–10.5)
nRBC: 0 % (ref 0.0–0.2)

## 2021-07-21 LAB — BASIC METABOLIC PANEL
Anion gap: 7 (ref 5–15)
BUN: 20 mg/dL (ref 6–20)
CO2: 25 mmol/L (ref 22–32)
Calcium: 8.7 mg/dL — ABNORMAL LOW (ref 8.9–10.3)
Chloride: 105 mmol/L (ref 98–111)
Creatinine, Ser: 1.05 mg/dL (ref 0.61–1.24)
GFR, Estimated: >60 mL/min (ref >60)
Glucose, Bld: 96 mg/dL (ref 70–99)
Potassium: 5.3 mmol/L — ABNORMAL HIGH (ref 3.5–5.1)
Sodium: 137 mmol/L (ref 135–145)

## 2021-07-21 SURGERY — LOWER EXTREMITY ANGIOGRAPHY
Anesthesia: LOCAL

## 2021-07-21 MED ORDER — SODIUM CHLORIDE 0.9% FLUSH: 3.0000 mL | INTRAVENOUS | Status: DC | PRN

## 2021-07-21 MED ORDER — SODIUM CHLORIDE 0.9 % WEIGHT BASED INFUSION: 3.0000 mL/kg/h | INTRAVENOUS | Status: AC

## 2021-07-21 MED ORDER — SODIUM CHLORIDE 0.9 % WEIGHT BASED INFUSION: 1.0000 mL/kg/h | INTRAVENOUS | Status: DC

## 2021-07-21 MED ORDER — ASPIRIN 81 MG PO CHEW: 81.0000 mg | CHEWABLE_TABLET | ORAL | Status: DC

## 2021-07-21 MED ORDER — SODIUM CHLORIDE 0.9% FLUSH: 3.0000 mL | Freq: Two times a day (BID) | INTRAVENOUS | Status: DC

## 2021-07-21 MED ORDER — SODIUM CHLORIDE 0.9 % WEIGHT BASED INFUSION
3.0000 mL/kg/h | INTRAVENOUS | Status: DC
  Administered 2021-07-21: 3 mL/kg/h via INTRAVENOUS

## 2021-07-21 MED ORDER — SODIUM CHLORIDE 0.9 % IV SOLN: 250.0000 mL | INTRAVENOUS | Status: DC | PRN

## 2021-08-05 ENCOUNTER — Ambulatory Visit: Payer: BC Managed Care – PPO | Admitting: Cardiology

## 2021-08-11 ENCOUNTER — Encounter (HOSPITAL_COMMUNITY)
Admission: RE | Disposition: A | Discharge status: Home / Self Care | Payer: Self-pay | Source: Home / Self Care | Attending: Cardiology

## 2021-08-11 ENCOUNTER — Other Ambulatory Visit (HOSPITAL_COMMUNITY): Payer: Self-pay

## 2021-08-11 ENCOUNTER — Ambulatory Visit (HOSPITAL_COMMUNITY)
Admission: RE | Admit: 2021-08-11 | Discharge: 2021-08-11 | Disposition: A | Discharge status: Home / Self Care | Payer: BC Managed Care – PPO | Attending: Cardiology | Admitting: Cardiology

## 2021-08-11 ENCOUNTER — Other Ambulatory Visit: Payer: Self-pay

## 2021-08-11 DIAGNOSIS — I69354 Hemiplegia and hemiparesis following cerebral infarction affecting left non-dominant side: Secondary | ICD-10-CM | POA: Diagnosis not present

## 2021-08-11 DIAGNOSIS — I700 Atherosclerosis of aorta: Secondary | ICD-10-CM | POA: Diagnosis not present

## 2021-08-11 DIAGNOSIS — E785 Hyperlipidemia, unspecified: Secondary | ICD-10-CM | POA: Insufficient documentation

## 2021-08-11 DIAGNOSIS — F17210 Nicotine dependence, cigarettes, uncomplicated: Secondary | ICD-10-CM | POA: Diagnosis not present

## 2021-08-11 DIAGNOSIS — I70212 Atherosclerosis of native arteries of extremities with intermittent claudication, left leg: Secondary | ICD-10-CM | POA: Insufficient documentation

## 2021-08-11 DIAGNOSIS — I739 Peripheral vascular disease, unspecified: Secondary | ICD-10-CM | POA: Diagnosis present

## 2021-08-11 DIAGNOSIS — I6523 Occlusion and stenosis of bilateral carotid arteries: Secondary | ICD-10-CM | POA: Diagnosis not present

## 2021-08-11 DIAGNOSIS — I10 Essential (primary) hypertension: Secondary | ICD-10-CM | POA: Insufficient documentation

## 2021-08-11 HISTORY — PX: PERIPHERAL VASCULAR ATHERECTOMY: CATH118256

## 2021-08-11 HISTORY — PX: LOWER EXTREMITY ANGIOGRAPHY: CATH118251

## 2021-08-11 LAB — POCT ACTIVATED CLOTTING TIME
Activated Clotting Time: 305 seconds
Activated Clotting Time: 233 seconds
Activated Clotting Time: 335 seconds

## 2021-08-11 SURGERY — LOWER EXTREMITY ANGIOGRAPHY
Anesthesia: LOCAL

## 2021-08-11 MED ORDER — IODIXANOL 320 MG/ML IV SOLN
INTRAVENOUS | Status: DC | PRN
  Administered 2021-08-11: 13:00:00 150 mL via INTRA_ARTERIAL

## 2021-08-11 MED ORDER — LABETALOL HCL 5 MG/ML IV SOLN: 10.0000 mg | INTRAVENOUS | Status: DC | PRN

## 2021-08-11 MED ORDER — HEPARIN SODIUM (PORCINE) 1000 UNIT/ML IJ SOLN
INTRAMUSCULAR | Status: AC
  Filled 2021-08-11: qty 10

## 2021-08-11 MED ORDER — CLOPIDOGREL BISULFATE 75 MG PO TABS
75.0000 mg | ORAL_TABLET | Freq: Every day | ORAL | 11 refills | Status: DC
  Filled 2021-08-11: qty 30, 30d supply, fill #0

## 2021-08-11 MED ORDER — CLOPIDOGREL BISULFATE 300 MG PO TABS
ORAL_TABLET | ORAL | Status: DC | PRN
  Administered 2021-08-11: 300 mg via ORAL

## 2021-08-11 MED ORDER — NITROGLYCERIN 1 MG/10 ML FOR IR/CATH LAB
INTRA_ARTERIAL | Status: AC
  Filled 2021-08-11: qty 10

## 2021-08-11 MED ORDER — CLOPIDOGREL BISULFATE 300 MG PO TABS
ORAL_TABLET | ORAL | Status: AC
  Filled 2021-08-11: qty 1

## 2021-08-11 MED ORDER — SODIUM CHLORIDE 0.9 % WEIGHT BASED INFUSION: 1.0000 mL/kg/h | INTRAVENOUS | Status: DC

## 2021-08-11 MED ORDER — SODIUM CHLORIDE 0.9% FLUSH: 3.0000 mL | INTRAVENOUS | Status: DC | PRN

## 2021-08-11 MED ORDER — MIDAZOLAM HCL 2 MG/2ML IJ SOLN
INTRAMUSCULAR | Status: AC
  Filled 2021-08-11: qty 2

## 2021-08-11 MED ORDER — HEPARIN SODIUM (PORCINE) 1000 UNIT/ML IJ SOLN
INTRAMUSCULAR | Status: DC | PRN
  Administered 2021-08-11: 3000 [IU] via INTRAVENOUS
  Administered 2021-08-11: 8000 [IU] via INTRAVENOUS
  Administered 2021-08-11: 3000 [IU] via INTRAVENOUS

## 2021-08-11 MED ORDER — HYDRALAZINE HCL 20 MG/ML IJ SOLN: 5.0000 mg | INTRAMUSCULAR | Status: DC | PRN

## 2021-08-11 MED ORDER — CLOPIDOGREL BISULFATE 75 MG PO TABS: 75.0000 mg | ORAL_TABLET | Freq: Every day | ORAL | Status: DC

## 2021-08-11 MED ORDER — LIDOCAINE HCL (PF) 1 % IJ SOLN
INTRAMUSCULAR | Status: DC | PRN
  Administered 2021-08-11: 15 mL

## 2021-08-11 MED ORDER — ONDANSETRON HCL 4 MG/2ML IJ SOLN: 4.0000 mg | Freq: Four times a day (QID) | INTRAMUSCULAR | Status: DC | PRN

## 2021-08-11 MED ORDER — FENTANYL CITRATE (PF) 100 MCG/2ML IJ SOLN
INTRAMUSCULAR | Status: AC
  Filled 2021-08-11: qty 2

## 2021-08-11 MED ORDER — LIDOCAINE HCL (PF) 1 % IJ SOLN
INTRAMUSCULAR | Status: AC
  Filled 2021-08-11: qty 30

## 2021-08-11 MED ORDER — SODIUM CHLORIDE 0.9% FLUSH: 3.0000 mL | Freq: Two times a day (BID) | INTRAVENOUS | Status: DC

## 2021-08-11 MED ORDER — SODIUM CHLORIDE 0.9 % IV SOLN: 250.0000 mL | INTRAVENOUS | Status: DC | PRN

## 2021-08-11 MED ORDER — SODIUM CHLORIDE 0.9 % IV SOLN: INTRAVENOUS | Status: AC

## 2021-08-11 MED ORDER — HEPARIN (PORCINE) IN NACL 1000-0.9 UT/500ML-% IV SOLN
INTRAVENOUS | Status: DC | PRN
  Administered 2021-08-11 (×2): 500 mL

## 2021-08-11 MED ORDER — MIDAZOLAM HCL 2 MG/2ML IJ SOLN
INTRAMUSCULAR | Status: DC | PRN
  Administered 2021-08-11: 1 mg via INTRAVENOUS
  Administered 2021-08-11: 2 mg via INTRAVENOUS

## 2021-08-11 MED ORDER — FENTANYL CITRATE (PF) 100 MCG/2ML IJ SOLN
INTRAMUSCULAR | Status: DC | PRN
  Administered 2021-08-11 (×2): 25 ug via INTRAVENOUS
  Administered 2021-08-11 (×2): 50 ug via INTRAVENOUS

## 2021-08-11 MED ORDER — ASPIRIN 81 MG PO TBEC
81.0000 mg | DELAYED_RELEASE_TABLET | Freq: Every day | ORAL | 2 refills | Status: DC
  Filled 2021-08-11: qty 30, 30d supply, fill #0

## 2021-08-11 MED ORDER — ACETAMINOPHEN 325 MG PO TABS: 650.0000 mg | ORAL_TABLET | ORAL | Status: DC | PRN

## 2021-08-11 MED ORDER — HEPARIN (PORCINE) IN NACL 1000-0.9 UT/500ML-% IV SOLN
INTRAVENOUS | Status: AC
  Filled 2021-08-11: qty 1000

## 2021-08-11 MED ORDER — SODIUM CHLORIDE 0.9 % WEIGHT BASED INFUSION
3.0000 mL/kg/h | INTRAVENOUS | Status: AC
  Administered 2021-08-11: 10:00:00 3 mL/kg/h via INTRAVENOUS

## 2021-08-11 SURGICAL SUPPLY — 29 items
BALLN COYOTE OTW 2.5X100X150 (BALLOONS) ×4
BALLOON COYOTE OTW 2.5X100X150 (BALLOONS) IMPLANT
CATH ANGIO 5F PIGTAIL 65CM (CATHETERS) ×2 IMPLANT
CATH CROSS OVER TEMPO 5F (CATHETERS) ×2 IMPLANT
CATH CXI 2.3F 90 ANG (CATHETERS) ×2 IMPLANT
CATH HAWKONE LX EXTENDED TIP (CATHETERS) ×2 IMPLANT
CATH SOFT-VU ST 4F 90CM (CATHETERS) ×2 IMPLANT
CATH STRAIGHT 5FR 65CM (CATHETERS) ×2 IMPLANT
CLOSURE PERCLOSE PROSTYLE (VASCULAR PRODUCTS) ×2 IMPLANT
DCB RANGER 6.0X200 150 (BALLOONS) IMPLANT
DEVICE SPIDERFX EMB PROT 6MM (WIRE) ×2 IMPLANT
GLIDEWIRE ADV .035X260CM (WIRE) ×2 IMPLANT
GUIDEWIRE ASTATO XS 20G 300CM (WIRE) ×2 IMPLANT
KIT ENCORE 26 ADVANTAGE (KITS) ×2 IMPLANT
KIT MICROPUNCTURE NIT STIFF (SHEATH) ×2 IMPLANT
KIT PV (KITS) ×4 IMPLANT
RANGER DCB 6.0X200 150 (BALLOONS) ×4
SHEATH PINNACLE 5F 10CM (SHEATH) ×2 IMPLANT
SHEATH PINNACLE ST 6F 45CM (SHEATH) ×2 IMPLANT
SHEATH PINNACLE ST 7F 45CM (SHEATH) ×2 IMPLANT
SHEATH PROBE COVER 6X72 (BAG) ×2 IMPLANT
SYR MEDRAD MARK 7 150ML (SYRINGE) ×4 IMPLANT
TAPE SHOOT N SEE (TAPE) ×2 IMPLANT
TRANSDUCER W/STOPCOCK (MISCELLANEOUS) ×4 IMPLANT
TRAY PV CATH (CUSTOM PROCEDURE TRAY) ×4 IMPLANT
WIRE G V18X300CM (WIRE) ×2 IMPLANT
WIRE HITORQ VERSACORE ST 145CM (WIRE) ×2 IMPLANT
WIRE SPARTACORE .014X190CM (WIRE) ×2 IMPLANT
WIRE SPARTACORE .014X300CM (WIRE) ×2 IMPLANT

## 2021-08-11 NOTE — Interval H&P Note (Signed)
History and Physical Interval Note:  08/11/2021 9:53 AM  Shane Valentine  has presented today for surgery, with the diagnosis of claudication, uncontrolled hypertension.  The various methods of treatment have been discussed with the patient and family. After consideration of risks, benefits and other options for treatment, the patient has consented to  Procedure(s): LOWER EXTREMITY ANGIOGRAPHY (N/A) and possible angioplasty as a surgical intervention.  The patient's history has been reviewed, patient examined, no change in status, stable for surgery.  I have reviewed the patient's chart and labs.  Questions were answered to the patient's satisfaction.     Adrian Prows

## 2021-08-11 NOTE — H&P (View-Only) (Signed)
Primary Physician/Referring:  Carlena Hurl, PA-C  Patient ID: Shane Valentine, male    DOB: [birth year], 56 y.o.   MRN: [id number]  Claudication of the lower extremity  HPI:    Shane Valentine  is a 56 y.o. African-American male patient with hypertension, hyperlipidemia, tobacco use disorder, CVA with left-sided hemiparesis in 2016, mildly abnormal distal anterior wall ischemia by nuclear perfusion study in 2015, moderate aortic regurgitation, asymptomatic mild bilateral carotid artery stenosis, PAD, hypertension and hyperlipidemia.  Patient states that he has to frequently stop at his workplace, cannot climb ladders, as his left leg feels very weak and starts to cramp and feels heavy.  Symptoms have been ongoing for several years, has been worsening over the past few months.   He denies any chest pain, very minimal dyspnea which he attributes to smoking, no dizziness or palpitations or syncope. He is still smoking 1 pack of cigarettes a day.  He was scheduled for peripheral arteriogram about 2 to 3 weeks ago, however due to hospital schedule and Cath Lab being extremely busy he is now rescheduled for peripheral arteriogram.  No new symptoms, no ulceration, no rest pain.  Past Medical History:  Diagnosis Date   Arthritis    Genital warts    GERD (gastroesophageal reflux disease)    Headache    Hyperlipidemia    Hypertension    Paresthesia of hand, bilateral    Peripheral vascular disease ([place])    Smoker    Stroke [facility])    Tuberculosis 1995   29mo therapy, [place]   Past Surgical History:  Procedure Laterality Date   INGUINAL HERNIA REPAIR Left 03/29/2014   Procedure: LAPAROSCOPIC LEFT INGUINAL HERNIA REPAIR;  Surgeon: Ralene Ok, MD;  Location: [place];  Service: General;  Laterality: Left;   INSERTION OF MESH Left 03/29/2014   Procedure: INSERTION OF MESH;  Surgeon: Ralene Ok, MD;  Location: [place];  Service: General;  Laterality: Left;   TUMOR EXCISION Bilateral 03/21/2020    Procedure: TUMOR EXCISION RECTAL, anal condyloma;  Surgeon: Ronny Bacon, MD;  Location: [facility];  Service: General;  Laterality: Bilateral;   Family History  Problem Relation Age of Onset   Diabetes Mother        amputations   Hypertension Mother    Cancer Father    COPD Brother    Cerebral aneurysm Brother    Heart disease Neg Hx    Stroke Neg Hx     Social History   Tobacco Use   Smoking status: Every Day    Packs/day: 0.50    Years: 23.00    Pack years: 11.50    Types: Cigarettes   Smokeless tobacco: Never  Substance Use Topics   Alcohol use: No   Marital Status: Single  ROS  Review of Systems  Cardiovascular:  Positive for claudication. Negative for chest pain, dyspnea on exertion and leg swelling.  Gastrointestinal:  Negative for melena.  Neurological:  Positive for paresthesias.  Objective  Blood pressure (!) 149/96, pulse 61, temperature 98 F (36.7 C), temperature source Oral, resp. rate 18, height 5\' 9"  (1.753 m), weight 86.2 kg, SpO2 95 %. Body mass index is 28.06 kg/m.  Vitals with BMI 08/11/2021 07/21/2021 07/21/2021  Height 5\' 9"  - 5\' 8"   Weight 190 lbs - 187 lbs  BMI 78.29 - 56.21  Systolic 308 657 -  Diastolic 96 95 -  Pulse 61 61 -     Physical Exam Neck:     Vascular:  No carotid bruit or JVD.  Cardiovascular:     Rate and Rhythm: Normal rate and regular rhythm.     Pulses:          Radial pulses are 2+ on the right side and 2+ on the left side.       Femoral pulses are 2+ on the right side and 2+ on the left side.      Popliteal pulses are 2+ on the right side and 0 on the left side.       Dorsalis pedis pulses are 1+ on the right side and 0 on the left side.       Posterior tibial pulses are 1+ on the right side and 0 on the left side.     Heart sounds: Murmur heard.  High-pitched blowing decrescendo early diastolic murmur is present with a grade of 2/4 at the upper right sternal border.    No gallop.  Pulmonary:     Effort: Pulmonary  effort is normal.     Breath sounds: Normal breath sounds.  Abdominal:     General: Bowel sounds are normal.     Palpations: Abdomen is soft.  Musculoskeletal:     Right lower leg: No edema.     Left lower leg: No edema.  Skin:    Capillary Refill: Capillary refill takes less than 2 seconds.     Laboratory examination:   Recent Labs    04/24/21 1301 05/22/21 1344 07/21/21 0738  NA 141 142 137  K 4.6 4.0 5.3*  CL 100 105 105  CO2 26 24 25   GLUCOSE 88 79 96  BUN 13 16 20   CREATININE 1.25 0.99 1.05  CALCIUM 9.3 9.2 8.7*  GFRNONAA  --   --  >60   CrCl cannot be calculated (Patient's most recent lab result is older than the maximum 21 days allowed.).  CMP Latest Ref Rng & Units 07/21/2021 05/22/2021 04/24/2021  Glucose 70 - 99 mg/dL 96 79 88  BUN 6 - 20 mg/dL 20 16 13   Creatinine 0.61 - 1.24 mg/dL 1.05 0.99 1.25  Sodium 135 - 145 mmol/L 137 142 141  Potassium 3.5 - 5.1 mmol/L 5.3(H) 4.0 4.6  Chloride 98 - 111 mmol/L 105 105 100  CO2 22 - 32 mmol/L 25 24 26   Calcium 8.9 - 10.3 mg/dL 8.7(L) 9.2 9.3  Total Protein 6.0 - 8.5 g/dL - - 6.5  Total Bilirubin 0.0 - 1.2 mg/dL - - 0.2  Alkaline Phos 44 - 121 IU/L - - 99  AST 0 - 40 IU/L - - 16  ALT 0 - 44 IU/L - - 19   CBC Latest Ref Rng & Units 07/21/2021 04/24/2021 03/20/2020  WBC 4.0 - 10.5 K/uL 8.1 8.6 8.8  Hemoglobin 13.0 - 17.0 g/dL 12.3(L) 13.7 13.0  Hematocrit 39.0 - 52.0 % 37.6(L) 42.5 40.4  Platelets 150 - 400 K/uL 213 144(L) 204    Lipid Panel Recent Labs    05/22/21 1344  CHOL 141  TRIG 71  LDLCALC 79  HDL 48   Lipid Panel     Component Value Date/Time   CHOL 141 05/22/2021 1344   TRIG 71 05/22/2021 1344   HDL 48 05/22/2021 1344   CHOLHDL 6.0 (H) 12/31/2019 0822   CHOLHDL 4.9 01/14/2015 0001   VLDL 25 01/14/2015 0001   LDLCALC 79 05/22/2021 1344   LABVLDL 14 05/22/2021 1344     HEMOGLOBIN A1C Lab Results  Component Value Date   HGBA1C 5.5 08/29/2017  TSH No results for input(s): TSH in the last 8760  hours.  Medications and allergies  No Known Allergies   Medication prior to this encounter:   (Not in an outpatient encounter)    Medication list after today's encounter   Current Outpatient Medications  Medication Instructions   amLODipine (NORVASC) 10 mg, Oral, Every evening   amLODipine (NORVASC) 5 mg, Oral, Every evening   aspirin EC 325 mg, Oral, Daily   diclofenac Sodium (VOLTAREN) 1 % GEL 1 application, Topical, 4 times daily PRN   lisinopril-hydrochlorothiazide (ZESTORETIC) 20-25 MG tablet Take 1 tablet by mouth once daily   metoprolol succinate (TOPROL-XL) 50 mg, Oral, Daily, Take with or immediately following a meal.   rosuvastatin (CRESTOR) 20 mg, Oral, Daily   spironolactone (ALDACTONE) 50 mg, Oral, BH-each morning   spironolactone (ALDACTONE) 25 mg, Oral, Every morning    Radiology:   No results found.  Cardiac Studies:   PCV ECHOCARDIOGRAM COMPLETE 06/03/2021  Narrative Echocardiogram 06/03/2021: Normal LV systolic function with visual EF 65-70%. Left ventricle cavity is normal in size. Mild left ventricular hypertrophy. Normal global wall motion. Normal diastolic filling pattern, normal LAP. Mild to moderate aortic regurgitation. Mild pulmonic regurgitation. The aortic root is mildly dilated, 82mm. Proximal ascending aorta not well visualized. Compared to study 02/14/2018 no significant change. Prior dimension of the aortic root is unknown.    PCV MYOCARDIAL PERFUSION WO LEXISCAN 06/09/2021  Narrative Exercise nuclear stress test  06/08/2021: Normal ECG stress. The patient exercised for 6 minutes and 0 seconds of a Bruce protocol, achieving approximately 7.05 METs and 88% of MPHR. The heart rate response was normal. The blood pressure response was hypertensive. The baseline blood pressure was 150/110 mmHg and increased to 190/104 mmHg. Resting EKG/ECG demonstrated normal sinus rhythm. Non-specific T-wave abnormalities. Peak EKG/ECG revealed normalization  of resting ST-T abnormality. Myocardial perfusion is normal. Overall LV systolic function is normal without regional wall motion abnormalities. Stress LV EF: 49%, visually appears normal. No previous exam available for comparison. Low risk.   Carotid artery duplex 06/03/2021:  Duplex suggests stenosis in the right internal carotid artery (1-15%). Duplex suggests stenosis in the left internal carotid artery (1-15%). There is mild diffuse homogeneous plaque noted throughout the CCA and ICA bilaterally.  Antegrade right vertebral artery flow. Antegrade left vertebral artery flow. Follow up studies if clinically indicted.  Lower Extremity Arterial Duplex 06/03/2021:  No hemodynamically significant stenoses are identified in the right lower extremity arterial system. Moderate velocity increase at the left distal superficial femoral artery suggests >50% stenosis. There is monophasic waveforms below the distal left SFA suggests hemodynamically significant stenosis. This exam reveals normal perfusion of the right lower extremity (ABI 0.99) and moderately decreased perfusion of the left lower extremity, noted at the post tibial artery level (ABI 0.63).  Compared to the ABI done 01/25/2018, previously right ABI 0.94 and left ABI 0.98.   EKG:   EKG 05/22/2021: Normal sinus rhythm at the rate of 55 bpm, normal axis, nonspecific T abnormality.    Assessment   1.  Peripheral arterial disease and claudication peripheral arterial disease involving left worse than the right lower extremity with abnormal lower extremity arterial duplex and ABI.  Recommendations:   Shane Valentine is a 56 y.o. African-American male patient with hypertension, hyperlipidemia, tobacco use disorder, CVA with left-sided hemiparesis in 2016, mildly abnormal distal anterior wall ischemia by nuclear perfusion study in 2015, moderate aortic regurgitation, asymptomatic mild bilateral carotid artery stenosis,  peripheral artery disease,  hypertension and hyperlipidemia.  Patient is extremely symptomatic with regard to claudication.  No change in symptoms, no schedule for peripheral arteriogram, patient aware of less than 1% risk of death, need for urgent surgical revascularization, embolic complications, bleeding complications and renal failure.    Adrian Prows, MD, [facility] 08/11/2021, 9:06 AM Office: [phone]

## 2021-08-11 NOTE — Progress Notes (Signed)
Primary Physician/Referring:  Carlena Hurl, PA-C  Patient ID: [name], male    DOB: [birth year], 56 y.o.   MRN: [id number]  Claudication of the lower extremity  HPI:    PETR BONTEMPO  is a 56 y.o. African-American male patient with hypertension, hyperlipidemia, tobacco use disorder, CVA with left-sided hemiparesis in 2016, mildly abnormal distal anterior wall ischemia by nuclear perfusion study in 2015, moderate aortic regurgitation, asymptomatic mild bilateral carotid artery stenosis, PAD, hypertension and hyperlipidemia.  Patient states that he has to frequently stop at his workplace, cannot climb ladders, as his left leg feels very weak and starts to cramp and feels heavy.  Symptoms have been ongoing for several years, has been worsening over the past few months.   He denies any chest pain, very minimal dyspnea which he attributes to smoking, no dizziness or palpitations or syncope. He is still smoking 1 pack of cigarettes a day.  He was scheduled for peripheral arteriogram about 2 to 3 weeks ago, however due to hospital schedule and Cath Lab being extremely busy he is now rescheduled for peripheral arteriogram.  No new symptoms, no ulceration, no rest pain.  Past Medical History:  Diagnosis Date   Arthritis    Genital warts    GERD (gastroesophageal reflux disease)    Headache    Hyperlipidemia    Hypertension    Paresthesia of hand, bilateral    Peripheral vascular disease ([place])    Smoker    Stroke [facility])    Tuberculosis 1995   52mo therapy, [place]   Past Surgical History:  Procedure Laterality Date   INGUINAL HERNIA REPAIR Left 03/29/2014   Procedure: LAPAROSCOPIC LEFT INGUINAL HERNIA REPAIR;  Surgeon: Ralene Ok, MD;  Location: [place];  Service: General;  Laterality: Left;   INSERTION OF MESH Left 03/29/2014   Procedure: INSERTION OF MESH;  Surgeon: Ralene Ok, MD;  Location: [place];  Service: General;  Laterality: Left;   TUMOR EXCISION Bilateral 03/21/2020    Procedure: TUMOR EXCISION RECTAL, anal condyloma;  Surgeon: Ronny Bacon, MD;  Location: [facility];  Service: General;  Laterality: Bilateral;   Family History  Problem Relation Age of Onset   Diabetes Mother        amputations   Hypertension Mother    Cancer Father    COPD Brother    Cerebral aneurysm Brother    Heart disease Neg Hx    Stroke Neg Hx     Social History   Tobacco Use   Smoking status: Every Day    Packs/day: 0.50    Years: 23.00    Pack years: 11.50    Types: Cigarettes   Smokeless tobacco: Never  Substance Use Topics   Alcohol use: No   Marital Status: Single  ROS  Review of Systems  Cardiovascular:  Positive for claudication. Negative for chest pain, dyspnea on exertion and leg swelling.  Gastrointestinal:  Negative for melena.  Neurological:  Positive for paresthesias.  Objective  Blood pressure (!) 149/96, pulse 61, temperature 98 F (36.7 C), temperature source Oral, resp. rate 18, height 5\' 9"  (1.753 m), weight 86.2 kg, SpO2 95 %. Body mass index is 28.06 kg/m.  Vitals with BMI 08/11/2021 07/21/2021 07/21/2021  Height 5\' 9"  - 5\' 8"   Weight 190 lbs - 187 lbs  BMI 66.06 - 30.16  Systolic 010 932 -  Diastolic 96 95 -  Pulse 61 61 -     Physical Exam Neck:     Vascular:  No carotid bruit or JVD.  Cardiovascular:     Rate and Rhythm: Normal rate and regular rhythm.     Pulses:          Radial pulses are 2+ on the right side and 2+ on the left side.       Femoral pulses are 2+ on the right side and 2+ on the left side.      Popliteal pulses are 2+ on the right side and 0 on the left side.       Dorsalis pedis pulses are 1+ on the right side and 0 on the left side.       Posterior tibial pulses are 1+ on the right side and 0 on the left side.     Heart sounds: Murmur heard.  High-pitched blowing decrescendo early diastolic murmur is present with a grade of 2/4 at the upper right sternal border.    No gallop.  Pulmonary:     Effort: Pulmonary  effort is normal.     Breath sounds: Normal breath sounds.  Abdominal:     General: Bowel sounds are normal.     Palpations: Abdomen is soft.  Musculoskeletal:     Right lower leg: No edema.     Left lower leg: No edema.  Skin:    Capillary Refill: Capillary refill takes less than 2 seconds.     Laboratory examination:   Recent Labs    04/24/21 1301 05/22/21 1344 07/21/21 0738  NA 141 142 137  K 4.6 4.0 5.3*  CL 100 105 105  CO2 26 24 25   GLUCOSE 88 79 96  BUN 13 16 20   CREATININE 1.25 0.99 1.05  CALCIUM 9.3 9.2 8.7*  GFRNONAA  --   --  >60   CrCl cannot be calculated (Patient's most recent lab result is older than the maximum 21 days allowed.).  CMP Latest Ref Rng & Units 07/21/2021 05/22/2021 04/24/2021  Glucose 70 - 99 mg/dL 96 79 88  BUN 6 - 20 mg/dL 20 16 13   Creatinine 0.61 - 1.24 mg/dL 1.05 0.99 1.25  Sodium 135 - 145 mmol/L 137 142 141  Potassium 3.5 - 5.1 mmol/L 5.3(H) 4.0 4.6  Chloride 98 - 111 mmol/L 105 105 100  CO2 22 - 32 mmol/L 25 24 26   Calcium 8.9 - 10.3 mg/dL 8.7(L) 9.2 9.3  Total Protein 6.0 - 8.5 g/dL - - 6.5  Total Bilirubin 0.0 - 1.2 mg/dL - - 0.2  Alkaline Phos 44 - 121 IU/L - - 99  AST 0 - 40 IU/L - - 16  ALT 0 - 44 IU/L - - 19   CBC Latest Ref Rng & Units 07/21/2021 04/24/2021 03/20/2020  WBC 4.0 - 10.5 K/uL 8.1 8.6 8.8  Hemoglobin 13.0 - 17.0 g/dL 12.3(L) 13.7 13.0  Hematocrit 39.0 - 52.0 % 37.6(L) 42.5 40.4  Platelets 150 - 400 K/uL 213 144(L) 204    Lipid Panel Recent Labs    05/22/21 1344  CHOL 141  TRIG 71  LDLCALC 79  HDL 48   Lipid Panel     Component Value Date/Time   CHOL 141 05/22/2021 1344   TRIG 71 05/22/2021 1344   HDL 48 05/22/2021 1344   CHOLHDL 6.0 (H) 12/31/2019 0822   CHOLHDL 4.9 01/14/2015 0001   VLDL 25 01/14/2015 0001   LDLCALC 79 05/22/2021 1344   LABVLDL 14 05/22/2021 1344     HEMOGLOBIN A1C Lab Results  Component Value Date   HGBA1C 5.5 08/29/2017  TSH No results for input(s): TSH in the last 8760  hours.  Medications and allergies  No Known Allergies   Medication prior to this encounter:   (Not in an outpatient encounter)    Medication list after today's encounter   Current Outpatient Medications  Medication Instructions   amLODipine (NORVASC) 10 mg, Oral, Every evening   amLODipine (NORVASC) 5 mg, Oral, Every evening   aspirin EC 325 mg, Oral, Daily   diclofenac Sodium (VOLTAREN) 1 % GEL 1 application, Topical, 4 times daily PRN   lisinopril-hydrochlorothiazide (ZESTORETIC) 20-25 MG tablet Take 1 tablet by mouth once daily   metoprolol succinate (TOPROL-XL) 50 mg, Oral, Daily, Take with or immediately following a meal.   rosuvastatin (CRESTOR) 20 mg, Oral, Daily   spironolactone (ALDACTONE) 50 mg, Oral, BH-each morning   spironolactone (ALDACTONE) 25 mg, Oral, Every morning    Radiology:   No results found.  Cardiac Studies:   PCV ECHOCARDIOGRAM COMPLETE 06/03/2021  Narrative Echocardiogram 06/03/2021: Normal LV systolic function with visual EF 65-70%. Left ventricle cavity is normal in size. Mild left ventricular hypertrophy. Normal global wall motion. Normal diastolic filling pattern, normal LAP. Mild to moderate aortic regurgitation. Mild pulmonic regurgitation. The aortic root is mildly dilated, 76mm. Proximal ascending aorta not well visualized. Compared to study 02/14/2018 no significant change. Prior dimension of the aortic root is unknown.    PCV MYOCARDIAL PERFUSION WO LEXISCAN 06/09/2021  Narrative Exercise nuclear stress test  06/08/2021: Normal ECG stress. The patient exercised for 6 minutes and 0 seconds of a Bruce protocol, achieving approximately 7.05 METs and 88% of MPHR. The heart rate response was normal. The blood pressure response was hypertensive. The baseline blood pressure was 150/110 mmHg and increased to 190/104 mmHg. Resting EKG/ECG demonstrated normal sinus rhythm. Non-specific T-wave abnormalities. Peak EKG/ECG revealed normalization  of resting ST-T abnormality. Myocardial perfusion is normal. Overall LV systolic function is normal without regional wall motion abnormalities. Stress LV EF: 49%, visually appears normal. No previous exam available for comparison. Low risk.   Carotid artery duplex 06/03/2021:  Duplex suggests stenosis in the right internal carotid artery (1-15%). Duplex suggests stenosis in the left internal carotid artery (1-15%). There is mild diffuse homogeneous plaque noted throughout the CCA and ICA bilaterally.  Antegrade right vertebral artery flow. Antegrade left vertebral artery flow. Follow up studies if clinically indicted.  Lower Extremity Arterial Duplex 06/03/2021:  No hemodynamically significant stenoses are identified in the right lower extremity arterial system. Moderate velocity increase at the left distal superficial femoral artery suggests >50% stenosis. There is monophasic waveforms below the distal left SFA suggests hemodynamically significant stenosis. This exam reveals normal perfusion of the right lower extremity (ABI 0.99) and moderately decreased perfusion of the left lower extremity, noted at the post tibial artery level (ABI 0.63).  Compared to the ABI done 01/25/2018, previously right ABI 0.94 and left ABI 0.98.   EKG:   EKG 05/22/2021: Normal sinus rhythm at the rate of 55 bpm, normal axis, nonspecific T abnormality.    Assessment   1.  Peripheral arterial disease and claudication peripheral arterial disease involving left worse than the right lower extremity with abnormal lower extremity arterial duplex and ABI.  Recommendations:   RAYON MCCHRISTIAN is a 56 y.o. African-American male patient with hypertension, hyperlipidemia, tobacco use disorder, CVA with left-sided hemiparesis in 2016, mildly abnormal distal anterior wall ischemia by nuclear perfusion study in 2015, moderate aortic regurgitation, asymptomatic mild bilateral carotid artery stenosis,  peripheral artery disease,  hypertension and hyperlipidemia.  Patient is extremely symptomatic with regard to claudication.  No change in symptoms, no schedule for peripheral arteriogram, patient aware of less than 1% risk of death, need for urgent surgical revascularization, embolic complications, bleeding complications and renal failure.    Adrian Prows, MD, [facility] 08/11/2021, 9:06 AM Office: [phone]

## 2021-08-12 ENCOUNTER — Encounter (HOSPITAL_COMMUNITY): Payer: Self-pay | Admitting: Cardiology

## 2021-08-19 ENCOUNTER — Other Ambulatory Visit: Payer: Self-pay

## 2021-08-19 ENCOUNTER — Ambulatory Visit: Payer: BC Managed Care – PPO | Admitting: Cardiology

## 2021-08-19 ENCOUNTER — Encounter: Payer: Self-pay | Admitting: Cardiology

## 2021-08-19 VITALS — BP 130/78 | HR 87 | Temp 98.1°F | Resp 17 | Ht 69.0 in | Wt 176.2 lb

## 2021-08-19 DIAGNOSIS — F17200 Nicotine dependence, unspecified, uncomplicated: Secondary | ICD-10-CM

## 2021-08-19 DIAGNOSIS — E7800 Pure hypercholesterolemia, unspecified: Secondary | ICD-10-CM | POA: Insufficient documentation

## 2021-08-19 DIAGNOSIS — I10 Essential (primary) hypertension: Secondary | ICD-10-CM

## 2021-08-19 DIAGNOSIS — I739 Peripheral vascular disease, unspecified: Secondary | ICD-10-CM | POA: Diagnosis not present

## 2021-08-19 MED ORDER — CLOPIDOGREL BISULFATE 75 MG PO TABS: 75.0000 mg | ORAL_TABLET | Freq: Every day | ORAL | 1 refills | Status: AC

## 2021-08-19 MED ORDER — NICOTINE 14 MG/24HR TD PT24: 14.0000 mg | MEDICATED_PATCH | Freq: Every day | TRANSDERMAL | 1 refills | Status: DC

## 2021-08-19 NOTE — Progress Notes (Signed)
Primary Physician/Referring:  Carlena Hurl, PA-C  Patient ID: Shane Valentine, male    DOB: Apr 16, 1966, 56 y.o.   MRN: [id number]  Chief Complaint  Patient presents with   POST CATH   Follow-up   HPI:    Shane Valentine  is a 56 y.o. African-American male patient with hypertension, hyperlipidemia, tobacco use disorder, CVA with left-sided hemiparesis in 2016, mildly abnormal distal anterior wall ischemia by nuclear perfusion study in 2015, moderate aortic regurgitation, peripheral artery disease, hypertension and hyperlipidemia.  He underwent successful atherectomy followed by drug-coated balloon angioplasty to left SFA tandem proximal and mid stenosis.  Patient has noticed marked improvement in symptoms of claudication.  Essentially asymptomatic.  He is still smoking about 1/2 pack or slightly more cigarettes a day.  Having difficulty quitting smoking.  Past Medical History:  Diagnosis Date   Arthritis    Genital warts    GERD (gastroesophageal reflux disease)    Headache    Hyperlipidemia    Hypertension    Paresthesia of hand, bilateral    Peripheral vascular disease ([place])    Smoker    Stroke [place])    Tuberculosis 1995   73mo therapy, [place]   Past Surgical History:  Procedure Laterality Date   INGUINAL HERNIA REPAIR Left 03/29/2014   Procedure: LAPAROSCOPIC LEFT INGUINAL HERNIA REPAIR;  Surgeon: Ralene Ok, MD;  Location: [place];  Service: General;  Laterality: Left;   INSERTION OF MESH Left 03/29/2014   Procedure: INSERTION OF MESH;  Surgeon: Ralene Ok, MD;  Location: [place];  Service: General;  Laterality: Left;   LOWER EXTREMITY ANGIOGRAPHY N/A 08/11/2021   Procedure: LOWER EXTREMITY ANGIOGRAPHY;  Surgeon: Adrian Prows, MD;  Location: [place] [facility];  Service: Cardiovascular;  Laterality: N/A;   PERIPHERAL VASCULAR ATHERECTOMY Left 08/11/2021   Procedure: PERIPHERAL VASCULAR ATHERECTOMY;  Surgeon: Adrian Prows, MD;  Location: [place] [facility];  Service:  Cardiovascular;  Laterality: Left;   TUMOR EXCISION Bilateral 03/21/2020   Procedure: TUMOR EXCISION RECTAL, anal condyloma;  Surgeon: Ronny Bacon, MD;  Location: [facility];  Service: General;  Laterality: Bilateral;   Family History  Problem Relation Age of Onset   Diabetes Mother        amputations   Hypertension Mother    Cancer Father    COPD Brother    Cerebral aneurysm Brother    Heart disease Neg Hx    Stroke Neg Hx     Social History   Tobacco Use   Smoking status: Every Day    Packs/day: 0.50    Years: 23.00    Pack years: 11.50    Types: Cigarettes   Smokeless tobacco: Never  Substance Use Topics   Alcohol use: No   Marital Status: Single  ROS  Review of Systems  Cardiovascular:  Negative for chest pain, claudication, dyspnea on exertion and leg swelling.  Objective  Blood pressure 130/78, pulse 87, temperature 98.1 F (36.7 C), temperature source Temporal, resp. rate 17, height 5\' 9"  (1.753 m), weight 176 lb 3.2 oz (79.9 kg), SpO2 97 %. Body mass index is 26.02 kg/m.  Vitals with BMI 08/19/2021 08/19/2021 08/11/2021  Height - 5\' 9"  -  Weight - 176 lbs 3 oz -  BMI - 09.32 -  Systolic 671 245 809  Diastolic 78 88 90  Pulse - 87 55     Physical Exam Neck:     Vascular: No carotid bruit or JVD.  Cardiovascular:     Rate  and Rhythm: Normal rate and regular rhythm.     Pulses:          Radial pulses are 2+ on the right side and 2+ on the left side.       Femoral pulses are 2+ on the right side and 2+ on the left side.      Popliteal pulses are 2+ on the right side and 2+ on the left side.       Dorsalis pedis pulses are 1+ on the right side and 0 on the left side.       Posterior tibial pulses are 2+ on the right side and 2+ on the left side.     Heart sounds: Murmur heard.  High-pitched blowing decrescendo early diastolic murmur is present with a grade of 2/4 at the upper right sternal border.    No gallop.  Pulmonary:     Effort: Pulmonary effort is  normal.     Breath sounds: Normal breath sounds.  Abdominal:     General: Bowel sounds are normal.     Palpations: Abdomen is soft.  Musculoskeletal:     Right lower leg: No edema.     Left lower leg: No edema.  Skin:    Capillary Refill: Capillary refill takes less than 2 seconds.     Laboratory examination:   Recent Labs    04/24/21 1301 05/22/21 1344 07/21/21 0738  NA 141 142 137  K 4.6 4.0 5.3*  CL 100 105 105  CO2 26 24 25   GLUCOSE 88 79 96  BUN 13 16 20   CREATININE 1.25 0.99 1.05  CALCIUM 9.3 9.2 8.7*  GFRNONAA  --   --  >60   CrCl cannot be calculated (Patient's most recent lab result is older than the maximum 21 days allowed.).  CMP Latest Ref Rng & Units 07/21/2021 05/22/2021 04/24/2021  Glucose 70 - 99 mg/dL 96 79 88  BUN 6 - 20 mg/dL 20 16 13   Creatinine 0.61 - 1.24 mg/dL 1.05 0.99 1.25  Sodium 135 - 145 mmol/L 137 142 141  Potassium 3.5 - 5.1 mmol/L 5.3(H) 4.0 4.6  Chloride 98 - 111 mmol/L 105 105 100  CO2 22 - 32 mmol/L 25 24 26   Calcium 8.9 - 10.3 mg/dL 8.7(L) 9.2 9.3  Total Protein 6.0 - 8.5 g/dL - - 6.5  Total Bilirubin 0.0 - 1.2 mg/dL - - 0.2  Alkaline Phos 44 - 121 IU/L - - 99  AST 0 - 40 IU/L - - 16  ALT 0 - 44 IU/L - - 19   CBC Latest Ref Rng & Units 07/21/2021 04/24/2021 03/20/2020  WBC 4.0 - 10.5 K/uL 8.1 8.6 8.8  Hemoglobin 13.0 - 17.0 g/dL 12.3(L) 13.7 13.0  Hematocrit 39.0 - 52.0 % 37.6(L) 42.5 40.4  Platelets 150 - 400 K/uL 213 144(L) 204    Lipid Panel Recent Labs    05/22/21 1344  CHOL 141  TRIG 71  LDLCALC 79  HDL 48   Lipid Panel     Component Value Date/Time   CHOL 141 05/22/2021 1344   TRIG 71 05/22/2021 1344   HDL 48 05/22/2021 1344   CHOLHDL 6.0 (H) 12/31/2019 0822   CHOLHDL 4.9 01/14/2015 0001   VLDL 25 01/14/2015 0001   LDLCALC 79 05/22/2021 1344   LABVLDL 14 05/22/2021 1344     HEMOGLOBIN A1C Lab Results  Component Value Date   HGBA1C 5.5 08/29/2017   TSH No results for input(s): TSH in the last 8760  hours.  Medications and allergies  No Known Allergies   Medication prior to this encounter:   Outpatient Medications Prior to Visit  Medication Sig Dispense Refill   aspirin 81 MG EC tablet Take 1 tablet (81 mg total) by mouth daily. 30 tablet 2   lisinopril-hydrochlorothiazide (ZESTORETIC) 20-25 MG tablet Take 1 tablet by mouth once daily 90 tablet 0   metoprolol succinate (TOPROL-XL) 100 MG 24 hr tablet Take 50 mg by mouth daily. Take with or immediately following a meal.     rosuvastatin (CRESTOR) 20 MG tablet Take 1 tablet (20 mg total) by mouth daily. 90 tablet 3   spironolactone (ALDACTONE) 50 MG tablet Take 1 tablet (50 mg total) by mouth every morning. 90 tablet 1   clopidogrel (PLAVIX) 75 MG tablet Take 1 tablet (75 mg total) by mouth daily. 30 tablet 11   amLODipine (NORVASC) 5 MG tablet Take 5 mg by mouth every evening.     diclofenac Sodium (VOLTAREN) 1 % GEL Apply 1 application topically 4 (four) times daily as needed (arthritis pain (hands)).     spironolactone (ALDACTONE) 25 MG tablet Take 25 mg by mouth in the morning.     No facility-administered medications prior to visit.     Medication list after today's encounter   Current Outpatient Medications  Medication Instructions   Aspirin Low Dose 81 mg, Oral, Daily   clopidogrel (PLAVIX) 75 mg, Oral, Daily   lisinopril-hydrochlorothiazide (ZESTORETIC) 20-25 MG tablet Take 1 tablet by mouth once daily   metoprolol succinate (TOPROL-XL) 50 mg, Oral, Daily, Take with or immediately following a meal.   nicotine (NICODERM CQ) 14 mg, Transdermal, Daily   rosuvastatin (CRESTOR) 20 mg, Oral, Daily   spironolactone (ALDACTONE) 50 mg, Oral, BH-each morning    Radiology:   No results found.  Cardiac Studies:   PCV ECHOCARDIOGRAM COMPLETE 78/29/5621 Normal LV systolic function with visual EF 65-70%. Left ventricle cavity is normal in size. Mild left ventricular hypertrophy. Normal global wall motion. Normal diastolic  filling pattern, normal LAP. Mild to moderate aortic regurgitation. Mild pulmonic regurgitation. The aortic root is mildly dilated, 37mm. Proximal ascending aorta not well visualized. Compared to study 02/14/2018 no significant change. Prior dimension of the aortic root is unknown.    PCV MYOCARDIAL PERFUSION WO LEXISCAN 06/09/2021 Normal ECG stress. The patient exercised for 6 minutes and 0 seconds of a Bruce protocol, achieving approximately 7.05 METs and 88% of MPHR. The heart rate response was normal. The blood pressure response was hypertensive. The baseline blood pressure was 150/110 mmHg and increased to 190/104 mmHg. Resting EKG/ECG demonstrated normal sinus rhythm. Non-specific T-wave abnormalities. Peak EKG/ECG revealed normalization of resting ST-T abnormality. Myocardial perfusion is normal. Overall LV systolic function is normal without regional wall motion abnormalities. Stress LV EF: 49%, visually appears normal. No previous exam available for comparison. Low risk.   Carotid artery duplex 06/03/2021:  Duplex suggests stenosis in the right internal carotid artery (1-15%). Duplex suggests stenosis in the left internal carotid artery (1-15%). There is mild diffuse homogeneous plaque noted throughout the CCA and ICA bilaterally.  Antegrade right vertebral artery flow. Antegrade left vertebral artery flow. Follow up studies if clinically indicted.  Lower Extremity Arterial Duplex 06/03/2021:  No hemodynamically significant stenoses are identified in the right lower extremity arterial system. Moderate velocity increase at the left distal superficial femoral artery suggests >50% stenosis. There is monophasic waveforms below the distal left SFA suggests hemodynamically significant stenosis. This exam reveals normal perfusion of the right  lower extremity (ABI 0.99) and moderately decreased perfusion of the left lower extremity, noted at the post tibial artery level (ABI 0.63).  Compared  to the ABI done 01/25/2018, previously right ABI 0.94 and left ABI 0.98.  Peripheral arteriogram 08/11/2021: Abdominal aorta shows mild atherosclerotic changes in the abdominal aorta followed by mild ectasia in the mid and distal segment.  There are 2 renal arteries 1 on either sides.  There is mild diffuse atherosclerotic changes noted in the abdominal aorta.  Abdominal aorta has ectasia in the mid and distal segment. Right lower extremity: Right iliac artery is tortuous.  There is mild disease noted in the right common iliac artery.  Right external iliac and right common femoral artery and proximal SFA are widely patent.  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 Left lower extremity: Left common femoral artery and left proximal SFA has mild disease.  Left mid segment has a focal 70 to 80% stenosis followed by a tandem 100% stenosis, the left SFA reconstitutes outside of the Hunter's canal. There is one-vessel runoff in the form of posterior tibial artery below the knee, anterior tibial artery is occluded in the mid segment and reconstitutes to the level of the ankle and peroneal artery is diffusely diseased at the level of the ankle and just above the ankle.  Intervention data: Successful directional atherectomy using HawkOne LX followed by DCB 6.0 x 200 mm Ranger, stenosis reduced from 100% to 0% with brisk flow.  150 mL contrast utilized.  Right femoral arterial access closed with Perclose with excellent hemostasis.  Smoking cessation will be stressed to the patient.  EKG:   EKG 05/22/2021: Normal sinus rhythm at the rate of 55 bpm, normal axis, nonspecific T abnormality.    Assessment     ICD-10-CM   1. Claudication in peripheral vascular disease (HCC)  I73.9 clopidogrel (PLAVIX) 75 MG tablet    PCV ANKLE BRACHIAL INDEX (ABI)    2. Primary hypertension  I10     3. Tobacco use disorder  F17.200 nicotine (NICODERM CQ) 14 mg/24hr patch    4. Pure hypercholesterolemia   E78.00        Meds ordered this encounter  Medications   clopidogrel (PLAVIX) 75 MG tablet    Sig: Take 1 tablet (75 mg total) by mouth daily.    Dispense:  90 tablet    Refill:  1   nicotine (NICODERM CQ) 14 mg/24hr patch    Sig: Place 1 patch (14 mg total) onto the skin daily.    Dispense:  28 patch    Refill:  1    No orders of the defined types were placed in this encounter.   Recommendations:   Shane Valentine is a 56 y.o. African-American male patient with hypertension, hyperlipidemia, tobacco use disorder, CVA with left-sided hemiparesis in 2016, mildly abnormal distal anterior wall ischemia by nuclear perfusion study in 2015, moderate aortic regurgitation, peripheral artery disease, hypertension and hyperlipidemia.  He underwent successful atherectomy followed by drug-coated balloon angioplasty to left SFA tandem proximal and mid stenosis.  He had excellent results, he has noticed marked improvement in symptoms of claudication.  He has not had any complications from the procedure, right groin site has completely healed well without ecchymosis or hematoma.  He is still concerned about continued smoking, he appears to be very motivated but does have difficult time.  He is willing to try nicotine patches.  I also discussed with him that the recurrence of restenosis and PAD  progression would be extremely high if he continues to smoke.  He has only one-vessel runoff in the form of posterior tibial artery on the left leg.  Smoking counseling for additional 4 minutes.  With regard to peripheral artery disease continue dual antiplatelet therapy, aspirin and Plavix, he is also on a statin and lipids are now well controlled.  His blood pressure has now improved significantly, he did not start amlodipine, looks like the prescription probably was not sent but he is presently tolerating 50 mg of metoprolol succinate and also lisinopril HCT 20/12.5 mg along with spironolactone with excellent  control of blood pressure.  Continue the same.  ABI will be performed as a baseline post revascularization and again in 3 months and I will see him back then.    Adrian Prows, MD, [facility] 08/19/2021, 5:11 PM Office: [phone]

## 2021-08-20 ENCOUNTER — Telehealth (HOSPITAL_COMMUNITY): Payer: Self-pay | Admitting: Pharmacist

## 2021-08-20 ENCOUNTER — Other Ambulatory Visit: Payer: Self-pay | Admitting: Medical

## 2021-08-20 NOTE — Telephone Encounter (Signed)
Transitions of Care Pharmacy   Call attempted for a pharmacy transitions of care follow-up. HIPAA appropriate voicemail was left with call back information provided.   Call attempt #1. Will follow-up in 1-3 days.

## 2021-08-25 ENCOUNTER — Other Ambulatory Visit: Payer: BC Managed Care – PPO

## 2021-08-25 ENCOUNTER — Telehealth (HOSPITAL_COMMUNITY): Payer: Self-pay | Admitting: Pharmacist

## 2021-08-25 NOTE — Telephone Encounter (Signed)
Transitions of Care Pharmacy   Call attempted for a pharmacy transitions of care follow-up. HIPAA appropriate voicemail was left with call back information provided.   Call attempt #2. Will follow-up in 2-3 days.    

## 2021-08-26 ENCOUNTER — Telehealth (HOSPITAL_COMMUNITY): Payer: Self-pay

## 2021-08-26 ENCOUNTER — Other Ambulatory Visit (HOSPITAL_COMMUNITY): Payer: Self-pay

## 2021-08-26 NOTE — Telephone Encounter (Signed)
Pharmacy Transitions of Care Follow-up Telephone Call  Date of discharge: 08/11/2021  Discharge Diagnosis: Claudification, Uncontrolled Hypertension  How have you been since you were released from the hospital? Patient reports doing well. No questions or concerns at this time.    Medication changes made at discharge:  - START: Clopidogrel 75 mg daily  Medication changes verified by the patient? Yes    Medication Accessibility:  Home Pharmacy: [facility] - [address]   Was the patient provided with refills on discharged medications? Yes   Have all prescriptions been transferred from [facility] to home pharmacy? Yes   Is the patient able to afford medications? Yes   Medication Review: CLOPIDOGREL (PLAVIX) - Clopidogrel 75 mg once daily.  - Educated patient on expected duration of therapy of  with clopidogrel. Advised patient that aspirin will be continued indefinitely.  - Advised patient of medications to avoid (NSAIDs, ASA)  - Educated that Tylenol (acetaminophen) will be the preferred analgesic to prevent risk of bleeding  - Emphasized importance of monitoring for signs and symptoms of bleeding (abnormal bruising, prolonged bleeding, nose bleeds, bleeding from gums, discolored urine, black tarry stools)  - Advised patient to alert all providers of anticoagulation therapy prior to starting a new medication or having a procedure   Follow-up Appointments:  [facility] f/u appt confirmed? Dr. Einar Gip (Cardiology) Scheduled to see on 08/28/2021 @ 12:00 PM.   If their condition worsens, is the pt aware to call PCP or go to the Emergency Dept.? Yes  Final Patient Assessment: Patient reports doing well at this time. He states he has no trouble getting medications and requests medication to be sent to [facility] on [address]. Discussed side effects and monitoring with patients. No questions or concerns at this time.

## 2021-08-28 ENCOUNTER — Other Ambulatory Visit: Payer: Self-pay

## 2021-08-28 ENCOUNTER — Ambulatory Visit: Payer: BC Managed Care – PPO

## 2021-08-28 DIAGNOSIS — I739 Peripheral vascular disease, unspecified: Secondary | ICD-10-CM

## 2021-08-31 NOTE — Progress Notes (Signed)
Marked improvement in left leg circulation compared to previous

## 2021-08-31 NOTE — Progress Notes (Signed)
Called and spoke with patient regarding his ABI results.

## 2021-11-16 ENCOUNTER — Ambulatory Visit: Payer: Medicaid Other | Admitting: Cardiology

## 2021-12-21 ENCOUNTER — Ambulatory Visit: Payer: Medicaid Other | Admitting: Cardiology

## 2021-12-31 ENCOUNTER — Other Ambulatory Visit: Payer: Self-pay | Admitting: Cardiology

## 2021-12-31 DIAGNOSIS — I10 Essential (primary) hypertension: Secondary | ICD-10-CM

## 2022-02-01 ENCOUNTER — Ambulatory Visit: Payer: BC Managed Care – PPO | Admitting: Cardiology

## 2022-02-01 ENCOUNTER — Encounter: Payer: Self-pay | Admitting: Cardiology

## 2022-02-01 VITALS — BP 140/90 | HR 87 | Temp 98.7°F | Resp 16 | Ht 69.0 in | Wt 179.8 lb

## 2022-02-01 DIAGNOSIS — I351 Nonrheumatic aortic (valve) insufficiency: Secondary | ICD-10-CM | POA: Diagnosis not present

## 2022-02-01 DIAGNOSIS — I10 Essential (primary) hypertension: Secondary | ICD-10-CM | POA: Diagnosis not present

## 2022-02-01 DIAGNOSIS — I77810 Thoracic aortic ectasia: Secondary | ICD-10-CM

## 2022-02-01 DIAGNOSIS — F17200 Nicotine dependence, unspecified, uncomplicated: Secondary | ICD-10-CM

## 2022-02-01 DIAGNOSIS — I739 Peripheral vascular disease, unspecified: Secondary | ICD-10-CM | POA: Diagnosis not present

## 2022-02-01 MED ORDER — AMLODIPINE BESYLATE 5 MG PO TABS: 5.0000 mg | ORAL_TABLET | Freq: Every day | ORAL | 3 refills | Status: DC

## 2022-02-01 NOTE — Progress Notes (Signed)
Primary Physician/Referring:  Carlena Hurl, PA-C  Patient ID: Shane Valentine, male    DOB: 10-17-1965, 56 y.o.   MRN: [id number]  Chief Complaint  Patient presents with   PAD   Hypertension   Follow-up    3 months   HPI:    Shane Valentine  is a 56 y.o. African-American male patient with hypertension, hyperlipidemia, tobacco use disorder, CVA with left-sided hemiparesis in 2016, mildly abnormal distal anterior wall ischemia by nuclear perfusion study in 2015, moderate aortic regurgitation, peripheral artery disease, hypertension and hyperlipidemia.  He has had left SFA angioplasty with excellent results on 08/11/2021.  He has very mild symptoms of claudication involving the left calf only if he walks strenuously but overall states that he is feeling well.  He is still smoking about 1/2 pack or slightly more cigarettes a day.  Having difficulty quitting smoking.  Past Medical History:  Diagnosis Date   Arthritis    Genital warts    GERD (gastroesophageal reflux disease)    Headache    Hyperlipidemia    Hypertension    Paresthesia of hand, bilateral    Peripheral vascular disease ([place])    Smoker    Stroke [facility])    Tuberculosis 1995   67motherapy, [place]   Past Surgical History:  Procedure Laterality Date   INGUINAL HERNIA REPAIR Left 03/29/2014   Procedure: LAPAROSCOPIC LEFT INGUINAL HERNIA REPAIR;  Surgeon: ARalene Ok MD;  [facility]  Service: General;  Laterality: Left;   INSERTION OF MESH Left 03/29/2014   Procedure: INSERTION OF MESH;  Surgeon: ARalene Ok MD;  [facility] [facility]  Service: General;  Laterality: Left;   LOWER EXTREMITY ANGIOGRAPHY N/A 08/11/2021   Procedure: LOWER EXTREMITY ANGIOGRAPHY;  Surgeon: GAdrian Prows MD;  [facility];  Service: Cardiovascular;  Laterality: N/A;   PERIPHERAL VASCULAR ATHERECTOMY Left 08/11/2021   Procedure: PERIPHERAL VASCULAR ATHERECTOMY;  Surgeon: GAdrian Prows MD;  [facility];   Service: Cardiovascular;  Laterality: Left;   TUMOR EXCISION Bilateral 03/21/2020   Procedure: TUMOR EXCISION RECTAL, anal condyloma;  Surgeon: RRonny Bacon MD;  Location: [facility];  Service: General;  Laterality: Bilateral;   Family History  Problem Relation Age of Onset   Diabetes Mother        amputations   Hypertension Mother    Cancer Father    COPD Brother    Cerebral aneurysm Brother    Heart disease Neg Hx    Stroke Neg Hx     Social History   Tobacco Use   Smoking status: Every Day    Packs/day: 0.50    Years: 23.00    Total pack years: 11.50    Types: Cigarettes   Smokeless tobacco: Never  Substance Use Topics   Alcohol use: No   Marital Status: Single  ROS  Review of Systems  Cardiovascular:  Positive for claudication. Negative for chest pain, dyspnea on exertion and leg swelling.   Objective  Blood pressure 140/90, pulse 87, temperature 98.7 F (37.1 C), temperature source Temporal, resp. rate 16, height '5\' 9"'$  (1.753 m), weight 179 lb 12.8 oz (81.6 kg), SpO2 97 %. Body mass index is 26.55 kg/m.     02/01/2022   12:01 PM 02/01/2022   11:39 AM 08/19/2021    3:57 PM  Vitals with BMI  Height  '5\' 9"'$    Weight  179 lbs 13 oz   BMI  210.27  Systolic [id number]  Diastolic 90 95 78  Pulse  87      Physical Exam Neck:     Vascular: No carotid bruit or JVD.  Cardiovascular:     Rate and Rhythm: Normal rate and regular rhythm.     Pulses:          Radial pulses are 2+ on the right side and 2+ on the left side.       Femoral pulses are 2+ on the right side and 2+ on the left side.      Popliteal pulses are 2+ on the right side and 2+ on the left side.       Dorsalis pedis pulses are 1+ on the right side and 1+ on the left side.       Posterior tibial pulses are 1+ on the right side and 1+ on the left side.     Heart sounds: Murmur heard.     High-pitched blowing early diastolic murmur is present with a grade of 2/4 at the upper right sternal border.     No  gallop.  Pulmonary:     Effort: Pulmonary effort is normal.     Breath sounds: Normal breath sounds.  Abdominal:     General: Bowel sounds are normal.     Palpations: Abdomen is soft.  Musculoskeletal:     Right lower leg: No edema.     Left lower leg: No edema.  Skin:    Capillary Refill: Capillary refill takes less than 2 seconds.      Laboratory examination:   Recent Labs    04/24/21 1301 05/22/21 1344 07/21/21 0738  NA 141 142 137  K 4.6 4.0 5.3*  CL 100 105 105  CO2 '26 24 25  '$ GLUCOSE 88 79 96  BUN '13 16 20  '$ CREATININE 1.25 0.99 1.05  CALCIUM 9.3 9.2 8.7*  GFRNONAA  --   --  >60   CrCl cannot be calculated (Patient's most recent lab result is older than the maximum 21 days allowed.).     Latest Ref Rng & Units 07/21/2021    7:38 AM 05/22/2021    1:44 PM 04/24/2021    1:01 PM  CMP  Glucose 70 - 99 mg/dL 96  79  88   BUN 6 - 20 mg/dL '20  16  13   '$ Creatinine 0.61 - 1.24 mg/dL 1.05  0.99  1.25   Sodium 135 - 145 mmol/L 137  142  141   Potassium 3.5 - 5.1 mmol/L 5.3  4.0  4.6   Chloride 98 - 111 mmol/L 105  105  100   CO2 22 - 32 mmol/L '25  24  26   '$ Calcium 8.9 - 10.3 mg/dL 8.7  9.2  9.3   Total Protein 6.0 - 8.5 g/dL   6.5   Total Bilirubin 0.0 - 1.2 mg/dL   0.2   Alkaline Phos 44 - 121 IU/L   99   AST 0 - 40 IU/L   16   ALT 0 - 44 IU/L   19       Latest Ref Rng & Units 07/21/2021    7:38 AM 04/24/2021    1:01 PM 03/20/2020    8:46 AM  CBC  WBC 4.0 - 10.5 K/uL 8.1  8.6  8.8   Hemoglobin 13.0 - 17.0 g/dL 12.3  13.7  13.0   Hematocrit 39.0 - 52.0 % 37.6  42.5  40.4   Platelets 150 - 400 K/uL 213  144  204     Lipid Panel Recent Labs    05/22/21 1344  CHOL 141  TRIG 71  LDLCALC 79  HDL 48    HEMOGLOBIN A1C Lab Results  Component Value Date   HGBA1C 5.5 08/29/2017   TSH No results for input(s): "TSH" in the last 8760 hours.  Medications and allergies  No Known Allergies   Medications   Current Outpatient Medications:    amLODipine (NORVASC)  5 MG tablet, Take 1 tablet (5 mg total) by mouth daily., Disp: 90 tablet, Rfl: 3   aspirin 81 MG EC tablet, Take 1 tablet (81 mg total) by mouth daily., Disp: 30 tablet, Rfl: 2   clopidogrel (PLAVIX) 75 MG tablet, Take 1 tablet (75 mg total) by mouth daily., Disp: 90 tablet, Rfl: 1   lisinopril-hydrochlorothiazide (ZESTORETIC) 20-25 MG tablet, Take 1 tablet by mouth once daily, Disp: 90 tablet, Rfl: 0   metoprolol succinate (TOPROL-XL) 100 MG 24 hr tablet, Take 50 mg by mouth daily. Take with or immediately following a meal., Disp: , Rfl:    nicotine (NICODERM CQ) 14 mg/24hr patch, Place 1 patch (14 mg total) onto the skin daily., Disp: 28 patch, Rfl: 1   rosuvastatin (CRESTOR) 20 MG tablet, Take 1 tablet (20 mg total) by mouth daily., Disp: 90 tablet, Rfl: 3   spironolactone (ALDACTONE) 50 MG tablet, TAKE 1 TABLET BY MOUTH IN THE MORNING, Disp: 90 tablet, Rfl: 0    Radiology:   No results found.  Cardiac Studies:   PCV ECHOCARDIOGRAM COMPLETE 08/65/7846 Normal LV systolic function with visual EF 65-70%. Left ventricle cavity is normal in size. Mild left ventricular hypertrophy. Normal global wall motion. Normal diastolic filling pattern, normal LAP. Mild to moderate aortic regurgitation. Mild pulmonic regurgitation. The aortic root is mildly dilated, 57m. Proximal ascending aorta not well visualized. Compared to study 02/14/2018 no significant change. Prior dimension of the aortic root is unknown.    PCV MYOCARDIAL PERFUSION WO LEXISCAN 06/09/2021 Normal ECG stress. The patient exercised for 6 minutes and 0 seconds of a Bruce protocol, achieving approximately 7.05 METs and 88% of MPHR. The heart rate response was normal. The blood pressure response was hypertensive. The baseline blood pressure was 150/110 mmHg and increased to 190/104 mmHg. Resting EKG/ECG demonstrated normal sinus rhythm. Non-specific T-wave abnormalities. Peak EKG/ECG revealed normalization of resting ST-T  abnormality. Myocardial perfusion is normal. Overall LV systolic function is normal without regional wall motion abnormalities. Stress LV EF: 49%, visually appears normal. No previous exam available for comparison. Low risk.   Carotid artery duplex 06/03/2021:  Duplex suggests stenosis in the right internal carotid artery (1-15%). Duplex suggests stenosis in the left internal carotid artery (1-15%). There is mild diffuse homogeneous plaque noted throughout the CCA and ICA bilaterally.  Antegrade right vertebral artery flow. Antegrade left vertebral artery flow. Follow up studies if clinically indicted.  Lower Extremity Arterial Duplex 06/03/2021:  No hemodynamically significant stenoses are identified in the right lower extremity arterial system. Moderate velocity increase at the left distal superficial femoral artery suggests >50% stenosis. There is monophasic waveforms below the distal left SFA suggests hemodynamically significant stenosis. This exam reveals normal perfusion of the right lower extremity (ABI 0.99) and moderately decreased perfusion of the left lower extremity, noted at the post tibial artery level (ABI 0.63).  Compared to the ABI done 01/25/2018, previously right ABI 0.94 and left ABI 0.98.  Peripheral arteriogram 08/11/2021: Abdominal aorta shows mild atherosclerotic changes in the abdominal aorta followed by mild ectasia  in the mid and distal segment.  There are 2 renal arteries 1 on either sides.  There is mild diffuse atherosclerotic changes noted in the abdominal aorta.  Abdominal aorta has ectasia in the mid and distal segment. Right lower extremity: Right iliac artery is tortuous.  There is mild disease noted in the right common iliac artery.  Right external iliac and right common femoral artery and proximal SFA are widely patent.  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 Left lower extremity: Left common femoral artery and left proximal SFA has mild  disease.  Left mid segment has a focal 70 to 80% stenosis followed by a tandem 100% stenosis, the left SFA reconstitutes outside of the Hunter's canal. There is one-vessel runoff in the form of posterior tibial artery below the knee, anterior tibial artery is occluded in the mid segment and reconstitutes to the level of the ankle and peroneal artery is diffusely diseased at the level of the ankle and just above the ankle.  Intervention data: Successful directional atherectomy using HawkOne LX followed by DCB 6.0 x 200 mm Ranger, stenosis reduced from 100% to 0% with brisk flow.  150 mL contrast utilized.  Right femoral arterial access closed with Perclose with excellent hemostasis.  Smoking cessation will be stressed to the patient.  ABI 08/28/2021: This exam reveals normal perfusion of the right lower extremity (ABI 1.07) and normal perfusion of the left lower extremity (ABI 1.1).  There is mildly abnormal biphasic waveform at the level of the ankles.  Compared to 06/03/2021, left ABI has improved from 0.63 and suggests successful revascularization of the left lower extremity.  EKG:   EKG 05/22/2021: Normal sinus rhythm at the rate of 55 bpm, normal axis, nonspecific T abnormality.    Assessment     ICD-10-CM   1. Claudication in peripheral vascular disease (HCC)  I73.9     2. Primary hypertension  I10 amLODipine (NORVASC) 5 MG tablet    3. Tobacco use disorder  F17.200     4. Aortic root dilatation (HCC)  I77.810 PCV ECHOCARDIOGRAM COMPLETE    5. Moderate aortic regurgitation  I35.1 PCV ECHOCARDIOGRAM COMPLETE       Meds ordered this encounter  Medications   amLODipine (NORVASC) 5 MG tablet    Sig: Take 1 tablet (5 mg total) by mouth daily.    Dispense:  90 tablet    Refill:  3    Orders Placed This Encounter  Procedures   PCV ECHOCARDIOGRAM COMPLETE    Standing Status:   Future    Standing Expiration Date:   02/02/2023    Recommendations:   INGRAM ONNEN is a 56 y.o.  African-American male patient with hypertension, hyperlipidemia, tobacco use disorder, CVA with left-sided hemiparesis in 2016, mildly abnormal distal anterior wall ischemia by nuclear perfusion study in 2015, moderate aortic regurgitation, peripheral artery disease, hypertension and hyperlipidemia.  He has had left SFA angioplasty with excellent results on 08/11/2021.  Symptoms of claudication is very mild, he still has good pedal pulses in the lower extremity although PT pulses are reduced.  He is still having difficulty quitting smoking but is trying his best.  Lipids are well controlled.  Blood pressure was elevated today, I have added amlodipine 5 mg daily.  Reviewed the results of the ABI, improvement in ABI at 3 months and symptoms are also stable.  Aortic regurgitation murmur appears stable.  We will repeat echocardiogram in 6 months to follow-up on aortic root dilatation as well  and see him back at that time.     Adrian Prows, MD, [facility] 02/01/2022, 12:10 PM Office: [phone]

## 2022-02-03 ENCOUNTER — Emergency Department (HOSPITAL_COMMUNITY): Payer: BC Managed Care – PPO

## 2022-02-03 ENCOUNTER — Encounter (HOSPITAL_COMMUNITY): Payer: Self-pay | Admitting: Emergency Medicine

## 2022-02-03 ENCOUNTER — Emergency Department (HOSPITAL_COMMUNITY)
Admission: EM | Admit: 2022-02-03 | Discharge: 2022-02-03 | Disposition: A | Discharge status: Home / Self Care | Payer: BC Managed Care – PPO | Attending: Emergency Medicine | Admitting: Emergency Medicine

## 2022-02-03 DIAGNOSIS — S39012A Strain of muscle, fascia and tendon of lower back, initial encounter: Secondary | ICD-10-CM | POA: Insufficient documentation

## 2022-02-03 DIAGNOSIS — Z7902 Long term (current) use of antithrombotics/antiplatelets: Secondary | ICD-10-CM | POA: Diagnosis not present

## 2022-02-03 DIAGNOSIS — Y92410 Unspecified street and highway as the place of occurrence of the external cause: Secondary | ICD-10-CM | POA: Diagnosis not present

## 2022-02-03 DIAGNOSIS — S3992XA Unspecified injury of lower back, initial encounter: Secondary | ICD-10-CM | POA: Diagnosis not present

## 2022-02-03 DIAGNOSIS — M545 Low back pain, unspecified: Secondary | ICD-10-CM | POA: Diagnosis not present

## 2022-02-03 DIAGNOSIS — Z7982 Long term (current) use of aspirin: Secondary | ICD-10-CM | POA: Diagnosis not present
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
## 2022-02-03 NOTE — Discharge Instructions (Signed)
You can use Tylenol every 4 hours as needed for pain.  Ice as needed.  Follow-up with your local doctor if no improvement 1 week.  Return to the ER for leg numbness, weakness, difficulty with urination, severe abdominal pain or new concerns.
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
## 2022-02-03 NOTE — ED Notes (Signed)
No answer when called for triage x1

## 2022-02-03 NOTE — ED Notes (Signed)
No answer when called for triage x2

## 2022-04-14 ENCOUNTER — Ambulatory Visit (HOSPITAL_COMMUNITY)
Admission: EM | Admit: 2022-04-14 | Discharge: 2022-04-14 | Disposition: A | Discharge status: Home / Self Care | Payer: BC Managed Care – PPO | Attending: Family Medicine | Admitting: Family Medicine

## 2022-04-14 ENCOUNTER — Encounter (HOSPITAL_COMMUNITY): Payer: Self-pay

## 2022-04-14 DIAGNOSIS — R059 Cough, unspecified: Secondary | ICD-10-CM | POA: Diagnosis not present

## 2022-04-14 DIAGNOSIS — Z20822 Contact with and (suspected) exposure to covid-19: Secondary | ICD-10-CM | POA: Insufficient documentation

## 2022-04-14 DIAGNOSIS — J069 Acute upper respiratory infection, unspecified: Secondary | ICD-10-CM | POA: Diagnosis not present

## 2022-04-14 LAB — RESP PANEL BY RT-PCR (RSV, FLU A&B, COVID)  RVPGX2
Influenza A by PCR: NEGATIVE
Influenza B by PCR: NEGATIVE
Resp Syncytial Virus by PCR: NEGATIVE
SARS Coronavirus 2 by RT PCR: NEGATIVE

## 2022-04-14 MED ORDER — PROMETHAZINE-DM 6.25-15 MG/5ML PO SYRP: 5.0000 mL | ORAL_SOLUTION | Freq: Four times a day (QID) | ORAL | 0 refills | Status: DC | PRN

## 2022-04-14 NOTE — ED Triage Notes (Signed)
Pt c/o cough, congestion, and headache x3 day. Took OTC with no relief. States had a neg home covid test. States will need a work note.

## 2022-04-14 NOTE — Discharge Instructions (Addendum)
You have been tested for COVID-19/RSV/flu today. If your test returns positive, you will receive a phone call from [facility] regarding your results. Negative test results are not called. Both positive and negative results area always visible on MyChart. If you do not have a MyChart account, sign up instructions are provided in your discharge papers. Please do not hesitate to contact us should you have questions or concerns.

## 2022-04-14 NOTE — ED Provider Notes (Signed)
Shane Valentine   [id number] 04/14/22 Arrival Time: 2831  ASSESSMENT & PLAN:  1. Viral URI with cough    Discussed typical duration of viral illnesses. Viral testing sent.   RESP PANEL BY RT-PCR (RSV, FLU A&B, COVID)  RVPGX2   Work note provided. OTC symptom care as needed.  Discharge Medication List as of 04/14/2022 10:22 AM     START taking these medications   Details  promethazine-dextromethorphan (PROMETHAZINE-DM) 6.25-15 MG/5ML syrup Take 5 mLs by mouth 4 (four) times daily as needed for cough., Starting Wed 04/14/2022, Normal         Follow-up Information     Tysinger, Camelia Eng, PA-C.   Specialty: Family Medicine Why: As needed. Contact information: [address] [place] [address] [phone]                 Reviewed expectations re: course of current medical issues. Questions answered. Outlined signs and symptoms indicating need for more acute intervention. Understanding verbalized. After Visit Summary given.   SUBJECTIVE: History from: Patient. Shane Valentine is a 56 y.o. male. Reports: cough, congestion, HA; abrupt onset; x 3 days; OTC s relief. Negative home COVID test. Denies: fever and difficulty breathing. Normal PO intake without n/v/d.  OBJECTIVE:  Vitals:   04/14/22 1005  BP: (!) 154/93  Pulse: 73  Resp: 18  Temp: 98 F (36.7 C)  TempSrc: Oral  SpO2: 96%    General appearance: alert; no distress Eyes: PERRLA; EOMI; conjunctiva normal HENT: [place]; AT; with nasal congestion Neck: supple  Lungs: speaks full sentences without difficulty; unlabored; dry cough Extremities: no edema Skin: warm and dry Neurologic: normal gait Psychological: alert and cooperative; normal mood and affect  Labs:  Labs Reviewed  RESP PANEL BY RT-PCR (RSV, FLU A&B, COVID)  [id number]   No Known Allergies  Past Medical History:  Diagnosis Date   Arthritis    Genital warts    GERD (gastroesophageal reflux disease)    Headache     Hyperlipidemia    Hypertension    Paresthesia of hand, bilateral    Peripheral vascular disease (HCC)    Smoker    Stroke ([place])    Tuberculosis 1995   67motherapy, [place]   Social History   Socioeconomic History   Marital status: Single    Spouse name: Not on file   Number of children: 3   Years of education: Not on file   Highest education level: Not on file  Occupational History   Not on file  Tobacco Use   Smoking status: Every Day    Packs/day: 0.50    Years: 23.00    Total pack years: 11.50    Types: Cigarettes   Smokeless tobacco: Never  Vaping Use   Vaping Use: Never used  Substance and Sexual Activity   Alcohol use: No   Drug use: Yes    Types: Marijuana   Sexual activity: Never  Other Topics Concern   Not on file  Social History Narrative   Uses jump rope and some dumbbells regularly.   No significant other.   Lives alone.   Works [employer], pTeacher, English as a foreign language  Has 3 children, 1 grand child.  12/2019   Social Determinants of Health   Financial Resource Strain: Not on file  Food Insecurity: Not on file  Transportation Needs: Not on file  Physical Activity: Not on file  Stress: Not on file  Social Connections: Not on file  Intimate Partner Violence:  Not on file   Family History  Problem Relation Age of Onset   Diabetes Mother        amputations   Hypertension Mother    Cancer Father    COPD Brother    Cerebral aneurysm Brother    Heart disease Neg Hx    Stroke Neg Hx    Past Surgical History:  Procedure Laterality Date   INGUINAL HERNIA REPAIR Left 03/29/2014   Procedure: LAPAROSCOPIC LEFT INGUINAL HERNIA REPAIR;  Surgeon: Ralene Ok, MD;  Location: [facility];  Service: General;  Laterality: Left;   INSERTION OF MESH Left 03/29/2014   Procedure: INSERTION OF MESH;  Surgeon: Ralene Ok, MD;  Location: [facility];  Service: General;  Laterality: Left;   LOWER EXTREMITY ANGIOGRAPHY N/A 08/11/2021   Procedure: LOWER  EXTREMITY ANGIOGRAPHY;  Surgeon: Adrian Prows, MD;  Location: [facility];  Service: Cardiovascular;  Laterality: N/A;   PERIPHERAL VASCULAR ATHERECTOMY Left 08/11/2021   Procedure: PERIPHERAL VASCULAR ATHERECTOMY;  Surgeon: Adrian Prows, MD;  Location: [facility];  Service: Cardiovascular;  Laterality: Left;   TUMOR EXCISION Bilateral 03/21/2020   Procedure: TUMOR EXCISION RECTAL, anal condyloma;  Surgeon: Ronny Bacon, MD;  Location: [facility];  Service: General;  Laterality: Jilda Panda, MD 04/14/22 1144

## 2022-04-21 ENCOUNTER — Ambulatory Visit: Payer: BC Managed Care – PPO | Admitting: Medical

## 2022-04-21 ENCOUNTER — Other Ambulatory Visit: Payer: Self-pay

## 2022-04-21 ENCOUNTER — Telehealth: Payer: Self-pay | Admitting: Medical

## 2022-04-21 VITALS — BP 110/62 | HR 96 | Wt 175.6 lb

## 2022-04-21 DIAGNOSIS — Z2821 Immunization not carried out because of patient refusal: Secondary | ICD-10-CM

## 2022-04-21 DIAGNOSIS — F17200 Nicotine dependence, unspecified, uncomplicated: Secondary | ICD-10-CM | POA: Diagnosis not present

## 2022-04-21 DIAGNOSIS — Z125 Encounter for screening for malignant neoplasm of prostate: Secondary | ICD-10-CM

## 2022-04-21 DIAGNOSIS — Z7185 Encounter for immunization safety counseling: Secondary | ICD-10-CM | POA: Diagnosis not present

## 2022-04-21 DIAGNOSIS — I10 Essential (primary) hypertension: Secondary | ICD-10-CM

## 2022-04-21 DIAGNOSIS — Z1211 Encounter for screening for malignant neoplasm of colon: Secondary | ICD-10-CM

## 2022-04-21 DIAGNOSIS — E785 Hyperlipidemia, unspecified: Secondary | ICD-10-CM

## 2022-04-21 MED ORDER — ASPIRIN 81 MG PO TBEC: 81.0000 mg | DELAYED_RELEASE_TABLET | Freq: Every day | ORAL | 3 refills | Status: DC

## 2022-04-21 MED ORDER — AMLODIPINE BESYLATE 5 MG PO TABS: 5.0000 mg | ORAL_TABLET | Freq: Every day | ORAL | Status: DC

## 2022-04-21 MED ORDER — ROSUVASTATIN CALCIUM 20 MG PO TABS: 20.0000 mg | ORAL_TABLET | Freq: Every day | ORAL | 0 refills | Status: DC

## 2022-04-21 NOTE — Progress Notes (Signed)
Subjective:  Shane Valentine is a 56 y.o. male who presents for Chief Complaint  Patient presents with   med check    Med check- had flu like symptoms last week feeling better but congested still     Here for med check.  Nonfasting today  He has a history of hypertension, hyperlipidemia, claudication, tobacco use, chronic low back pain, history of stroke.  He does not have his pill bottles with him today.  When reviewing the medications it is not exactly clear what he is taking and what he is not taking.  His lisinopril dates of refill showed that he would have run out months ago but he says he still taking this.  On the other hand he does not even know if he is taking Plavix or not and he is either taking 1/2 tablet of either spironolactone or metoprolol but he is not sure.  He feels fine.  No particular complaints.  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 Current Outpatient Medications:    amLODipine (NORVASC) 5 MG tablet, Take 1 tablet (5 mg total) by mouth daily., Disp: 90 tablet, Rfl: 3   aspirin 81 MG EC tablet, Take 1 tablet (81 mg total) by mouth daily., Disp: 30 tablet, Rfl: 2   clopidogrel (PLAVIX) 75 MG tablet, Take 1 tablet (75 mg total) by mouth daily., Disp: 90 tablet, Rfl: 1   lisinopril-hydrochlorothiazide (ZESTORETIC) 20-25 MG tablet, Take 1 tablet by mouth once daily, Disp: 90 tablet, Rfl: 0   metoprolol succinate (TOPROL-XL) 100 MG 24 hr tablet, Take 50 mg by mouth daily. Take with or immediately following a meal., Disp: , Rfl:    nicotine (NICODERM CQ) 14 mg/24hr patch, Place 1 patch (14 mg total) onto the skin daily., Disp: 28 patch, Rfl: 1   rosuvastatin (CRESTOR) 20 MG tablet, Take 1 tablet (20 mg total) by mouth daily., Disp: 90 tablet, Rfl: 3   spironolactone (ALDACTONE) 50 MG tablet, TAKE 1 TABLET BY MOUTH IN THE MORNING, Disp: 90 tablet, Rfl: 0     No other aggravating or relieving factors.    No other  c/o.  Past Medical History:  Diagnosis Date   Arthritis    Genital warts    GERD (gastroesophageal reflux disease)    Headache    Hyperlipidemia    Hypertension    Paresthesia of hand, bilateral    Peripheral vascular disease ([place])    Smoker    Stroke ([place])    Tuberculosis 1995   38motherapy, [place]   Current Outpatient Medications on File Prior to Visit  Medication Sig Dispense Refill   amLODipine (NORVASC) 5 MG tablet Take 1 tablet (5 mg total) by mouth daily. 90 tablet 3   lisinopril-hydrochlorothiazide (ZESTORETIC) 20-25 MG tablet Take 1 tablet by mouth once daily 90 tablet 0   promethazine-dextromethorphan (PROMETHAZINE-DM) 6.25-15 MG/5ML syrup Take 5 mLs by mouth 4 (four) times daily as needed for cough. 118 mL 0   No current facility-administered medications on file prior to visit.   The following portions of the patient's history were reviewed and updated as appropriate: allergies, current medications, past family history, past medical history, past social history, past surgical history and problem list.  ROS Otherwise as in subjective above    Objective: BP 110/62   Pulse 96   Wt 175 lb 9.6 oz (79.7 kg)   BMI 25.93 kg/m   BP Readings from Last 3 Encounters:  04/21/22 110/62  04/14/22 (!) 154/93  02/03/22 140/88   Wt Readings from Last 3 Encounters:  04/21/22 175 lb 9.6 oz (79.7 kg)  02/01/22 179 lb 12.8 oz (81.6 kg)  08/19/21 176 lb 3.2 oz (79.9 kg)   General appearance: alert, no distress, well developed, well nourished Neck: supple, no lymphadenopathy, no thyromegaly, no masses Heart: RRR, normal S1, S2, no murmurs Lungs: CTA bilaterally, no wheezes, rhonchi, or rales Pulses: 2+ radial pulses, 2+ pedal pulses, normal cap refill Ext: no edema    Assessment: Encounter Diagnoses  Name Primary?   Primary hypertension Yes   Influenza vaccination declined    Tobacco use disorder    Vaccine counseling    Hyperlipidemia, unspecified  hyperlipidemia type    Screen for colon cancer    Screening for prostate cancer      Plan: He is going to double check his medications at home with the list we gave him today to verify what he is actually taking what he is not taking.  He is due for follow-up with cardiology at this time.  He will call and schedule  Blood pressure at goal.  He will come in next week for fasting labs  We will update the Cologuard cancer screening as he never turned in from 2 years ago.  Due for PSA prostate cancer screening as well  Encouraged him to quit smoking  Shane Valentine was seen today for med check.  Diagnoses and all orders for this visit:  Primary hypertension -     CBC; Future  Influenza vaccination declined  Tobacco use disorder  Vaccine counseling  Hyperlipidemia, unspecified hyperlipidemia type -     Lipid panel; Future -     Comprehensive metabolic panel; Future  Screen for colon cancer -     Cologuard  Screening for prostate cancer -     PSA; Future  Other orders -     aspirin EC 81 MG tablet; Take 1 tablet (81 mg total) by mouth daily. -     rosuvastatin (CRESTOR) 20 MG tablet; Take 1 tablet (20 mg total) by mouth daily.    Follow up: Next week for labs as planned

## 2022-04-21 NOTE — Telephone Encounter (Signed)
Pt requesting refill on his amlodipine to same pharmacy.

## 2022-04-21 NOTE — Telephone Encounter (Signed)
done

## 2022-04-27 ENCOUNTER — Other Ambulatory Visit: Payer: BC Managed Care – PPO

## 2022-04-27 DIAGNOSIS — I10 Essential (primary) hypertension: Secondary | ICD-10-CM | POA: Diagnosis not present

## 2022-04-27 DIAGNOSIS — Z125 Encounter for screening for malignant neoplasm of prostate: Secondary | ICD-10-CM | POA: Diagnosis not present

## 2022-04-27 DIAGNOSIS — D649 Anemia, unspecified: Secondary | ICD-10-CM | POA: Diagnosis not present

## 2022-04-27 DIAGNOSIS — E785 Hyperlipidemia, unspecified: Secondary | ICD-10-CM

## 2022-04-28 ENCOUNTER — Other Ambulatory Visit: Payer: Self-pay | Admitting: Medical

## 2022-04-28 ENCOUNTER — Encounter: Payer: Self-pay | Admitting: Medical

## 2022-04-28 ENCOUNTER — Other Ambulatory Visit: Payer: Self-pay | Admitting: Internal Medicine

## 2022-04-28 DIAGNOSIS — E611 Iron deficiency: Secondary | ICD-10-CM

## 2022-04-28 DIAGNOSIS — Z1211 Encounter for screening for malignant neoplasm of colon: Secondary | ICD-10-CM

## 2022-04-28 LAB — LIPID PANEL
Chol/HDL Ratio: 3.4 ratio (ref 0.0–5.0)
Cholesterol, Total: 158 mg/dL (ref 100–199)
HDL: 47 mg/dL (ref >39)
LDL Chol Calc (NIH): 87 mg/dL (ref 0–99)
Triglycerides: 138 mg/dL (ref 0–149)
VLDL Cholesterol Cal: 24 mg/dL (ref 5–40)

## 2022-04-28 LAB — COMPREHENSIVE METABOLIC PANEL
ALT: 17 IU/L (ref 0–44)
AST: 17 IU/L (ref 0–40)
Albumin/Globulin Ratio: 2.2 (ref 1.2–2.2)
Albumin: 4.4 g/dL (ref 3.8–4.9)
Alkaline Phosphatase: 85 IU/L (ref 44–121)
BUN/Creatinine Ratio: 10 (ref 9–20)
BUN: 11 mg/dL (ref 6–24)
Bilirubin Total: <0.2 mg/dL (ref 0.0–1.2)
CO2: 23 mmol/L (ref 20–29)
Calcium: 9.4 mg/dL (ref 8.7–10.2)
Chloride: 105 mmol/L (ref 96–106)
Creatinine, Ser: 1.05 mg/dL (ref 0.76–1.27)
Globulin, Total: 2 g/dL (ref 1.5–4.5)
Glucose: 106 mg/dL — ABNORMAL HIGH (ref 70–99)
Potassium: 4.1 mmol/L (ref 3.5–5.2)
Sodium: 143 mmol/L (ref 134–144)
Total Protein: 6.4 g/dL (ref 6.0–8.5)
eGFR: 83 mL/min/{1.73_m2} (ref >59)

## 2022-04-28 LAB — CBC
Hematocrit: 39.5 % (ref 37.5–51.0)
Hemoglobin: 12.6 g/dL — ABNORMAL LOW (ref 13.0–17.7)
MCH: 31.1 pg (ref 26.6–33.0)
MCHC: 31.9 g/dL (ref 31.5–35.7)
MCV: 98 fL — ABNORMAL HIGH (ref 79–97)
Platelets: 169 10*3/uL (ref 150–450)
RBC: 4.05 x10E6/uL — ABNORMAL LOW (ref 4.14–5.80)
RDW: 12.3 % (ref 11.6–15.4)
WBC: 10.8 10*3/uL (ref 3.4–10.8)

## 2022-04-28 LAB — PSA: Prostate Specific Ag, Serum: 0.6 ng/mL (ref 0.0–4.0)

## 2022-04-28 MED ORDER — LISINOPRIL-HYDROCHLOROTHIAZIDE 20-25 MG PO TABS: 1.0000 | ORAL_TABLET | Freq: Every day | ORAL | 3 refills | Status: DC

## 2022-04-29 ENCOUNTER — Other Ambulatory Visit: Payer: Self-pay | Admitting: Medical

## 2022-04-29 LAB — FOLATE: Folate: 5 ng/mL (ref >3.0)

## 2022-04-29 LAB — VITAMIN B12: Vitamin B-12: 512 pg/mL (ref 232–1245)

## 2022-04-29 LAB — SPECIMEN STATUS REPORT

## 2022-04-29 LAB — IRON: Iron: 31 ug/dL — ABNORMAL LOW (ref 38–169)

## 2022-04-29 MED ORDER — FERROUS GLUCONATE 324 (38 FE) MG PO TABS: 324.0000 mg | ORAL_TABLET | Freq: Every day | ORAL | 1 refills | Status: DC

## 2022-05-12 ENCOUNTER — Encounter: Payer: Self-pay | Admitting: Gastroenterology

## 2022-05-13 ENCOUNTER — Telehealth: Payer: Self-pay

## 2022-05-13 NOTE — Telephone Encounter (Signed)
Stop Aspirin now. Hold Plavix 5 days before and restart as per GI recommendations

## 2022-05-13 NOTE — Telephone Encounter (Signed)
Patient called to let you know he is scheduled for a colonoscopy 06/08/22. He also said his iron is low and asked if you had any recommendations?

## 2022-06-01 ENCOUNTER — Ambulatory Visit: Payer: BC Managed Care – PPO | Admitting: Gastroenterology

## 2022-06-08 ENCOUNTER — Ambulatory Visit: Payer: BC Managed Care – PPO | Admitting: Gastroenterology

## 2022-06-08 ENCOUNTER — Encounter: Payer: Self-pay | Admitting: Gastroenterology

## 2022-06-08 ENCOUNTER — Telehealth: Payer: Self-pay

## 2022-06-08 ENCOUNTER — Other Ambulatory Visit (INDEPENDENT_AMBULATORY_CARE_PROVIDER_SITE_OTHER): Payer: BC Managed Care – PPO

## 2022-06-08 VITALS — BP 122/80 | HR 86 | Ht 69.0 in | Wt 184.0 lb

## 2022-06-08 DIAGNOSIS — Z1211 Encounter for screening for malignant neoplasm of colon: Secondary | ICD-10-CM | POA: Diagnosis not present

## 2022-06-08 DIAGNOSIS — D649 Anemia, unspecified: Secondary | ICD-10-CM | POA: Diagnosis not present

## 2022-06-08 DIAGNOSIS — I739 Peripheral vascular disease, unspecified: Secondary | ICD-10-CM

## 2022-06-08 LAB — IBC + FERRITIN
Ferritin: 127.4 ng/mL (ref 22.0–322.0)
Iron: 55 ug/dL (ref 42–165)
Saturation Ratios: 17.4 % — ABNORMAL LOW (ref 20.0–50.0)
TIBC: 316.4 ug/dL (ref 250.0–450.0)
Transferrin: 226 mg/dL (ref 212.0–360.0)

## 2022-06-08 MED ORDER — SUTAB 1479-225-188 MG PO TABS: ORAL_TABLET | ORAL | 0 refills | Status: DC

## 2022-06-08 NOTE — Telephone Encounter (Signed)
PVD (peripheral vascular disease) ([place])  Can hold plavix for colonoscopy for 5 days and restart same day if no biopsy otherwise in 3-5 days post procedure. Low risk from cardiac standpoint.    Adrian Prows, MD, [facility] 06/08/2022, 11:01 PM Office: [phone] Fax: [phone] Pager: [phone]

## 2022-06-08 NOTE — Telephone Encounter (Signed)
   Shane Valentine 1965-12-12 [id number]  Dear [name]:  We have scheduled the above named patient for a(n) Colonoscopy and Endoscopy  procedure. Our records show that (s)he is on anticoagulation therapy.  Please advise as to whether the patient may come off their therapy of Plavix 5 days prior to their procedure which is scheduled for 07/16/22.  Please route your response to [facility] or fax response to [phone].  Sincerely,    [facility]

## 2022-06-08 NOTE — Patient Instructions (Addendum)
   If you are age 57 or older, your body mass index should be between 23-30. Your Body mass index is 27.17 kg/m. If this is out of the aforementioned range listed, please consider follow up with your Primary Care Provider.  If you are age 48 or younger, your body mass index should be between 19-25. Your Body mass index is 27.17 kg/m. If this is out of the aformentioned range listed, please consider follow up with your Primary Care Provider.   ________________________________________________________  The [facility] providers would like to encourage you to use [facility] to communicate with providers for non-urgent requests or questions.  Due to long hold times on the telephone, sending your provider a message by [facility] may be a faster and more efficient way to get a response.  Please allow 48 business hours for a response.  Please remember that this is for non-urgent requests.   You have been scheduled for an endoscopy and colonoscopy. Please follow the written instructions given to you at your visit today. Please pick up your prep supplies at the pharmacy within the next 1-3 days. If you use inhalers (even only as needed), please bring them with you on the day of your procedure.   Your provider has requested that you go to the basement level for lab work before leaving today. Press "B" on the elevator. The lab is located at the first door on the left as you exit the elevator.   Take Asprin 81 mg while holding Plavix  Due to recent changes in healthcare laws, you may see the results of your imaging and laboratory studies on MyChart before your provider has had a chance to review them.  We understand that in some cases there may be results that are confusing or concerning to you. Not all laboratory results come back in the same time frame and the provider may be waiting for multiple results in order to interpret others.  Please give [place] 48 hours in order for your provider to thoroughly review all the  results before contacting the office for clarification of your results.   Marland Kitchen

## 2022-06-08 NOTE — Progress Notes (Signed)
HPI : Shane Valentine is a very pleasant 56 year old male with a history of stroke, chronic tobacco use and peripheral artery disease who is referred to [place] by Chana Bode, PA-C for further evaluation of mild anemia.  The patient's most recent hemoglobin was 12.6 last month.  This is stable from January of this year when it was 12.3.  His baseline hemoglobin is closer to 13-14. Most recent MCV was 98, no history of microcytosis.  A serum iron level was slightly low at 31.  A full iron panel was not obtained.  B12 and folate levels were within normal limits. He has never had a colonoscopy.  No family history of colon cancer.  He denies any chronic GI symptoms currently.  He has regular bowel movements with formed brown stools, although his stools have been dark since he started taking an iron supplement about a month ago.  No problems with constipation, diarrhea or abdominal pain. His weight is stable.  He had lost 5 pounds earlier in the year, but since regained.  He has a history of typical GERD symptoms going back at least 20 years.  He used to take Tums very frequently, but now he only has symptoms with certain foods.  He has infrequent symptoms currently and has not taken Tums in quite some time.  He had a stroke 7 years ago, but does not have any permanent neurologic deficits.  He underwent an atherectomy earlier this year for claudication with improvement in his pain.  He takes Plavix daily.  He has a prescription for aspirin, but he was told not to take it (per patient).  He has no history of heart disease, and denies any concerning symptoms such as chest pain/pressure, shortness of breath, lightheadedness/dizziness or palpitations.   Past Medical History:  Diagnosis Date   Arthritis    Genital warts    GERD (gastroesophageal reflux disease)    Headache    Hyperlipidemia    Hypertension    Paresthesia of hand, bilateral    Peripheral vascular disease ([place])    Smoker    Stroke [facility])     Tuberculosis 1995   55motherapy, [place]     Past Surgical History:  Procedure Laterality Date   INGUINAL HERNIA REPAIR Left 03/29/2014   Procedure: LAPAROSCOPIC LEFT INGUINAL HERNIA REPAIR;  Surgeon: ARalene Ok MD;  [facility] [facility]  Service: General;  Laterality: Left;   INSERTION OF MESH Left 03/29/2014   Procedure: INSERTION OF MESH;  Surgeon: ARalene Ok MD;  [place] [facility]  Service: General;  Laterality: Left;   LOWER EXTREMITY ANGIOGRAPHY N/A 08/11/2021   Procedure: LOWER EXTREMITY ANGIOGRAPHY;  Surgeon: GAdrian Prows MD;  [facility];  Service: Cardiovascular;  Laterality: N/A;   PERIPHERAL VASCULAR ATHERECTOMY Left 08/11/2021   Procedure: PERIPHERAL VASCULAR ATHERECTOMY;  Surgeon: GAdrian Prows MD;  [facility];  Service: Cardiovascular;  Laterality: Left;   TUMOR EXCISION Bilateral 03/21/2020   Procedure: TUMOR EXCISION RECTAL, anal condyloma;  Surgeon: RRonny Bacon MD;  Location: [facility];  Service: General;  Laterality: Bilateral;   Family History  Problem Relation Age of Onset   Diabetes Mother        amputations   Hypertension Mother    Cancer Father    COPD Brother    Cerebral aneurysm Brother    Heart disease Neg Hx    Stroke Neg Hx    Social History   Tobacco Use   Smoking status: Every Day  Packs/day: 0.50    Years: 23.00    Total pack years: 11.50    Types: Cigarettes   Smokeless tobacco: Never  Vaping Use   Vaping Use: Never used  Substance Use Topics   Alcohol use: No   Drug use: Yes    Types: Marijuana   Current Outpatient Medications  Medication Sig Dispense Refill   amLODipine (NORVASC) 5 MG tablet Take 1 tablet (5 mg total) by mouth daily. 90 tablet 01   aspirin EC 81 MG tablet Take 1 tablet (81 mg total) by mouth daily. 90 tablet 3   clopidogrel (PLAVIX) 75 MG tablet Take 75 mg by mouth daily.     ferrous gluconate (FERGON) 324 MG tablet Take 1 tablet (324 mg total) by mouth daily with  breakfast. 90 tablet 1   lisinopril-hydrochlorothiazide (ZESTORETIC) 20-25 MG tablet Take 1 tablet by mouth daily. 90 tablet 3   promethazine-dextromethorphan (PROMETHAZINE-DM) 6.25-15 MG/5ML syrup Take 5 mLs by mouth 4 (four) times daily as needed for cough. 118 mL 0   rosuvastatin (CRESTOR) 20 MG tablet Take 1 tablet (20 mg total) by mouth daily. 90 tablet 0   spironolactone (ALDACTONE) 50 MG tablet Take 50 mg by mouth daily.     No current facility-administered medications for this visit.   No Known Allergies   Review of Systems: All systems reviewed and negative except where noted in HPI.    No results found.  Physical Exam: BP 122/80   Pulse 86   Ht '5\' 9"'$  (1.753 m)   Wt 184 lb (83.5 kg)   BMI 27.17 kg/m  Constitutional: Pleasant,well-developed, African-American male in no acute distress. HEENT: Normocephalic and atraumatic.  Edentulous maxilla.  Conjunctivae are normal. No scleral icterus. Neck supple.  Cardiovascular: Normal rate, regular rhythm.  Pulmonary/chest: Effort normal and breath sounds normal. No wheezing, rales or rhonchi. Abdominal: Soft, nondistended, nontender. Bowel sounds active throughout. There are no masses palpable. No hepatomegaly. Extremities: no edema Neurological: Alert and oriented to person place and time. Skin: Skin is warm and dry. No rashes noted. Psychiatric: Normal mood and affect. Behavior is normal.  CBC    Component Value Date/Time   WBC 10.8 04/27/2022 0918   WBC 8.1 07/21/2021 0738   RBC 4.05 (L) 04/27/2022 0918   RBC 3.93 (L) 07/21/2021 0738   HGB 12.6 (L) 04/27/2022 0918   HCT 39.5 04/27/2022 0918   PLT 169 04/27/2022 0918   MCV 98 (H) 04/27/2022 0918   MCH 31.1 04/27/2022 0918   MCH 31.3 07/21/2021 0738   MCHC 31.9 04/27/2022 0918   MCHC 32.7 07/21/2021 0738   RDW 12.3 04/27/2022 0918   LYMPHSABS 2.4 04/24/2021 1301   MONOABS 0.5 03/20/2020 0846   EOSABS 0.2 04/24/2021 1301   BASOSABS 0.0 04/24/2021 1301    CMP      Component Value Date/Time   NA 143 04/27/2022 0918   K 4.1 04/27/2022 0918   CL 105 04/27/2022 0918   CO2 23 04/27/2022 0918   GLUCOSE 106 (H) 04/27/2022 0918   GLUCOSE 96 07/21/2021 0738   BUN 11 04/27/2022 0918   CREATININE 1.05 04/27/2022 0918   CREATININE 1.18 01/07/2015 0001   CALCIUM 9.4 04/27/2022 0918   PROT 6.4 04/27/2022 0918   ALBUMIN 4.4 04/27/2022 0918   AST 17 04/27/2022 0918   ALT 17 04/27/2022 0918   ALKPHOS 85 04/27/2022 0918   BILITOT <0.2 04/27/2022 0918   GFRNONAA >60 07/21/2021 0738   GFRAA >60 03/20/2020 0846   Component  Ref Range & Units 1 mo ago  Iron 38 - 169 ug/dL 31 Low    Component Ref Range & Units 1 mo ago 4 yr ago  Vitamin B-12 232 - 1,245 pg/mL 512 469    ASSESSMENT AND PLAN: 56 year old male with mild normocytic anemia, with low serum iron.  This is not definitive for iron deficiency anemia.  We will obtain full iron panel to further evaluate for iron deficiency.  The patient is overdue for his initial average rescreening colonoscopy.  If his iron indices are consistent with iron deficiency anemia, I would recommend we also add an upper endoscopy, especially given his heavy smoking history. Recommend patient hold his Plavix for 5 days prior to his colonoscopy.  We will message his cardiologist to ensure there are no objections to this.  I recommended he take a baby aspirin while he is holding his Plavix, as this will still provide some stroke prevention but not increase his risk of bleeding from a polypectomy.   Anemia, normocytic - Iron panel - Add EGD if iron panel confirms iron deficiency  Colon cancer screening - Colonoscopy  History of stroke/peripheral artery disease - Hold Plavix 5 days prior to procedure; okay to take '81mg'$  ASA while holding Plavix  The details, risks (including bleeding, perforation, infection, missed lesions, medication reactions and possible hospitalization or surgery if complications occur), benefits, and  alternatives to colonoscopy with possible biopsy and possible polypectomy were discussed with the patient and he/she consents to proceed.   Brilynn Biasi E. Candis Schatz, MD [facility]  CC:  Glade Lloyd Camelia Eng, PA-C

## 2022-06-09 NOTE — Telephone Encounter (Signed)
Contacted patient and informed him to hold Plavix 5 days prior to his procedure and reminded him to to aspirin while holding Plavix.

## 2022-06-12 NOTE — Progress Notes (Signed)
Vaughan Basta,  Please let Mr. Shane Valentine know that his iron levels were not consistent with iron deficiency anemia.  He can stop taking the oral iron tablets and plan for a colonoscopy as previously discussed.

## 2022-07-08 ENCOUNTER — Encounter: Payer: Self-pay | Admitting: Gastroenterology

## 2022-07-14 ENCOUNTER — Telehealth: Payer: Self-pay | Admitting: Gastroenterology

## 2022-07-14 NOTE — Telephone Encounter (Signed)
Patient called to reschedule his procedure for 12/29. He is sick with a cold. Reschedule for 2/8 at 3:30 pm.

## 2022-07-16 ENCOUNTER — Encounter: Payer: BC Managed Care – PPO | Admitting: Gastroenterology

## 2022-07-26 ENCOUNTER — Ambulatory Visit: Payer: BC Managed Care – PPO

## 2022-07-26 DIAGNOSIS — I351 Nonrheumatic aortic (valve) insufficiency: Secondary | ICD-10-CM | POA: Diagnosis not present

## 2022-07-26 DIAGNOSIS — I739 Peripheral vascular disease, unspecified: Secondary | ICD-10-CM | POA: Diagnosis not present

## 2022-07-26 DIAGNOSIS — I77810 Thoracic aortic ectasia: Secondary | ICD-10-CM

## 2022-07-27 ENCOUNTER — Encounter: Payer: Self-pay | Admitting: Medical

## 2022-07-27 ENCOUNTER — Telehealth: Payer: Self-pay | Admitting: Medical

## 2022-07-27 NOTE — Telephone Encounter (Signed)
error 

## 2022-07-27 NOTE — Progress Notes (Signed)
ABI 07/26/2022: This exam reveals normal perfusion of the right lower extremity (ABI 1.13)  with normal triphasic waveform at DP and mildly abnormal biphasic waveform at PT level.   This exam reveals normal perfusion of the left lower extremity (ABI 1.21)  with normal triphasic waveform at DP and mildly abnormal biphasic waveform at PT level.    No significant change from 08/28/2021 and suggests patency of the left SFA angioplasty.   Discuss on OV soon

## 2022-07-29 ENCOUNTER — Ambulatory Visit (AMBULATORY_SURGERY_CENTER): Payer: BC Managed Care – PPO | Admitting: *Deleted

## 2022-07-29 VITALS — Ht 69.5 in | Wt 183.0 lb

## 2022-07-29 DIAGNOSIS — D649 Anemia, unspecified: Secondary | ICD-10-CM

## 2022-07-29 DIAGNOSIS — Z1211 Encounter for screening for malignant neoplasm of colon: Secondary | ICD-10-CM

## 2022-07-29 NOTE — Progress Notes (Signed)
No egg or soy allergy known to patient  No issues known to pt with past sedation with any surgeries or procedures Patient denies ever being told they had issues or difficulty with intubation  No FH of Malignant Hyperthermia Pt is not on diet pills Pt is not on  home 02  Pt is  on PLAVIX blood thinners  Pt denies issues with constipation  No A fib or A flutter Have any cardiac testing pending--NO Pt instructed to use Singlecare.com or GoodRx for a price reduction on prep    Pt. Already has sutab at home from previous appointment.  Patient's chart reviewed by Osvaldo Angst CNRA prior to previsit and patient appropriate for the [place].  Previsit completed and red dot placed by patient's name on their procedure day (on provider's schedule).

## 2022-07-31 ENCOUNTER — Other Ambulatory Visit: Payer: Self-pay | Admitting: Medical

## 2022-08-05 ENCOUNTER — Ambulatory Visit: Payer: BC Managed Care – PPO | Admitting: Cardiology

## 2022-08-18 ENCOUNTER — Encounter: Payer: Self-pay | Admitting: Gastroenterology

## 2022-08-19 HISTORY — PX: COLONOSCOPY: SHX174

## 2022-08-19 HISTORY — PX: ESOPHAGOGASTRODUODENOSCOPY: SHX1529

## 2022-08-26 ENCOUNTER — Encounter: Payer: Self-pay | Admitting: Gastroenterology

## 2022-08-26 ENCOUNTER — Ambulatory Visit (AMBULATORY_SURGERY_CENTER): Payer: BC Managed Care – PPO | Admitting: Gastroenterology

## 2022-08-26 VITALS — BP 139/91 | HR 59 | Temp 98.9°F | Resp 18 | Ht 69.5 in | Wt 183.0 lb

## 2022-08-26 DIAGNOSIS — Z1211 Encounter for screening for malignant neoplasm of colon: Secondary | ICD-10-CM

## 2022-08-26 DIAGNOSIS — K2270 Barrett's esophagus without dysplasia: Secondary | ICD-10-CM | POA: Diagnosis not present

## 2022-08-26 DIAGNOSIS — D649 Anemia, unspecified: Secondary | ICD-10-CM

## 2022-08-26 DIAGNOSIS — K2950 Unspecified chronic gastritis without bleeding: Secondary | ICD-10-CM | POA: Diagnosis not present

## 2022-08-26 DIAGNOSIS — D125 Benign neoplasm of sigmoid colon: Secondary | ICD-10-CM

## 2022-08-26 DIAGNOSIS — D123 Benign neoplasm of transverse colon: Secondary | ICD-10-CM

## 2022-08-26 MED ORDER — SODIUM CHLORIDE 0.9 % IV SOLN: 500.0000 mL | Freq: Once | INTRAVENOUS | Status: DC

## 2022-08-26 NOTE — Progress Notes (Signed)
Called to room to assist during endoscopic procedure.  Patient ID and intended procedure confirmed with present staff. Received instructions for my participation in the procedure from the performing physician.  

## 2022-08-26 NOTE — Progress Notes (Signed)
[facility] History and Physical   Primary Care Physician:  Carlena Hurl, PA-C   Reason for Procedure:   Iron deficiency anemia  Plan:    EGD, Colonoscopy     HPI: Shane Valentine is a 57 y.o. male undergoing EGD and colonoscopy to evaluate mild iron deficiency anemia.  He has no chronic GI symptoms. He thinks his sister may have had colon cancer.  This is his first EGD and colonoscopy.   Past Medical History:  Diagnosis Date   Arthritis    Genital warts    GERD (gastroesophageal reflux disease)    Headache    Hyperlipidemia    Hypertension    Paresthesia of hand, bilateral    Peripheral vascular disease ([place])    Smoker    Stroke [facility])    Tuberculosis 1995   47motherapy,     Past Surgical History:  Procedure Laterality Date   COLONOSCOPY     INGUINAL HERNIA REPAIR Left 03/29/2014   Procedure: LAPAROSCOPIC LEFT INGUINAL HERNIA REPAIR;  Surgeon: ARalene Ok MD;  Location: [facility]  Service: General;  Laterality: Left;   INSERTION OF MESH Left 03/29/2014   Procedure: INSERTION OF MESH;  Surgeon: ARalene Ok MD;  Location: [facility]  Service: General;  Laterality: Left;   LOWER EXTREMITY ANGIOGRAPHY N/A 08/11/2021   Procedure: LOWER EXTREMITY ANGIOGRAPHY;  Surgeon: GAdrian Prows MD;  [facility];  Service: Cardiovascular;  Laterality: N/A;   PERIPHERAL VASCULAR ATHERECTOMY Left 08/11/2021   Procedure: PERIPHERAL VASCULAR ATHERECTOMY;  Surgeon: GAdrian Prows MD;  [place] [facility];  Service: Cardiovascular;  Laterality: Left;   TUMOR EXCISION Bilateral 03/21/2020   Procedure: TUMOR EXCISION RECTAL, anal condyloma;  Surgeon: RRonny Bacon MD;  Location: [facility];  Service: General;  Laterality: Bilateral;   UPPER GASTROINTESTINAL ENDOSCOPY      Prior to Admission medications   Medication Sig Start Date End Date Taking? Authorizing Provider  amLODipine (NORVASC) 5 MG tablet Take 1 tablet (5 mg total) by mouth daily.  04/21/22 04/16/23 Yes Tysinger, DCamelia Eng PA-C  clopidogrel (PLAVIX) 75 MG tablet Take 75 mg by mouth daily. 04/12/22  Yes [provider]  lisinopril-hydrochlorothiazide (ZESTORETIC) 20-25 MG tablet Take 1 tablet by mouth daily. 04/28/22  Yes Tysinger, DCamelia Eng PA-C  rosuvastatin (CRESTOR) 20 MG tablet Take 1 tablet (20 mg total) by mouth daily. 04/21/22  Yes Tysinger, DCamelia Eng PA-C  spironolactone (ALDACTONE) 50 MG tablet Take 50 mg by mouth daily.   Yes [provider]  aspirin EC 81 MG tablet Take 1 tablet (81 mg total) by mouth daily. Patient not taking: Reported on 06/08/2022 04/21/22 04/21/23  Tysinger, DCamelia Eng PA-C    Current Outpatient Medications  Medication Sig Dispense Refill   amLODipine (NORVASC) 5 MG tablet Take 1 tablet (5 mg total) by mouth daily. 90 tablet 01   clopidogrel (PLAVIX) 75 MG tablet Take 75 mg by mouth daily.     lisinopril-hydrochlorothiazide (ZESTORETIC) 20-25 MG tablet Take 1 tablet by mouth daily. 90 tablet 3   rosuvastatin (CRESTOR) 20 MG tablet Take 1 tablet (20 mg total) by mouth daily. 90 tablet 0   spironolactone (ALDACTONE) 50 MG tablet Take 50 mg by mouth daily.     aspirin EC 81 MG tablet Take 1 tablet (81 mg total) by mouth daily. (Patient not taking: Reported on 06/08/2022) 90 tablet 3   Current Facility-Administered Medications  Medication Dose Route Frequency Provider Last Rate Last Admin   0.9 %  sodium chloride infusion  500 mL Intravenous Once Daryel November, MD        Allergies as of 08/26/2022   (No Known Allergies)    Family History  Problem Relation Age of Onset   Diabetes Mother        amputations   Hypertension Mother    Cancer Father    COPD Brother    Cerebral aneurysm Brother    Heart disease Neg Hx    Stroke Neg Hx    Colon cancer Neg Hx    Colon polyps Neg Hx    Crohn's disease Neg Hx    Esophageal cancer Neg Hx    Rectal cancer Neg Hx    Stomach cancer Neg Hx    Ulcerative colitis Neg Hx      Social History   Socioeconomic History   Marital status: Single    Spouse name: Not on file   Number of children: 3   Years of education: Not on file   Highest education level: Not on file  Occupational History   Occupation: maintenance  Tobacco Use   Smoking status: Every Day    Packs/day: 0.50    Years: 23.00    Total pack years: 11.50    Types: Cigarettes   Smokeless tobacco: Never  Vaping Use   Vaping Use: Never used  Substance and Sexual Activity   Alcohol use: No   Drug use: Yes    Types: Marijuana    Comment: LAST SMOKED 07/28/22   Sexual activity: Never  Other Topics Concern   Not on file  Social History Narrative   Uses jump rope and some dumbbells regularly.   No significant other.   Lives alone.   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  12/2019   Social Determinants of Health   Financial Resource Strain: Not on file  Food Insecurity: Not on file  Transportation Needs: Not on file  Physical Activity: Not on file  Stress: Not on file  Social Connections: Not on file  Intimate Partner Violence: Not on file    Review of Systems:  All other review of systems negative except as mentioned in the HPI.  Physical Exam: Vital signs BP (!) 166/98   Pulse 67   Temp 98.9 F (37.2 C) (Temporal)   Resp 15   Ht 5' 9.5" (1.765 m)   Wt 183 lb (83 kg)   SpO2 99%   BMI 26.64 kg/m   General:   Alert,  Well-developed, well-nourished, pleasant and cooperative in NAD Airway:  Mallampati 1 Lungs:  Clear throughout to auscultation.   Heart:  Regular rate and rhythm; no murmurs, clicks, rubs,  or gallops. Abdomen:  Soft, nontender and nondistended. Normal bowel sounds.   Neuro/Psych:  Normal mood and affect. A and O x 3   [name] E. Candis Schatz, MD [facility]

## 2022-08-26 NOTE — Progress Notes (Signed)
VSS NAD TRANS TO PACU  

## 2022-08-26 NOTE — Op Note (Signed)
[place] Patient Name: Shane Valentine Procedure Date: 08/26/2022 3:24 PM MRN: [id number] Endoscopist: Nicki Reaper E. Candis Schatz , MD, [id number] Age: 57 Referring MD:  Date of Birth: [birth year] Gender: Male Account #: [id number] Procedure:                Colonoscopy Indications:              Iron deficiency anemia Medicines:                Monitored Anesthesia Care Procedure:                Pre-Anesthesia Assessment:                           - Prior to the procedure, a History and Physical                            was performed, and patient medications and                            allergies were reviewed. The patient's tolerance of                            previous anesthesia was also reviewed. The risks                            and benefits of the procedure and the sedation                            options and risks were discussed with the patient.                            All questions were answered, and informed consent                            was obtained. Prior Anticoagulants: The patient has                            taken Plavix (clopidogrel), last dose was 5 days                            prior to procedure. ASA Grade Assessment: III - A                            patient with severe systemic disease. After                            reviewing the risks and benefits, the patient was                            deemed in satisfactory condition to undergo the                            procedure.  After obtaining informed consent, the colonoscope                            was passed under direct vision. Throughout the                            procedure, the patient's blood pressure, pulse, and                            oxygen saturations were monitored continuously. The                            CF [id number] #[id number] was introduced through the anus                            and advanced to the the cecum, identified by                             appendiceal orifice and ileocecal valve. The                            colonoscopy was somewhat difficult due to a                            redundant colon and significant looping. Successful                            completion of the procedure was aided by using                            manual pressure and withdrawing and reinserting the                            scope. The patient tolerated the procedure well.                            The quality of the bowel preparation was adequate.                            The ileocecal valve, appendiceal orifice, and                            rectum were photographed. The bowel preparation                            used was SUTAB via split dose instruction. Scope In: 3:55:35 PM Scope Out: 4:25:30 PM Scope Withdrawal Time: 0 hours 22 minutes 31 seconds  Total Procedure Duration: 0 hours 29 minutes 55 seconds  Findings:                 The perianal and digital rectal examinations were                            normal. Pertinent negatives include normal  sphincter tone and no palpable rectal lesions.                           A 3 mm polyp was found in the transverse colon. The                            polyp was sessile. The polyp was removed with a                            cold snare. Resection and retrieval were complete.                            Estimated blood loss was minimal.                           Many flat and sessile polyps were found in the                            rectum and sigmoid colon. The polyps were                            diminutive in size. Three of these polyps were                            removed with a cold snare. Resection was complete,                            but the polyp tissue was not retrieved. Estimated                            blood loss was minimal.                           A few small-mouthed diverticula were found in the                             sigmoid colon and transverse colon.                           Non-bleeding internal hemorrhoids were found during                            retroflexion. The hemorrhoids were Grade I                            (internal hemorrhoids that do not prolapse).                           No additional abnormalities were found on                            retroflexion. Complications:            No immediate complications. Estimated Blood Loss:     Estimated blood  loss was minimal. Impression:               - One 3 mm polyp in the transverse colon, removed                            with a cold snare. Resected and retrieved.                           - Many diminutive polyps in the rectum and in the                            sigmoid colon, removed with a cold snare. Complete                            resection. Polyp tissue not retrieved. These were                            consistent with hyperplastic polyps.                           - Diverticulosis in the sigmoid colon and in the                            transverse colon.                           - Non-bleeding internal hemorrhoids. Recommendation:           - Patient has a contact number available for                            emergencies. The signs and symptoms of potential                            delayed complications were discussed with the                            patient. Return to normal activities tomorrow.                            Written discharge instructions were provided to the                            patient.                           - Resume previous diet.                           - Continue present medications.                           - Await pathology results.                           - Repeat colonoscopy (date not yet determined) for  surveillance based on pathology results.                           - Resume Plavix (clopidogrel) at prior dose                             tomorrow.                           - No abnormalities to explain iron deficiency anemia                           - Await biopsies from upper endoscopy. Otie Headlee E. Candis Schatz, MD 08/26/2022 4:35:38 PM This report has been signed electronically.

## 2022-08-26 NOTE — Op Note (Signed)
[place] Patient Name: Shane Valentine Procedure Date: 08/26/2022 3:36 PM MRN: [id number] Endoscopist: Nicki Reaper E. Candis Schatz , MD, [id number] Age: 57 Referring MD:  Date of Birth: 1966-04-29 Gender: Male Account #: [id number] Procedure:                Upper GI endoscopy Indications:              Iron deficiency anemia Medicines:                Monitored Anesthesia Care Procedure:                Pre-Anesthesia Assessment:                           - Prior to the procedure, a History and Physical                            was performed, and patient medications and                            allergies were reviewed. The patient's tolerance of                            previous anesthesia was also reviewed. The risks                            and benefits of the procedure and the sedation                            options and risks were discussed with the patient.                            All questions were answered, and informed consent                            was obtained. Prior Anticoagulants: The patient has                            taken Plavix (clopidogrel), last dose was 5 days                            prior to procedure. ASA Grade Assessment: III - A                            patient with severe systemic disease. After                            reviewing the risks and benefits, the patient was                            deemed in satisfactory condition to undergo the                            procedure.  After obtaining informed consent, the endoscope was                            passed under direct vision. Throughout the                            procedure, the patient's blood pressure, pulse, and                            oxygen saturations were monitored continuously. The                            Olympus Scope [id number] was introduced through the                            mouth, and advanced to the second part of duodenum.                             The upper GI endoscopy was accomplished without                            difficulty. The patient tolerated the procedure                            well. Scope In: Scope Out: Findings:                 One tongue of salmon-colored mucosa was present                            from 36 to 38 cm. No other visible abnormalities                            were present. The maximum longitudinal extent of                            these esophageal mucosal changes was 2 cm in                            length. Biopsies were taken with a cold forceps for                            histology. Estimated blood loss was minimal.                           A few polyps were found at the gastroesophageal                            junction. Biopsies were taken with a cold forceps                            for histology. Estimated blood loss was minimal.  The exam of the esophagus was otherwise normal.                           A 3 cm hiatal hernia was present.                           Diffuse granular mucosa was found in the entire                            examined stomach. Biopsies were taken with a cold                            forceps for Helicobacter pylori testing. Estimated                            blood loss was minimal.                           The exam of the stomach was otherwise normal.                           The examined duodenum was normal. Biopsies for                            histology were taken with a cold forceps for                            evaluation of celiac disease. Estimated blood loss                            was minimal. Complications:            No immediate complications. Estimated Blood Loss:     Estimated blood loss was minimal. Impression:               - Salmon-colored mucosa suspicious for                            short-segment Barrett's esophagus. Biopsied.                           -  Gastroesophageal junction polyp(s) were found.                            Biopsied. These are likely inflammatory.                           - 3 cm hiatal hernia.                           - Granular gastric mucosa. Biopsied.                           - Normal examined duodenum. Biopsied. Recommendation:           - Patient has a contact number available for  emergencies. The signs and symptoms of potential                            delayed complications were discussed with the                            patient. Return to normal activities tomorrow.                            Written discharge instructions were provided to the                            patient.                           - Resume previous diet.                           - Continue present medications.                           - Await pathology results.                           - Further recommendations will be based on                            pathology results. Laquasia Pincus E. Candis Schatz, MD 08/26/2022 4:28:06 PM This report has been signed electronically.

## 2022-08-26 NOTE — Patient Instructions (Addendum)
Resume your plavix tomorrow.  Resume your regular medications today.  Read all of the handouts given to you by your recovery room nurse.  You will probably need another colonoscopy in 7 years.  YOU HAD AN ENDOSCOPIC PROCEDURE TODAY AT [facility]:   Refer to the procedure report that was given to you for any specific questions about what was found during the examination.  If the procedure report does not answer your questions, please call your gastroenterologist to clarify.  If you requested that your care partner not be given the details of your procedure findings, then the procedure report has been included in a sealed envelope for you to review at your convenience later.  YOU SHOULD EXPECT: Some feelings of bloating in the abdomen. Passage of more gas than usual.  Walking can help get rid of the air that was put into your GI tract during the procedure and reduce the bloating. If you had a lower endoscopy (such as a colonoscopy or flexible sigmoidoscopy) you may notice spotting of blood in your stool or on the toilet paper. If you underwent a bowel prep for your procedure, you may not have a normal bowel movement for a few days.  Please Note:  You might notice some irritation and congestion in your nose or some drainage.  This is from the oxygen used during your procedure.  There is no need for concern and it should clear up in a day or so.  SYMPTOMS TO REPORT IMMEDIATELY:  Following lower endoscopy (colonoscopy or flexible sigmoidoscopy):  Excessive amounts of blood in the stool  Significant tenderness or worsening of abdominal pains  Swelling of the abdomen that is new, acute  Fever of 100F or higher  Following upper endoscopy (EGD)  Vomiting of blood or coffee ground material  New chest pain or pain under the shoulder blades  Painful or persistently difficult swallowing  New shortness of breath  Fever of 100F or higher  Black, tarry-looking stools  For urgent or  emergent issues, a gastroenterologist can be reached at any hour by calling [phone]. Do not use MyChart messaging for urgent concerns.    DIET:  We do recommend a small meal at first, but then you may proceed to your regular diet.  Drink plenty of fluids but you should avoid alcoholic beverages for 24 hours.  ACTIVITY:  You should plan to take it easy for the rest of today and you should NOT DRIVE or use heavy machinery until tomorrow (because of the sedation medicines used during the test).    FOLLOW UP: Our staff will call the number listed on your records the next business day following your procedure.  We will call around 7:15- 8:00 am to check on you and address any questions or concerns that you may have regarding the information given to you following your procedure. If we do not reach you, we will leave a message.     If any biopsies were taken you will be contacted by phone or by letter within the next 1-3 weeks.  Please call us at [phone] if you have not heard about the biopsies in 3 weeks.    SIGNATURES/CONFIDENTIALITY: You and/or your care partner have signed paperwork which will be entered into your electronic medical record.  These signatures attest to the fact that that the information above on your After Visit Summary has been reviewed and is understood.  Full responsibility of the confidentiality of this discharge information lies with  you and/or your care-partner.

## 2022-08-26 NOTE — Progress Notes (Signed)
Pt's states no medical or surgical changes since previsit or office visit. 

## 2022-08-27 ENCOUNTER — Telehealth: Payer: Self-pay | Admitting: *Deleted

## 2022-08-27 NOTE — Telephone Encounter (Signed)
  Follow up Call-     08/26/2022    2:24 PM  Call back number  Post procedure Call Back phone  # [phone]  Permission to leave phone message No     Patient questions:  Do you have a fever, pain , or abdominal swelling? No. Pain Score  0 *  Have you tolerated food without any problems? Yes.    Have you been able to return to your normal activities? Yes.    Do you have any questions about your discharge instructions: Diet   No. Medications  No. Follow up visit  No.  Do you have questions or concerns about your Care? No.  Actions: * If pain score is 4 or above: No action needed, pain <4.

## 2022-09-05 ENCOUNTER — Encounter: Payer: Self-pay | Admitting: Gastroenterology

## 2022-09-06 ENCOUNTER — Other Ambulatory Visit: Payer: Self-pay

## 2022-09-06 ENCOUNTER — Telehealth: Payer: Self-pay

## 2022-09-06 DIAGNOSIS — D649 Anemia, unspecified: Secondary | ICD-10-CM

## 2022-09-06 MED ORDER — PANTOPRAZOLE SODIUM 40 MG PO TBEC: 40.0000 mg | DELAYED_RELEASE_TABLET | Freq: Every day | ORAL | 3 refills | Status: DC

## 2022-09-06 NOTE — Telephone Encounter (Signed)
-----   Message from Daryel November, MD sent at 09/05/2022  5:51 AM EST ----- Regarding: labs, protonix script Vaugh[facility] Basta,  Can you please place an order for CBC and iron panel and a script for Protonix 20 mg PO daily #90 [id number]?

## 2022-09-06 NOTE — Telephone Encounter (Signed)
Orders entered and script sent to pharmacy as requested.

## 2022-10-24 ENCOUNTER — Encounter (HOSPITAL_COMMUNITY)
Admission: EM | Disposition: A | Discharge status: Home / Self Care | Payer: Self-pay | Source: Home / Self Care | Attending: Internal Medicine

## 2022-10-24 ENCOUNTER — Inpatient Hospital Stay (HOSPITAL_COMMUNITY): Payer: BC Managed Care – PPO

## 2022-10-24 ENCOUNTER — Other Ambulatory Visit: Payer: Self-pay

## 2022-10-24 ENCOUNTER — Inpatient Hospital Stay (HOSPITAL_COMMUNITY)
Admission: EM | Admit: 2022-10-24 | Discharge: 2022-10-27 | DRG: 915 | Disposition: A | Discharge status: Home / Self Care | Payer: BC Managed Care – PPO | Attending: Family Medicine | Admitting: Family Medicine

## 2022-10-24 ENCOUNTER — Encounter (HOSPITAL_COMMUNITY): Payer: Self-pay

## 2022-10-24 ENCOUNTER — Emergency Department (HOSPITAL_COMMUNITY): Payer: BC Managed Care – PPO

## 2022-10-24 ENCOUNTER — Inpatient Hospital Stay (HOSPITAL_COMMUNITY): Payer: BC Managed Care – PPO | Admitting: Certified Registered Nurse Anesthetist

## 2022-10-24 DIAGNOSIS — Z4659 Encounter for fitting and adjustment of other gastrointestinal appliance and device: Secondary | ICD-10-CM | POA: Diagnosis not present

## 2022-10-24 DIAGNOSIS — E785 Hyperlipidemia, unspecified: Secondary | ICD-10-CM | POA: Diagnosis present

## 2022-10-24 DIAGNOSIS — Z7982 Long term (current) use of aspirin: Secondary | ICD-10-CM

## 2022-10-24 DIAGNOSIS — I10 Essential (primary) hypertension: Secondary | ICD-10-CM

## 2022-10-24 DIAGNOSIS — T783XXA Angioneurotic edema, initial encounter: Secondary | ICD-10-CM | POA: Diagnosis not present

## 2022-10-24 DIAGNOSIS — J9601 Acute respiratory failure with hypoxia: Secondary | ICD-10-CM | POA: Diagnosis present

## 2022-10-24 DIAGNOSIS — I739 Peripheral vascular disease, unspecified: Secondary | ICD-10-CM | POA: Diagnosis not present

## 2022-10-24 DIAGNOSIS — T4275XA Adverse effect of unspecified antiepileptic and sedative-hypnotic drugs, initial encounter: Secondary | ICD-10-CM | POA: Diagnosis not present

## 2022-10-24 DIAGNOSIS — R918 Other nonspecific abnormal finding of lung field: Secondary | ICD-10-CM | POA: Diagnosis not present

## 2022-10-24 DIAGNOSIS — T464X5A Adverse effect of angiotensin-converting-enzyme inhibitors, initial encounter: Secondary | ICD-10-CM | POA: Diagnosis present

## 2022-10-24 DIAGNOSIS — N179 Acute kidney failure, unspecified: Secondary | ICD-10-CM | POA: Diagnosis not present

## 2022-10-24 DIAGNOSIS — F17200 Nicotine dependence, unspecified, uncomplicated: Secondary | ICD-10-CM | POA: Diagnosis not present

## 2022-10-24 DIAGNOSIS — F17210 Nicotine dependence, cigarettes, uncomplicated: Secondary | ICD-10-CM | POA: Diagnosis present

## 2022-10-24 DIAGNOSIS — K219 Gastro-esophageal reflux disease without esophagitis: Secondary | ICD-10-CM | POA: Diagnosis not present

## 2022-10-24 DIAGNOSIS — Z79899 Other long term (current) drug therapy: Secondary | ICD-10-CM

## 2022-10-24 DIAGNOSIS — J9811 Atelectasis: Secondary | ICD-10-CM | POA: Diagnosis not present

## 2022-10-24 DIAGNOSIS — Z430 Encounter for attention to tracheostomy: Secondary | ICD-10-CM | POA: Diagnosis not present

## 2022-10-24 HISTORY — PX: INTUBATION-ENDOTRACHEAL WITH TRACHEOSTOMY STANDBY: SHX6592

## 2022-10-24 LAB — COMPREHENSIVE METABOLIC PANEL
ALT: 18 U/L (ref 0–44)
AST: 27 U/L (ref 15–41)
Albumin: 3.9 g/dL (ref 3.5–5.0)
Alkaline Phosphatase: 73 U/L (ref 38–126)
Anion gap: 13 (ref 5–15)
BUN: 21 mg/dL — ABNORMAL HIGH (ref 6–20)
CO2: 24 mmol/L (ref 22–32)
Calcium: 9.5 mg/dL (ref 8.9–10.3)
Chloride: 101 mmol/L (ref 98–111)
Creatinine, Ser: 1.28 mg/dL — ABNORMAL HIGH (ref 0.61–1.24)
GFR, Estimated: >60 mL/min (ref >60)
Glucose, Bld: 98 mg/dL (ref 70–99)
Potassium: 4.2 mmol/L (ref 3.5–5.1)
Sodium: 138 mmol/L (ref 135–145)
Total Bilirubin: 1.3 mg/dL — ABNORMAL HIGH (ref 0.3–1.2)
Total Protein: 6.7 g/dL (ref 6.5–8.1)

## 2022-10-24 LAB — HEMOGLOBIN A1C
Hgb A1c MFr Bld: 5.5 % (ref 4.8–5.6)
Mean Plasma Glucose: 111.15 mg/dL

## 2022-10-24 LAB — CBC WITH DIFFERENTIAL/PLATELET
Abs Immature Granulocytes: 0.04 10*3/uL (ref 0.00–0.07)
Basophils Absolute: 0 10*3/uL (ref 0.0–0.1)
Basophils Relative: 0 %
Eosinophils Absolute: 0.1 10*3/uL (ref 0.0–0.5)
Eosinophils Relative: 1 %
HCT: 46.2 % (ref 39.0–52.0)
Hemoglobin: 14.3 g/dL (ref 13.0–17.0)
Immature Granulocytes: 0 %
Lymphocytes Relative: 25 %
Lymphs Abs: 2.5 10*3/uL (ref 0.7–4.0)
MCH: 31 pg (ref 26.0–34.0)
MCHC: 31 g/dL (ref 30.0–36.0)
MCV: 100 fL (ref 80.0–100.0)
Monocytes Absolute: 0.8 10*3/uL (ref 0.1–1.0)
Monocytes Relative: 8 %
Neutro Abs: 6.5 10*3/uL (ref 1.7–7.7)
Neutrophils Relative %: 66 %
Platelets: 183 10*3/uL (ref 150–400)
RBC: 4.62 MIL/uL (ref 4.22–5.81)
RDW: 13.2 % (ref 11.5–15.5)
WBC: 10 10*3/uL (ref 4.0–10.5)
nRBC: 0 % (ref 0.0–0.2)

## 2022-10-24 LAB — GLUCOSE, CAPILLARY
Glucose-Capillary: 147 mg/dL — ABNORMAL HIGH (ref 70–99)
Glucose-Capillary: 125 mg/dL — ABNORMAL HIGH (ref 70–99)
Glucose-Capillary: 129 mg/dL — ABNORMAL HIGH (ref 70–99)
Glucose-Capillary: 130 mg/dL — ABNORMAL HIGH (ref 70–99)

## 2022-10-24 LAB — MRSA NEXT GEN BY PCR, NASAL: MRSA by PCR Next Gen: NOT DETECTED

## 2022-10-24 LAB — CULTURE, RESPIRATORY W GRAM STAIN

## 2022-10-24 LAB — HIV ANTIBODY (ROUTINE TESTING W REFLEX): HIV Screen 4th Generation wRfx: NONREACTIVE

## 2022-10-24 SURGERY — INTUBATION-ENDOTRACHEAL WITH TRACHEOSTOMY STANDBY
Anesthesia: General

## 2022-10-24 MED ORDER — FAMOTIDINE IN NACL 20-0.9 MG/50ML-% IV SOLN
20.0000 mg | Freq: Once | INTRAVENOUS | Status: AC
  Administered 2022-10-24: 20 mg via INTRAVENOUS
  Filled 2022-10-24: qty 50

## 2022-10-24 MED ORDER — LIDOCAINE HCL 4 % EX SOLN
Freq: Three times a day (TID) | CUTANEOUS | Status: DC | PRN
  Administered 2022-10-24: 4 mL via TOPICAL
  Filled 2022-10-24: qty 50

## 2022-10-24 MED ORDER — DEXMEDETOMIDINE HCL IN NACL 80 MCG/20ML IV SOLN
INTRAVENOUS | Status: DC | PRN
  Administered 2022-10-24: 12 ug via INTRAVENOUS
  Administered 2022-10-24 (×2): 8 ug via INTRAVENOUS

## 2022-10-24 MED ORDER — MIDAZOLAM HCL 2 MG/2ML IJ SOLN
INTRAMUSCULAR | Status: DC | PRN
  Administered 2022-10-24: 2 mg via INTRAVENOUS

## 2022-10-24 MED ORDER — TRANEXAMIC ACID-NACL 1000-0.7 MG/100ML-% IV SOLN
1000.0000 mg | Freq: Once | INTRAVENOUS | Status: AC
  Administered 2022-10-24: 1000 mg via INTRAVENOUS
  Filled 2022-10-24: qty 100

## 2022-10-24 MED ORDER — SODIUM CHLORIDE 0.9 % IV SOLN
250.0000 mL | INTRAVENOUS | Status: DC
  Administered 2022-10-24: 250 mL via INTRAVENOUS

## 2022-10-24 MED ORDER — FENTANYL 2500MCG IN NS 250ML (10MCG/ML) PREMIX INFUSION
50.0000 ug/h | INTRAVENOUS | Status: DC
  Administered 2022-10-24: 150 ug/h via INTRAVENOUS
  Administered 2022-10-24: 50 ug/h via INTRAVENOUS
  Filled 2022-10-24 (×2): qty 250

## 2022-10-24 MED ORDER — KETAMINE HCL 50 MG/5ML IJ SOSY
PREFILLED_SYRINGE | INTRAMUSCULAR | Status: AC
  Filled 2022-10-24: qty 10

## 2022-10-24 MED ORDER — FENTANYL BOLUS VIA INFUSION: 50.0000 ug | INTRAVENOUS | Status: DC | PRN

## 2022-10-24 MED ORDER — OXYMETAZOLINE HCL 0.05 % NA SOLN
NASAL | Status: AC
  Filled 2022-10-24: qty 30

## 2022-10-24 MED ORDER — PROPOFOL 10 MG/ML IV BOLUS
INTRAVENOUS | Status: DC | PRN
  Administered 2022-10-24: 50 mg via INTRAVENOUS

## 2022-10-24 MED ORDER — DOCUSATE SODIUM 100 MG PO CAPS: 100.0000 mg | ORAL_CAPSULE | Freq: Two times a day (BID) | ORAL | Status: DC | PRN

## 2022-10-24 MED ORDER — POLYETHYLENE GLYCOL 3350 17 G PO PACK: 17.0000 g | PACK | Freq: Every day | ORAL | Status: DC | PRN

## 2022-10-24 MED ORDER — ORAL CARE MOUTH RINSE: 15.0000 mL | OROMUCOSAL | Status: DC | PRN

## 2022-10-24 MED ORDER — INSULIN ASPART 100 UNIT/ML IJ SOLN
0.0000 [IU] | INTRAMUSCULAR | Status: DC
  Administered 2022-10-24 – 2022-10-25 (×8): 2 [IU] via SUBCUTANEOUS

## 2022-10-24 MED ORDER — IPRATROPIUM-ALBUTEROL 0.5-2.5 (3) MG/3ML IN SOLN
3.0000 mL | RESPIRATORY_TRACT | Status: DC
  Administered 2022-10-24 – 2022-10-25 (×7): 3 mL via RESPIRATORY_TRACT
  Filled 2022-10-24 (×7): qty 3

## 2022-10-24 MED ORDER — METHYLPREDNISOLONE SODIUM SUCC 125 MG IJ SOLR
125.0000 mg | Freq: Once | INTRAMUSCULAR | Status: AC
  Administered 2022-10-24: 125 mg via INTRAVENOUS
  Filled 2022-10-24: qty 2

## 2022-10-24 MED ORDER — SUCCINYLCHOLINE CHLORIDE 200 MG/10ML IV SOSY
PREFILLED_SYRINGE | INTRAVENOUS | Status: DC | PRN
  Administered 2022-10-24: 80 mg via INTRAVENOUS

## 2022-10-24 MED ORDER — POLYETHYLENE GLYCOL 3350 17 G PO PACK
17.0000 g | PACK | Freq: Every day | ORAL | Status: DC
  Administered 2022-10-24 – 2022-10-25 (×2): 17 g
  Filled 2022-10-24 (×2): qty 1

## 2022-10-24 MED ORDER — DOCUSATE SODIUM 50 MG/5ML PO LIQD
100.0000 mg | Freq: Two times a day (BID) | ORAL | Status: DC
  Administered 2022-10-24 – 2022-10-25 (×3): 100 mg
  Filled 2022-10-24 (×3): qty 10

## 2022-10-24 MED ORDER — NOREPINEPHRINE 4 MG/250ML-% IV SOLN
2.0000 ug/min | INTRAVENOUS | Status: DC
  Administered 2022-10-24: 2 ug/min via INTRAVENOUS
  Filled 2022-10-24: qty 250

## 2022-10-24 MED ORDER — OXYMETAZOLINE HCL 0.05 % NA SOLN
NASAL | Status: DC | PRN
  Administered 2022-10-24: 2 via NASAL

## 2022-10-24 MED ORDER — EPINEPHRINE 0.3 MG/0.3ML IJ SOAJ
0.3000 mg | Freq: Once | INTRAMUSCULAR | Status: AC
  Administered 2022-10-24: 0.3 mg via INTRAMUSCULAR
  Filled 2022-10-24: qty 0.3

## 2022-10-24 MED ORDER — PROPOFOL 1000 MG/100ML IV EMUL
0.0000 ug/kg/min | INTRAVENOUS | Status: DC
  Administered 2022-10-24: 50 ug/kg/min via INTRAVENOUS
  Administered 2022-10-24: 40 ug/kg/min via INTRAVENOUS
  Administered 2022-10-24 (×2): 50 ug/kg/min via INTRAVENOUS
  Administered 2022-10-25: 40 ug/kg/min via INTRAVENOUS
  Filled 2022-10-24 (×5): qty 100

## 2022-10-24 MED ORDER — PROPOFOL 1000 MG/100ML IV EMUL
INTRAVENOUS | Status: AC
  Filled 2022-10-24: qty 100

## 2022-10-24 MED ORDER — SODIUM CHLORIDE 0.9 % IV SOLN: INTRAVENOUS | Status: DC | PRN

## 2022-10-24 MED ORDER — MIDAZOLAM HCL 2 MG/2ML IJ SOLN
INTRAMUSCULAR | Status: AC
  Filled 2022-10-24: qty 2

## 2022-10-24 MED ORDER — SODIUM CHLORIDE 0.9 % IV SOLN
3.0000 g | Freq: Four times a day (QID) | INTRAVENOUS | Status: DC
  Administered 2022-10-24 – 2022-10-27 (×13): 3 g via INTRAVENOUS
  Filled 2022-10-24 (×13): qty 8

## 2022-10-24 MED ORDER — PREDNISONE 20 MG PO TABS
40.0000 mg | ORAL_TABLET | Freq: Every day | ORAL | Status: DC
  Administered 2022-10-25: 40 mg
  Filled 2022-10-24: qty 2

## 2022-10-24 MED ORDER — ORAL CARE MOUTH RINSE
15.0000 mL | OROMUCOSAL | Status: DC
  Administered 2022-10-24 – 2022-10-25 (×14): 15 mL via OROMUCOSAL

## 2022-10-24 MED ORDER — KETAMINE HCL 10 MG/ML IJ SOLN
INTRAMUSCULAR | Status: DC | PRN
  Administered 2022-10-24: 20 mg via INTRAVENOUS
  Administered 2022-10-24: 10 mg via INTRAVENOUS

## 2022-10-24 MED ORDER — LIDOCAINE HCL 2 % IJ SOLN
INTRAMUSCULAR | Status: AC
  Filled 2022-10-24: qty 20

## 2022-10-24 MED ORDER — VITAL HIGH PROTEIN PO LIQD
1000.0000 mL | ORAL | Status: DC
  Administered 2022-10-24 – 2022-10-25 (×2): 1000 mL

## 2022-10-24 MED ORDER — ROCURONIUM BROMIDE 100 MG/10ML IV SOLN
INTRAVENOUS | Status: DC | PRN
  Administered 2022-10-24: 30 mg via INTRAVENOUS

## 2022-10-24 MED ORDER — DIPHENHYDRAMINE HCL 50 MG/ML IJ SOLN
25.0000 mg | Freq: Once | INTRAMUSCULAR | Status: AC
  Administered 2022-10-24: 25 mg via INTRAVENOUS
  Filled 2022-10-24: qty 1

## 2022-10-24 MED ORDER — SUCCINYLCHOLINE CHLORIDE 200 MG/10ML IV SOSY
PREFILLED_SYRINGE | INTRAVENOUS | Status: AC
  Filled 2022-10-24: qty 10

## 2022-10-24 MED ORDER — FENTANYL CITRATE PF 50 MCG/ML IJ SOSY: 50.0000 ug | PREFILLED_SYRINGE | Freq: Once | INTRAMUSCULAR | Status: DC

## 2022-10-24 MED ORDER — ENOXAPARIN SODIUM 40 MG/0.4ML IJ SOSY
40.0000 mg | PREFILLED_SYRINGE | INTRAMUSCULAR | Status: DC
  Administered 2022-10-24 – 2022-10-27 (×4): 40 mg via SUBCUTANEOUS
  Filled 2022-10-24 (×4): qty 0.4

## 2022-10-24 MED ORDER — PANTOPRAZOLE SODIUM 40 MG IV SOLR
40.0000 mg | INTRAVENOUS | Status: DC
  Administered 2022-10-24 – 2022-10-26 (×3): 40 mg via INTRAVENOUS
  Filled 2022-10-24 (×3): qty 10

## 2022-10-24 MED ORDER — CHLORHEXIDINE GLUCONATE CLOTH 2 % EX PADS
6.0000 | MEDICATED_PAD | Freq: Every day | CUTANEOUS | Status: DC
  Administered 2022-10-24 – 2022-10-26 (×3): 6 via TOPICAL

## 2022-10-24 MED ORDER — PROPOFOL 500 MG/50ML IV EMUL
INTRAVENOUS | Status: DC | PRN
  Administered 2022-10-24: 100 ug/kg/min via INTRAVENOUS

## 2022-10-24 MED ORDER — KETAMINE HCL 50 MG/5ML IJ SOSY
PREFILLED_SYRINGE | INTRAMUSCULAR | Status: AC
  Filled 2022-10-24: qty 5

## 2022-10-24 MED ORDER — PROSOURCE TF20 ENFIT COMPATIBL EN LIQD
60.0000 mL | Freq: Every day | ENTERAL | Status: DC
  Administered 2022-10-24 – 2022-10-25 (×2): 60 mL
  Filled 2022-10-24 (×2): qty 60

## 2022-10-24 MED ORDER — LACTATED RINGERS IV BOLUS
1000.0000 mL | Freq: Once | INTRAVENOUS | Status: AC
  Administered 2022-10-24: 1000 mL via INTRAVENOUS

## 2022-10-24 NOTE — Anesthesia Procedure Notes (Signed)
Procedure Name: Intubation Date/Time: 10/24/2022 7:06 AM  Performed by: Leonides Grills, MDPre-anesthesia Checklist: Patient identified, Emergency Drugs available, Suction available and Patient being monitored Patient Re-evaluated:Patient Re-evaluated prior to induction Oxygen Delivery Method: Circle system utilized Preoxygenation: Pre-oxygenation with 100% oxygen Induction Type: IV induction Ventilation: Mask ventilation without difficulty Laryngoscope Size: Glidescope and 4 Grade View: Grade I Tube type: Oral Tube size: 7.5 mm Number of attempts: 1 Airway Equipment and Method: Stylet, Oral airway, Patient positioned with wedge pillow, Video-laryngoscopy and Fiberoptic brochoscope Placement Confirmation: ETT inserted through vocal cords under direct vision, positive ETCO2 and breath sounds checked- equal and bilateral Secured at: 24 cm Tube secured with: Tape Dental Injury: Teeth and Oropharynx as per pre-operative assessment  Difficulty Due To: Difficulty was anticipated, Difficult Airway- due to large tongue and Difficult Airway-  due to edematous airway Comments: Airway topicalized with lidocaine. Sedation given. Glidescope # 4 blade placed with the patient spontaneously ventilating. Epiglottis visualized, but unable to visualize vocal cords due to patient coughing. Ovassapian airway placed and flexible Ambu bronchoscope utilized to visualize the vocal cords. Decision made to induce and place the oral endotracheal tube with the Glidscope.

## 2022-10-24 NOTE — Transfer of Care (Signed)
Immediate Anesthesia Transfer of Care Note  Patient: Shane Valentine  Procedure(s) Performed: INTUBATION-ENDOTRACHEAL  Patient Location: ICU  Anesthesia Type:General  Level of Consciousness: sedated and Patient remains intubated per anesthesia plan  Airway & Oxygen Therapy: Patient remains intubated per anesthesia plan and Patient placed on Ventilator (see vital sign flow sheet for setting)  Post-op Assessment: Report given to RN and Post -op Vital signs reviewed and stable  Post vital signs: Reviewed and stable  Last Vitals: VSS. See ICU vitals Vitals Value Taken Time  BP    Temp    Pulse    Resp    SpO2      Last Pain:  Vitals:   10/24/22 0526  PainSc: 0-No pain         Complications:  Encounter Notable Events  Notable Event Outcome Phase Comment  Difficult to intubate - expected  Intraprocedure Filed from anesthesia note documentation.

## 2022-10-24 NOTE — Progress Notes (Signed)
Moved to the ICU post-op. Intubated sedated on propofol & fentanyl.  BP 116/83 (BP Location: Right Arm)   Pulse 78   Temp 98 F (36.7 C) (Oral)   Resp 18   Ht 5\' 8"  (1.727 m)   Wt 78.5 kg   SpO2 98%   BMI 26.31 kg/m  Wheezing, high peak vent pressures but low Pplat. Prolonged exhalation. RASS -4, frequent coughing. Moderate volume of clear secretions from ETT.   CXR> likely aspiration into RLL- silhouetted hemidiaphgram  Plan: PAD protocol; monitor swelling before attempting vent weaning. Check C4 level to ensure this is likely ACE-related angioedema.  1L LR for increase Cr Start TF today Unasyn for likely aspiration Bronchodilators, start empiric steroids for AE COPD GI prophylaxis: pantoprazole DVT prophylaxis: lovenox  Daughter Lea updated via phone.   This patient is critically ill with multiple organ system failure which requires frequent high complexity decision making, assessment, support, evaluation, and titration of therapies. This was completed through the application of advanced monitoring technologies and extensive interpretation of multiple databases. During this encounter critical care time was devoted to patient care services described in this note for 32 minutes.  Steffanie Dunn, DO 10/24/22 9:46 AM [facility]  For contact information, see Amion. If no response to pager, please call PCCM consult pager. After hours, 7PM- 7AM, please call Elink.

## 2022-10-24 NOTE — Progress Notes (Addendum)
Glasses at bedside. Cell phone, wallet, keys and clothes sent home with daughter Tacey Ruiz.

## 2022-10-24 NOTE — H&P (Signed)
NAME:  Shane Valentine, MRN:  [id number], DOB:  [birth year], LOS: 0 ADMISSION DATE:  10/24/2022, CONSULTATION DATE:  10/24/22 REFERRING MD:  Mesner, CHIEF COMPLAINT:  angioedema   History of Present Illness:  Shane Valentine is a 57 y.o. M with PMH significant for HTN, HL, GERD, CVA and PVD who woke up overnight with swelling of the tongue.  He was been on Lisinopril for some time.  No hives or itching.  He was given epi, steroids, pepcid and benadryl and epi however edema worsened to the point he could not tolerate his own secretions.   PCCM and ENT were called emergently to the ED for assistance with intubation  Pertinent  Medical History   has a past medical history of Arthritis, Genital warts, GERD (gastroesophageal reflux disease), Headache, Hyperlipidemia, Hypertension, Paresthesia of hand, bilateral, Peripheral vascular disease, Smoker, Stroke, and Tuberculosis (1995).   Significant Hospital Events: Including procedures, antibiotic start and stop dates in addition to other pertinent events   4/7 presented to ED with angioedema   Interim History / Subjective:    Objective   Blood pressure (!) 140/98, pulse 68, temperature 97.7 F (36.5 C), resp. rate 19, height 5\' 8"  (1.727 m), weight 83 kg, SpO2 100 %.        Intake/Output Summary (Last 24 hours) at 10/24/2022 0618 Last data filed at 10/24/2022 0865 Gross per 24 hour  Intake 50 ml  Output --  Net 50 ml   Filed Weights   10/24/22 0523  Weight: 83 kg    General:  well nourished M, awake, anxious HEENT: MM pink/massive angioedema, no facial edema Neuro: awake, alert and responsive CV: s1s2 rrr, no m/r/g PULM:  clear bilaterally without rhonchi, mildly increased work of breathing GI: soft, non-tender  Extremities: warm/dry, no edema  Skin: no rashes or lesions   Resolved Hospital Problem list     Assessment & Plan:   # Angioedema # Acute hypoxic respiratory failure Suspected ACEi-related. Trouble managing secretions  prompting awake fiberoptic intubation in OR with ENT. - s/p TXA, consider FFP if orotracheally or nasotracheally intubated, could defer if proceed instead to tracheostomy - full vent support - pad protocol sedation for rass -2 to -3 in setting difficult airway - VAP bundle   # HTN - hold home antihypertensives pending response to sedation  # Peripheral vascular disease - home plavix 75 mg daily - home crestor 20 mg daily  Best Practice (right click and "Reselect all SmartList Selections" daily)   Diet/type: NPO DVT prophylaxis: LMWH GI prophylaxis: PPI Lines: N/A Foley:  N/A Code Status:  full code Last date of multidisciplinary goals of care discussion [pt updated at bedside]  Labs   CBC: Recent Labs  Lab 10/24/22 0526  WBC 10.0  NEUTROABS 6.5  HGB 14.3  HCT 46.2  MCV 100.0  PLT 183    Basic Metabolic Panel: No results for input(s): "NA", "K", "CL", "CO2", "GLUCOSE", "BUN", "CREATININE", "CALCIUM", "MG", "PHOS" in the last 168 hours. GFR: CrCl cannot be calculated (Patient's most recent lab result is older than the maximum 21 days allowed.). Recent Labs  Lab 10/24/22 0526  WBC 10.0    Liver Function Tests: No results for input(s): "AST", "ALT", "ALKPHOS", "BILITOT", "PROT", "ALBUMIN" in the last 168 hours. No results for input(s): "LIPASE", "AMYLASE" in the last 168 hours. No results for input(s): "AMMONIA" in the last 168 hours.  ABG No results found for: "PHART", "PCO2ART", "PO2ART", "HCO3", "TCO2", "ACIDBASEDEF", "O2SAT"   Coagulation Profile: No  results for input(s): "INR", "PROTIME" in the last 168 hours.  Cardiac Enzymes: No results for input(s): "CKTOTAL", "CKMB", "CKMBINDEX", "TROPONINI" in the last 168 hours.  HbA1C: Hgb A1c MFr Bld  Date/Time Value Ref Range Status  08/29/2017 11:50 AM 5.5 4.8 - 5.6 % Final    Comment:             Prediabetes: 5.7 - 6.4          Diabetes: >6.4          Glycemic control for adults with diabetes: <7.0      CBG: No results for input(s): "GLUCAP" in the last 168 hours.  Review of Systems:   Unable to obtain in setting of angioedema   Past Medical History:  He,  has a past medical history of Arthritis, Genital warts, GERD (gastroesophageal reflux disease), Headache, Hyperlipidemia, Hypertension, Paresthesia of hand, bilateral, Peripheral vascular disease, Smoker, Stroke, and Tuberculosis (1995).   Surgical History:   Past Surgical History:  Procedure Laterality Date   COLONOSCOPY     INGUINAL HERNIA REPAIR Left 03/29/2014   Procedure: LAPAROSCOPIC LEFT INGUINAL HERNIA REPAIR;  Surgeon: Axel FillerArmando Ramirez, MD;  Location: [facility] OR;  Service: General;  Laterality: Left;   INSERTION OF MESH Left 03/29/2014   Procedure: INSERTION OF MESH;  Surgeon: Axel FillerArmando Ramirez, MD;  Location: [facility] OR;  Service: General;  Laterality: Left;   LOWER EXTREMITY ANGIOGRAPHY N/A 08/11/2021   Procedure: LOWER EXTREMITY ANGIOGRAPHY;  Surgeon: Yates DecampGanji, Jay, MD;  Location: [facility] INVASIVE CV LAB;  Service: Cardiovascular;  Laterality: N/A;   PERIPHERAL VASCULAR ATHERECTOMY Left 08/11/2021   Procedure: PERIPHERAL VASCULAR ATHERECTOMY;  Surgeon: Yates DecampGanji, Jay, MD;  [facility];  Service: Cardiovascular;  Laterality: Left;   TUMOR EXCISION Bilateral 03/21/2020   Procedure: TUMOR EXCISION RECTAL, anal condyloma;  Surgeon: Campbell Lernerodenberg, Denny, MD;  Location: [facility] ORS;  Service: General;  Laterality: Bilateral;   UPPER GASTROINTESTINAL ENDOSCOPY       Social History:   reports that he has been smoking cigarettes. He has a 11.50 pack-year smoking history. He has never used smokeless tobacco. He reports current drug use. Drug: Marijuana. He reports that he does not drink alcohol.   Family History:  His family history includes COPD in his brother; Cancer in his father; Cerebral aneurysm in his brother; Diabetes in his mother; Hypertension in his mother. There is no history of Heart disease, Stroke, Colon cancer, Colon  polyps, Crohn's disease, Esophageal cancer, Rectal cancer, Stomach cancer, or Ulcerative colitis.   Allergies No Known Allergies   Home Medications  Prior to Admission medications   Medication Sig Start Date End Date Taking? Authorizing Provider  amLODipine (NORVASC) 5 MG tablet Take 1 tablet (5 mg total) by mouth daily. 04/21/22 04/16/23  Tysinger, Kermit Baloavid S, PA-C  aspirin EC 81 MG tablet Take 1 tablet (81 mg total) by mouth daily. Patient not taking: Reported on 06/08/2022 04/21/22 04/21/23  Tysinger, Kermit Baloavid S, PA-C  clopidogrel (PLAVIX) 75 MG tablet Take 75 mg by mouth daily. 04/12/22   [provider]  lisinopril-hydrochlorothiazide (ZESTORETIC) 20-25 MG tablet Take 1 tablet by mouth daily. 04/28/22   Tysinger, Kermit Baloavid S, PA-C  pantoprazole (PROTONIX) 40 MG tablet Take 1 tablet (40 mg total) by mouth daily. 09/06/22   Jenel Lucksunningham, Scott E, MD  rosuvastatin (CRESTOR) 20 MG tablet Take 1 tablet (20 mg total) by mouth daily. 04/21/22   Tysinger, Kermit Baloavid S, PA-C  spironolactone (ALDACTONE) 50 MG tablet Take 50 mg by mouth daily.  [provider]     Critical care time: 37 minutes

## 2022-10-24 NOTE — Progress Notes (Signed)
eLink Physician-Brief Progress Note Patient Name: Shane Valentine DOB: 02-Feb-1966 MRN: [id number]   Date of Service  10/24/2022  HPI/Events of Note  57 year old male with a history of essential hypertension, dyslipidemia, CVA and PAD who woke up with tongue swelling.  Patient has angioedema likely associated with longstanding lisinopril use.  He was intubated in the operating room and transferred to critical care for further management.  Starting to develop mild hypotension, maps currently maintaining above 65.  eICU Interventions  Will plan for peripheral ultrasound-guided IV placement and anticipate peripheral norepinephrine as needed to maintain MAP greater than 65     Intervention Category Major Interventions: Hypotension - evaluation and management  Lillian Tigges 10/24/2022, 7:44 PM

## 2022-10-24 NOTE — Anesthesia Postprocedure Evaluation (Signed)
Anesthesia Post Note  Patient: Shane Valentine  Procedure(s) Performed: INTUBATION-ENDOTRACHEAL     Patient location during evaluation: SICU Anesthesia Type: General Level of consciousness: sedated Pain management: pain level controlled Vital Signs Assessment: post-procedure vital signs reviewed and stable Respiratory status: patient remains intubated per anesthesia plan Cardiovascular status: stable Postop Assessment: no apparent nausea or vomiting Anesthetic complications: yes   Encounter Notable Events  Notable Event Outcome Phase Comment  Difficult to intubate - expected  Intraprocedure Filed from anesthesia note documentation.    Last Vitals:  Vitals:   10/24/22 0525 10/24/22 0745  BP: (!) 140/98 (!) 135/91  Pulse: 68 76  Resp: 19 18  Temp: 36.5 C   SpO2: 100% 97%    Last Pain:  Vitals:   10/24/22 0526  PainSc: 0-No pain                 Eupha Lobb P Bowen Kia

## 2022-10-24 NOTE — Progress Notes (Signed)
Two patient belonging bags brought to [facility] room 24 and placed at patient bedside. Witnessed by patient RN Melynn.

## 2022-10-24 NOTE — ED Notes (Signed)
MD Mesner explaining plan of care for nasal intubation, patient agreed with plan of care.

## 2022-10-24 NOTE — ED Provider Notes (Signed)
[place] EMERGENCY DEPARTMENT AT [facility] Provider Note   CSN: [id number] Arrival date & time: 10/24/22  4944     History  Chief Complaint  Patient presents with   Allergic Reaction    Shane Valentine is a 58 y.o. male.  57 yo M here with tongue swelling that started spontaneously tonight. On Lisinopril for years. No rash. No lightheadedness, progressively worsening. Not handling secretions well. No known allergies.    Allergic Reaction      Home Medications Prior to Admission medications   Medication Sig Start Date End Date Taking? Authorizing Provider  amLODipine (NORVASC) 5 MG tablet Take 1 tablet (5 mg total) by mouth daily. 04/21/22 04/16/23  Tysinger, Kermit Balo, PA-C  aspirin EC 81 MG tablet Take 1 tablet (81 mg total) by mouth daily. Patient not taking: Reported on 06/08/2022 04/21/22 04/21/23  Tysinger, Kermit Balo, PA-C  clopidogrel (PLAVIX) 75 MG tablet Take 75 mg by mouth daily. 04/12/22   [provider]  lisinopril-hydrochlorothiazide (ZESTORETIC) 20-25 MG tablet Take 1 tablet by mouth daily. 04/28/22   Tysinger, Kermit Balo, PA-C  pantoprazole (PROTONIX) 40 MG tablet Take 1 tablet (40 mg total) by mouth daily. 09/06/22   Jenel Lucks, MD  rosuvastatin (CRESTOR) 20 MG tablet Take 1 tablet (20 mg total) by mouth daily. 04/21/22   Tysinger, Kermit Balo, PA-C  spironolactone (ALDACTONE) 50 MG tablet Take 50 mg by mouth daily.    [provider]      Allergies    Patient has no known allergies.    Review of Systems   Review of Systems  Physical Exam Updated Vital Signs BP (!) 140/98 (BP Location: Right Arm)   Pulse 68   Temp 97.7 F (36.5 C)   Resp 19   Ht 5\' 8"  (1.727 m)   Wt 83 kg   SpO2 100%   BMI 27.82 kg/m  Physical Exam Vitals and nursing note reviewed.  Constitutional:      Appearance: He is well-developed.  HENT:     Head: Normocephalic and atraumatic.     Comments: Significant lingual edema and sublingual woodiness c/w  angioedema Cardiovascular:     Rate and Rhythm: Normal rate.  Pulmonary:     Effort: Pulmonary effort is normal. No respiratory distress.  Abdominal:     General: There is no distension.  Musculoskeletal:        General: Normal range of motion.     Cervical back: Normal range of motion.  Neurological:     Mental Status: He is alert.     ED Results / Procedures / Treatments   Labs (all labs ordered are listed, but only abnormal results are displayed) Labs Reviewed  COMPREHENSIVE METABOLIC PANEL - Abnormal; Notable for the following components:      Result Value   BUN 21 (*)    Creatinine, Ser 1.28 (*)    Total Bilirubin 1.3 (*)    All other components within normal limits  CBC WITH DIFFERENTIAL/PLATELET  HIV ANTIBODY (ROUTINE TESTING W REFLEX)  HEMOGLOBIN A1C    EKG None  Radiology DG Chest Portable 1 View  Result Date: 10/24/2022 CLINICAL DATA:  57 year old male history of tongue swelling. EXAM: PORTABLE CHEST 1 VIEW COMPARISON:  Chest x-ray 02/28/2014. FINDINGS: Lung volumes are low. No consolidative airspace disease. Mild interstitial prominence and peribronchial cuffing noted throughout the mid to lower lungs bilaterally, which may suggest a mild acute bronchitis. No pleural effusions. No pneumothorax. No pulmonary nodule or  mass noted. Pulmonary vasculature and the cardiomediastinal silhouette are within normal limits. IMPRESSION: 1. Findings are concerning for mild acute bronchitis, as above. Electronically Signed   By: Trudie Reed M.D.   On: 10/24/2022 06:38    Procedures .Critical Care  Performed by: Marily Memos, MD Authorized by: Marily Memos, MD   Critical care provider statement:    Critical care time (minutes):  30   Critical care was necessary to treat or prevent imminent or life-threatening deterioration of the following conditions:  Respiratory failure   Critical care was time spent personally by me on the following activities:  Development of  treatment plan with patient or surrogate, discussions with consultants, evaluation of patient's response to treatment, examination of patient, ordering and review of laboratory studies, ordering and review of radiographic studies, ordering and performing treatments and interventions, pulse oximetry, re-evaluation of patient's condition and review of old charts     Medications Ordered in ED Medications  lidocaine (XYLOCAINE) 4 % external solution ( Topical MAR Hold 10/24/22 0701)  lidocaine (XYLOCAINE) 2 % (with pres) injection (has no administration in time range)  docusate sodium (COLACE) capsule 100 mg ( Oral MAR Hold 10/24/22 0701)  polyethylene glycol (MIRALAX / GLYCOLAX) packet 17 g ( Oral MAR Hold 10/24/22 0701)  insulin aspart (novoLOG) injection 0-15 Units ( Subcutaneous Automatically Held 11/01/22 2000)  methylPREDNISolone sodium succinate (SOLU-MEDROL) 125 mg/2 mL injection 125 mg (125 mg Intravenous Given 10/24/22 0536)  famotidine (PEPCID) IVPB 20 mg premix (0 mg Intravenous Stopped 10/24/22 0618)  diphenhydrAMINE (BENADRYL) injection 25 mg (25 mg Intravenous Given 10/24/22 0536)  EPINEPHrine (EPI-PEN) injection 0.3 mg (0.3 mg Intramuscular Given 10/24/22 0539)  tranexamic acid (CYKLOKAPRON) IVPB 1,000 mg (0 mg Intravenous Stopped 10/24/22 0555)    ED Course/ Medical Decision Making/ A&P                             Medical Decision Making Amount and/or Complexity of Data Reviewed Labs: ordered. Radiology: ordered.  Risk Prescription drug management.   Significant angioedema. Initial medications did not seem to help much and his symptoms worsened with gagging and dry heaving. Drooling significantly. Called Pulm (Dr. Thora Lance) who requested ENT consult.  D/w Dr. Elijah Birk who came to see patient. Tried to intubate nasally and orally and patient didn't tolerate. Ultimately went to OR for intubation and possible trach if needed.   Final Clinical Impression(s) / ED Diagnoses Final diagnoses:   Angioedema, initial encounter    Rx / DC Orders ED Discharge Orders     None         Malikiah Debarr, Barbara Cower, MD 10/24/22 [phone]

## 2022-10-24 NOTE — ED Notes (Signed)
Patient moved to Trauma A at this time, RT at bedside, MD Mesner at bedside.

## 2022-10-24 NOTE — Op Note (Addendum)
Total of 60 minutes of critical care services were provided by me in managing this difficult airway.  My care began in the emergency room in the trauma bay, continued on transport, and was finalized in the operating room.  Procedure(s): INTUBATION-ENDOTRACHEAL WITH TRACHEOSTOMY STANDBY Procedure Note  Shane Valentine male 57 y.o. 10/24/2022  Procedure(s) and Anesthesia Type:    * INTUBATION-ENDOTRACHEAL WITH TRACHEOSTOMY STANDBY - Choice  Surgeon(s) and Role:    * Jessilyn Catino, Alycia Rossetti., MD - Primary   Indications: I was called about an hour ago to the emergency room trauma bay for an emergency airway secondary to angioedema.  I was able to pass the scope transorally and can see the epiglottis.  The patient was stable so I elected to take him to the operating room for intubation with possible tracheostomy if needed.  Surgeon: Trixie Dredge.   Assistants: None  Anesthesia: Topically anesthetized the upper airway using lidocaine nebs.  When it was determined the patient was able to be intubated transorally, IV medications were given.   Procedure Detail  INTUBATION-ENDOTRACHEAL WITH TRACHEOSTOMY STANDBY  Findings:  I rolled the patient's bed up to the operating room.  Anesthesia was preparing for emergency airway.  After brief discussion it was elected that we transorally intubate if possible given her ability to see the epiglottis.  The patient's nasal cavity was markedly obstructed bilaterally presumably from a posterior septal deviation which appeared S-shaped.  I had concerns about her ability to easily pass a scope and tube through the nose.  Meds were given and the glide scope was used to visualize the larynx.  Anesthesia was able to pass an endotracheal tube through the cords and into the trachea without difficulty.  The tube was taped in place.  I was immediately at the bedside to help manage the airway and was prepared to perform a tracheostomy if needed.  Estimated Blood  Loss:  Minimal              Complications:  * No complications entered in OR log *         Disposition: ICU - intubated and hemodynamically stable.         Condition: stable

## 2022-10-24 NOTE — Anesthesia Preprocedure Evaluation (Addendum)
Anesthesia Evaluation  Patient identified by MRN, date of birth, ID band Patient awake  Preop documentation limited or incomplete due to emergent nature of procedure.  Airway Mallampati: IV       Dental  (+) Edentulous Upper, Missing   Pulmonary Current Smoker and Patient abstained from smoking.   Pulmonary exam normal        Cardiovascular hypertension, Normal cardiovascular exam     Neuro/Psych    GI/Hepatic   Endo/Other    Renal/GU      Musculoskeletal   Abdominal   Peds  Hematology   Anesthesia Other Findings Angioedema  Reproductive/Obstetrics                             Anesthesia Physical Anesthesia Plan  ASA: 4 and emergent  Anesthesia Plan: General   Post-op Pain Management:    Induction:   PONV Risk Score and Plan: 0 and Treatment may vary due to age or medical condition  Airway Management Planned: Video Laryngoscope Planned, Fiberoptic Intubation Planned and Awake Intubation Planned  Additional Equipment:   Intra-op Plan:   Post-operative Plan: Post-operative intubation/ventilation  Informed Consent:      Only emergency history available  Plan Discussed with: CRNA and Surgeon  Anesthesia Plan Comments:        Anesthesia Quick Evaluation

## 2022-10-24 NOTE — ED Triage Notes (Signed)
Pt to ED by POV from home c/o angioedema. Pt has considerable tongue swelling. Pt states he takes lisinopril on a regular basis. Arrives A+O, VSS, MD at bedside.

## 2022-10-25 ENCOUNTER — Encounter (HOSPITAL_COMMUNITY): Payer: Self-pay | Admitting: *Deleted

## 2022-10-25 DIAGNOSIS — J9601 Acute respiratory failure with hypoxia: Secondary | ICD-10-CM

## 2022-10-25 DIAGNOSIS — T783XXA Angioneurotic edema, initial encounter: Secondary | ICD-10-CM | POA: Diagnosis not present

## 2022-10-25 LAB — CBC
HCT: 35.4 % — ABNORMAL LOW (ref 39.0–52.0)
Hemoglobin: 12 g/dL — ABNORMAL LOW (ref 13.0–17.0)
MCH: 32.1 pg (ref 26.0–34.0)
MCHC: 33.9 g/dL (ref 30.0–36.0)
MCV: 94.7 fL (ref 80.0–100.0)
Platelets: 164 10*3/uL (ref 150–400)
RBC: 3.74 MIL/uL — ABNORMAL LOW (ref 4.22–5.81)
RDW: 13.4 % (ref 11.5–15.5)
WBC: 11.5 10*3/uL — ABNORMAL HIGH (ref 4.0–10.5)
nRBC: 0 % (ref 0.0–0.2)

## 2022-10-25 LAB — BASIC METABOLIC PANEL
Anion gap: 12 (ref 5–15)
BUN: 26 mg/dL — ABNORMAL HIGH (ref 6–20)
CO2: 23 mmol/L (ref 22–32)
Calcium: 8.8 mg/dL — ABNORMAL LOW (ref 8.9–10.3)
Chloride: 105 mmol/L (ref 98–111)
Creatinine, Ser: 1.09 mg/dL (ref 0.61–1.24)
GFR, Estimated: >60 mL/min (ref >60)
Glucose, Bld: 120 mg/dL — ABNORMAL HIGH (ref 70–99)
Potassium: 3.9 mmol/L (ref 3.5–5.1)
Sodium: 140 mmol/L (ref 135–145)

## 2022-10-25 LAB — MAGNESIUM: Magnesium: 1.9 mg/dL (ref 1.7–2.4)

## 2022-10-25 LAB — GLUCOSE, CAPILLARY
Glucose-Capillary: 135 mg/dL — ABNORMAL HIGH (ref 70–99)
Glucose-Capillary: 130 mg/dL — ABNORMAL HIGH (ref 70–99)
Glucose-Capillary: 121 mg/dL — ABNORMAL HIGH (ref 70–99)
Glucose-Capillary: 124 mg/dL — ABNORMAL HIGH (ref 70–99)
Glucose-Capillary: 117 mg/dL — ABNORMAL HIGH (ref 70–99)
Glucose-Capillary: 128 mg/dL — ABNORMAL HIGH (ref 70–99)

## 2022-10-25 LAB — PHOSPHORUS: Phosphorus: 4.6 mg/dL (ref 2.5–4.6)

## 2022-10-25 LAB — C4 COMPLEMENT: Complement C4, Body Fluid: 22 mg/dL (ref 12–38)

## 2022-10-25 LAB — TRIGLYCERIDES: Triglycerides: 70 mg/dL (ref <150)

## 2022-10-25 MED ORDER — DOCUSATE SODIUM 50 MG/5ML PO LIQD
100.0000 mg | Freq: Two times a day (BID) | ORAL | Status: DC
  Administered 2022-10-25: 100 mg via ORAL
  Filled 2022-10-25: qty 10

## 2022-10-25 MED ORDER — POLYETHYLENE GLYCOL 3350 17 G PO PACK: 17.0000 g | PACK | Freq: Every day | ORAL | Status: DC

## 2022-10-25 MED ORDER — ONDANSETRON HCL 4 MG/2ML IJ SOLN
4.0000 mg | Freq: Three times a day (TID) | INTRAMUSCULAR | Status: DC | PRN
  Administered 2022-10-25: 4 mg via INTRAVENOUS
  Filled 2022-10-25: qty 2

## 2022-10-25 MED ORDER — ORAL CARE MOUTH RINSE
15.0000 mL | OROMUCOSAL | Status: DC
  Administered 2022-10-25 – 2022-10-27 (×6): 15 mL via OROMUCOSAL

## 2022-10-25 MED ORDER — SODIUM CHLORIDE 0.9 % IV SOLN
12.5000 mg | Freq: Four times a day (QID) | INTRAVENOUS | Status: DC | PRN
  Administered 2022-10-25: 12.5 mg via INTRAVENOUS
  Filled 2022-10-25: qty 0.5

## 2022-10-25 MED ORDER — PREDNISONE 20 MG PO TABS
40.0000 mg | ORAL_TABLET | Freq: Every day | ORAL | Status: DC
  Administered 2022-10-26 – 2022-10-27 (×2): 40 mg via ORAL
  Filled 2022-10-25 (×2): qty 2

## 2022-10-25 MED ORDER — ORAL CARE MOUTH RINSE: 15.0000 mL | OROMUCOSAL | Status: DC | PRN

## 2022-10-25 MED ORDER — GLUCAGON HCL RDNA (DIAGNOSTIC) 1 MG IJ SOLR
1.0000 mg | Freq: Once | INTRAMUSCULAR | Status: AC | PRN
  Administered 2022-10-25: 1 mg via INTRAVENOUS
  Filled 2022-10-25: qty 1

## 2022-10-25 NOTE — Progress Notes (Signed)
NAME:  Shane Valentine, MRN:  [id number], DOB:  [birth year], LOS: 1 ADMISSION DATE:  10/24/2022, CONSULTATION DATE: 10/24/2022 REFERRING MD:  Mesner - EDP CHIEF COMPLAINT: Angioedema   History of Present Illness:  57 year old man who presented to [facility] ED 4/7 with significant swelling of his tongue that occurred overnight. PMHx significant for HTN, HLD, CVA, PVD and GERD. Of note, patient been on Lisinopril for some time; no hives or itching present.    On presentation to ED, patient was given epi, steroids, Pepcid and Benadryl; however, edema worsened to the point he could not tolerate his own secretions.  PCCM and ENT were called emergently to the ED for assistance with intubation. Patient ultimately was taken to the OR for awake fiberoptic transoral intubation with plan for tracheostomy if transoral airway unable to be obtained. Patient was successfully orally intubated by anesthesia.  Transferred to CVICU post-procedure.  Pertinent Medical History:   Past Medical History:  Diagnosis Date   Arthritis    Genital warts    GERD (gastroesophageal reflux disease)    Headache    Hyperlipidemia    Hypertension    Paresthesia of hand, bilateral    Peripheral vascular disease    Smoker    Stroke    Tuberculosis 1995   43mo therapy, [place]   Significant Hospital Events: Including procedures, antibiotic start and stop dates in addition to other pertinent events   4/7 Presented to ED with angioedema. Intubated (transorally) in OR with anesthesia/ENT. 4/8 Weaning on vent (PSV 8/5), copious oral secretions, gagging/coughing requiring increased sedation but mental status intact on WUA.  Interim History / Subjective:  No significant events overnight Awake and alert on vent, nodding appropriately to questions Writing on whiteboard Uncomfortable from ETT, gagging/coughing Oral secretions pooling in throat, clear Tolerating PSV 8/5 until sedation increased for gagging  Objective:  Blood  pressure 122/80, pulse 63, temperature 98.8 F (37.1 C), resp. rate 16, height 5\' 8"  (1.727 m), weight 79.2 kg, SpO2 97 %.    Vent Mode: PRVC FiO2 (%):  [40 %-100 %] 40 % Set Rate:  [16 bmp-18 bmp] 16 bmp Vt Set:  [540 mL] 540 mL PEEP:  [5 cmH20] 5 cmH20 Plateau Pressure:  [15 cmH20-18 cmH20] 16 cmH20   Intake/Output Summary (Last 24 hours) at 10/25/2022 0732 Last data filed at 10/25/2022 0600 Gross per 24 hour  Intake 3856.71 ml  Output 1385 ml  Net 2471.71 ml    Filed Weights   10/24/22 0523 10/24/22 0745 10/25/22 0500  Weight: 83 kg 78.5 kg 79.2 kg   Physical Examination: General: Acutely ill-appearing middle-aged man in NAD. HEENT: [place]/AT, anicteric sclera, PERRL, moist mucous membranes. Improved tongue/lip swelling, able to close mouth around ETT. Neuro: Awake, oriented x 4. Nodding appropriately to questions/writing on board. Responds to verbal stimuli. Following commands consistently. Moves all 4 extremities spontaneously. Strength 5/5 in all 4 extremities. +Cough and +Gag  CV: Mildly tachycardic to 110s with coughing, no m/g/r. PULM: Breathing even and unlabored on vent (PSV 8/5). Lung fields CTAB. GI: Soft, nontender, nondistended. Normoactive bowel sounds. Extremities: No LE edema noted. Skin: Warm/dry, no rashes.  Resolved Hospital Problem List:    Assessment & Plan:   Angioedema, suspect ACEi-related Acute hypoxic respiratory failure Trouble managing secretions prompting awake fiberoptic intubation in OR with ENT/anesthesia. Fortunately, patient was able to be transorally intubated by anesthesia and emergent tracheostomy was not required. S/p TXA administration. - Continue full vent support (4-8cc/kg IBW), weaning on PSV 8/5 this AM  tolerating well - Wean FiO2 for O2 sat > 90% - Daily WUA/SBT; will need to confirm airway edema resolved prior to any extubation attempt - Steroids as ordered - Bronchodilators (DuoNeb Q4H) - VAP bundle - Pulmonary hygiene - PAD  protocol for sedation: Propofol and Fentanyl for goal RASS -1 to -2  Undifferentiated shock, septic versus component of anaphylaxis?, improving Possible aspiration PNA - Goal MAP > 65 - Fluid resuscitation as tolerated - Levophed titrated to goal MAP, consider transition to epi gtt if more concern for anaphylaxis - Trend WBC, fever curve - F/u Cx data - Continue Unasyn for aspiration coverage  HTN - Hold home antihypertensives at present - Avoid all ACEi moving forward  HLD Peripheral vascular disease - Resume ASA/Plavix, statin as clinically appropriate  GERD - PPI  At risk for steroid-induced hyperglycemia - SSI - CBGs Q4H - Goal CBG 140-180  Best Practice: (right click and "Reselect all SmartList Selections" daily)   Diet/type: NPO DVT prophylaxis: LMWH GI prophylaxis: PPI Lines: N/A Foley:  N/A Code Status:  full code Last date of multidisciplinary goals of care discussion [pt updated at bedside]  Critical care time:    The patient is critically ill with multiple organ system failure and requires high complexity decision making for assessment and support, frequent evaluation and titration of therapies, advanced monitoring, review of radiographic studies and interpretation of complex data.   Critical Care Time devoted to patient care services, exclusive of separately billable procedures, described in this note is 37 minutes.  Tim Lair, PA-C [facility] 10/25/22 7:33 AM  Please see [email or web address] for pager details.  From 7A-7P if no response, please call [phone] After hours, please call ELink [phone]

## 2022-10-25 NOTE — Procedures (Signed)
Extubation Procedure Note  Patient Details:   Name: Shane Valentine DOB: 1966/05/28 MRN: [id number]   Airway Documentation:    Vent end date: 10/25/22 Vent end time: 1213   Evaluation  O2 sats: stable throughout Complications: No apparent complications Patient did tolerate procedure well. Bilateral Breath Sounds: Coarse crackles, Diminished   Yes  Per ccm order RT extubated pt to [place]. Prior to extubation, pt did have a positive cuff leak. Pt was able to state his name. No stridor noted and SVS  Maryla Morrow 10/25/2022, 12:14 PM

## 2022-10-25 NOTE — Evaluation (Signed)
Physical Therapy Evaluation Patient Details Name: Shane Valentine MRN: [id number] DOB: June 06, 1966 Today's Date: 10/25/2022  History of Present Illness  Pt is a 57 y.o. M who presents 10/24/2022 with lip and tongue swelling, admitted with angioedema likely in the setting of ACE inhibitor requiring endotracheal intubation. Extubated 4/8. Significant PMH: HTN, HLD, tobacco dependence.  Clinical Impression  Pt evaluated post extubation. 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 Pt reports fatigue and nausea. Pt ambulating 50 ft with a walker at a min guard assist level. SpO2 > 91% on 2-3L O2, HR stable. Suspect steady progress. Don't anticipate need for follow up PT or DME, but will continue to assess.     Recommendations for follow up therapy are one component of a multi-disciplinary discharge planning process, led by the attending physician.  Recommendations may be updated based on patient status, additional functional criteria and insurance authorization.  Follow Up Recommendations       Assistance Recommended at Discharge PRN  Patient can return home with the following  A little help with walking and/or transfers;Assistance with cooking/housework;Help with stairs or ramp for entrance;Assist for transportation    Equipment Recommendations None recommended by PT  Recommendations for Other Services       Functional Status Assessment Patient has had a recent decline in their functional status and demonstrates the ability to make significant improvements in function in a reasonable and predictable amount of time.     Precautions / Restrictions Precautions Precautions: Fall Restrictions Weight Bearing Restrictions: No      Mobility  Bed Mobility Overal bed mobility: Modified Independent                  Transfers Overall transfer level: Needs assistance Equipment used: Rolling walker (2 wheels) Transfers: Sit to/from Stand Sit to Stand:  Supervision                Ambulation/Gait Ambulation/Gait assistance: Min guard Gait Distance (Feet): 50 Feet Assistive device: Rolling walker (2 wheels) Gait Pattern/deviations: Step-through pattern, Decreased stride length Gait velocity: decreased     General Gait Details: Crouched posture, slow pace, min guard for safety  Stairs            Wheelchair Mobility    Modified Rankin (Stroke Patients Only)       Balance Overall balance assessment: Mild deficits observed, not formally tested                                           Pertinent Vitals/Pain Pain Assessment Pain Assessment: No/denies pain    Home Living Family/patient expects to be discharged to:: Private residence Living Arrangements: Alone Available Help at Discharge: Family Type of Home: Apartment Home Access: Stairs to enter Entrance Stairs-Rails: Doctor, general practice of Steps: 14   Home Layout: One level        Prior Function Prior Level of Function : Independent/Modified Independent             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        Extremity/Trunk Assessment   Upper Extremity Assessment Upper Extremity Assessment: Overall WFL for tasks assessed    Lower Extremity Assessment Lower Extremity Assessment: Overall WFL for tasks assessed       Communication   Communication: No difficulties  Cognition Arousal/Alertness: Awake/alert Behavior During Therapy: WFL for tasks assessed/performed Overall Cognitive Status: Within Functional Limits for tasks assessed                                          General Comments      Exercises     Assessment/Plan    PT Assessment Patient needs continued PT services  PT Problem List Decreased strength;Decreased activity tolerance;Decreased balance;Decreased mobility;Cardiopulmonary status limiting activity       PT Treatment  Interventions Gait training;DME instruction;Functional mobility training;Therapeutic activities;Therapeutic exercise;Balance training;Patient/family education    PT Goals (Current goals can be found in the Care Plan section)  Acute Rehab PT Goals Patient Stated Goal: get better PT Goal Formulation: With patient Time For Goal Achievement: 11/08/22 Potential to Achieve Goals: Good    Frequency Min 3X/week     Co-evaluation               AM-PAC PT "6 Clicks" Mobility  Outcome Measure Help needed turning from your back to your side while in a flat bed without using bedrails?: None Help needed moving from lying on your back to sitting on the side of a flat bed without using bedrails?: None Help needed moving to and from a bed to a chair (including a wheelchair)?: A Little Help needed standing up from a chair using your arms (e.g., wheelchair or bedside chair)?: A Little Help needed to walk in hospital room?: A Little Help needed climbing 3-5 steps with a railing? : A Little 6 Click Score: 20    End of Session   Activity Tolerance: Patient tolerated treatment well Patient left: in chair;with call bell/phone within reach;with chair alarm set Nurse Communication: Mobility status PT Visit Diagnosis: Unsteadiness on feet (R26.81);Difficulty in walking, not elsewhere classified (R26.2)    Time: 6606-0045 PT Time Calculation (min) (ACUTE ONLY): 22 min   Charges:   PT Evaluation $PT Eval Low Complexity: 1 Low          Shane Valentine, PT, DPT Acute Rehabilitation Services Office [phone]   Shane Valentine 10/25/2022, 4:40 PM

## 2022-10-26 ENCOUNTER — Encounter (HOSPITAL_COMMUNITY): Payer: Self-pay | Admitting: Otolaryngology

## 2022-10-26 DIAGNOSIS — T783XXA Angioneurotic edema, initial encounter: Secondary | ICD-10-CM | POA: Diagnosis not present

## 2022-10-26 DIAGNOSIS — N179 Acute kidney failure, unspecified: Secondary | ICD-10-CM | POA: Diagnosis not present

## 2022-10-26 DIAGNOSIS — I10 Essential (primary) hypertension: Secondary | ICD-10-CM

## 2022-10-26 LAB — BASIC METABOLIC PANEL
Anion gap: 6 (ref 5–15)
BUN: 20 mg/dL (ref 6–20)
CO2: 27 mmol/L (ref 22–32)
Calcium: 8.8 mg/dL — ABNORMAL LOW (ref 8.9–10.3)
Chloride: 105 mmol/L (ref 98–111)
Creatinine, Ser: 0.84 mg/dL (ref 0.61–1.24)
GFR, Estimated: >60 mL/min (ref >60)
Glucose, Bld: 105 mg/dL — ABNORMAL HIGH (ref 70–99)
Potassium: 3.7 mmol/L (ref 3.5–5.1)
Sodium: 138 mmol/L (ref 135–145)

## 2022-10-26 LAB — CBC
HCT: 36.7 % — ABNORMAL LOW (ref 39.0–52.0)
Hemoglobin: 12.1 g/dL — ABNORMAL LOW (ref 13.0–17.0)
MCH: 31.7 pg (ref 26.0–34.0)
MCHC: 33 g/dL (ref 30.0–36.0)
MCV: 96.1 fL (ref 80.0–100.0)
Platelets: 170 10*3/uL (ref 150–400)
RBC: 3.82 MIL/uL — ABNORMAL LOW (ref 4.22–5.81)
RDW: 13.2 % (ref 11.5–15.5)
WBC: 11.9 10*3/uL — ABNORMAL HIGH (ref 4.0–10.5)
nRBC: 0 % (ref 0.0–0.2)

## 2022-10-26 LAB — CULTURE, RESPIRATORY W GRAM STAIN: Culture: NORMAL

## 2022-10-26 LAB — GLUCOSE, CAPILLARY
Glucose-Capillary: 106 mg/dL — ABNORMAL HIGH (ref 70–99)
Glucose-Capillary: 115 mg/dL — ABNORMAL HIGH (ref 70–99)
Glucose-Capillary: 116 mg/dL — ABNORMAL HIGH (ref 70–99)

## 2022-10-26 LAB — PHOSPHORUS: Phosphorus: 2.1 mg/dL — ABNORMAL LOW (ref 2.5–4.6)

## 2022-10-26 LAB — MAGNESIUM: Magnesium: 1.9 mg/dL (ref 1.7–2.4)

## 2022-10-26 MED ORDER — PANTOPRAZOLE SODIUM 40 MG PO TBEC
40.0000 mg | DELAYED_RELEASE_TABLET | Freq: Every day | ORAL | Status: DC
  Administered 2022-10-27: 40 mg via ORAL
  Filled 2022-10-26 (×2): qty 1

## 2022-10-26 MED ORDER — CLOPIDOGREL BISULFATE 75 MG PO TABS
75.0000 mg | ORAL_TABLET | Freq: Every day | ORAL | Status: DC
  Administered 2022-10-26 – 2022-10-27 (×2): 75 mg via ORAL
  Filled 2022-10-26 (×2): qty 1

## 2022-10-26 MED ORDER — ROSUVASTATIN CALCIUM 20 MG PO TABS
20.0000 mg | ORAL_TABLET | Freq: Every day | ORAL | Status: DC
  Administered 2022-10-26 – 2022-10-27 (×2): 20 mg via ORAL
  Filled 2022-10-26 (×2): qty 1

## 2022-10-26 MED ORDER — ACETYLCYSTEINE 20 % IN SOLN
4.0000 mL | Freq: Four times a day (QID) | RESPIRATORY_TRACT | Status: DC
  Filled 2022-10-26 (×2): qty 4

## 2022-10-26 MED ORDER — AMLODIPINE BESYLATE 5 MG PO TABS
5.0000 mg | ORAL_TABLET | Freq: Every day | ORAL | Status: DC
  Administered 2022-10-26 – 2022-10-27 (×2): 5 mg via ORAL
  Filled 2022-10-26 (×2): qty 1

## 2022-10-26 MED ORDER — ALBUTEROL SULFATE (2.5 MG/3ML) 0.083% IN NEBU
2.5000 mg | INHALATION_SOLUTION | Freq: Four times a day (QID) | RESPIRATORY_TRACT | Status: DC
  Filled 2022-10-26: qty 3

## 2022-10-26 NOTE — Progress Notes (Signed)
NAME:  Shane Valentine, MRN:  [id number], DOB:  10-Nov-1965, LOS: 2 ADMISSION DATE:  10/24/2022, CONSULTATION DATE: 10/24/2022 REFERRING MD:  Mesner - EDP CHIEF COMPLAINT: Angioedema   History of Present Illness:  57 year old man who presented to [facility] ED 4/7 with significant swelling of his tongue that occurred overnight. PMHx significant for HTN, HLD, CVA, PVD and GERD. Of note, patient been on Lisinopril for some time; no hives or itching present.    On presentation to ED, patient was given epi, steroids, Pepcid and Benadryl; however, edema worsened to the point he could not tolerate his own secretions.  PCCM and ENT were called emergently to the ED for assistance with intubation. Patient ultimately was taken to the OR for awake fiberoptic transoral intubation with plan for tracheostomy if transoral airway unable to be obtained. Patient was successfully orally intubated by anesthesia.  Transferred to CVICU post-procedure.  Pertinent Medical History:   Past Medical History:  Diagnosis Date   Arthritis    Genital warts    GERD (gastroesophageal reflux disease)    Headache    Hyperlipidemia    Hypertension    Paresthesia of hand, bilateral    Peripheral vascular disease    Smoker    Stroke    Tuberculosis 1995   39mo therapy, [place]   Significant Hospital Events: Including procedures, antibiotic start and stop dates in addition to other pertinent events   4/7 Presented to ED with angioedema. Intubated (transorally) in OR with anesthesia/ENT. 4/8 Weaning on vent (PSV 8/5), copious oral secretions, gagging/coughing requiring increased sedation but mental status intact on WUA. Successfully extubated in early afternoon. 4/9 Minimal O2 requirements overnight, sensation of food bolus, hypertensive to SBP 160s/SBP 110s.  Interim History / Subjective:  Extubated 4/8PM, tolerated well O2 requirements weaned down overnight Patient has ongoing sensation of food bolus, feels like "piece of  chicken was stuck" in his throat Sensation has improved but still feels tongue/throat feel a bit swollen Hypertensive with SBP 150s-160s, DBP 100s-110s Home amlodipine resumed, may need uptitration Stable for transfer out of ICU=  Objective:  Blood pressure (!) 147/94, pulse 62, temperature 99 F (37.2 C), temperature source Oral, resp. rate (!) 9, height 5\' 8"  (1.727 m), weight 78.2 kg, SpO2 92 %.    Vent Mode: PRVC FiO2 (%):  [40 %] 40 % Set Rate:  [16 bmp] 16 bmp Vt Set:  [540 mL] 540 mL PEEP:  [5 cmH20] 5 cmH20 Plateau Pressure:  [17 cmH20] 17 cmH20   Intake/Output Summary (Last 24 hours) at 10/26/2022 0800 Last data filed at 10/26/2022 0740 Gross per 24 hour  Intake 882.91 ml  Output 2288 ml  Net -1405.09 ml    Filed Weights   10/24/22 0745 10/25/22 0500 10/26/22 0500  Weight: 78.5 kg 79.2 kg 78.2 kg   Physical Examination: General: Overall well-appearing middle-aged man in NAD. Pleasant and conversant. HEENT: [place]/AT, anicteric sclera, PERRL, moist mucous membranes. Mild palatal/tongue swelling noted, appears boggy/slightly pale. Neuro: Awake, oriented x 4. Responds to verbal stimuli. Following commands consistently. Moves all 4 extremities spontaneously. Strength 5/5 in all 4 extremities.  CV: RRR, no m/g/r. PULM: Breathing even and unlabored on 1LNC. Lung fields CTAB. GI: Soft, nontender, nondistended. Normoactive bowel sounds. Extremities: No LE edema noted. Skin: Warm/dry, no rashes.  Resolved Hospital Problem List:    Assessment & Plan:   Angioedema, suspect ACEi-related, resolving Acute hypoxic respiratory failure, resolving Trouble managing secretions prompting awake fiberoptic intubation in OR with ENT/anesthesia. Fortunately, patient was  able to be transorally intubated by anesthesia and emergent tracheostomy was not required. S/p TXA administration. - Continue supplemental O2 support as needed to maintain SpO2 > 90% - Steroids as ordered, will require taper  at d/c - Bronchodilators discontinued - Pulmonary hygiene  Undifferentiated shock, septic versus component of anaphylaxis?, improving Possible aspiration PNA - Goal MAP > 65 - Fluid resuscitation as needed - Off of pressors today, 4/9 - Trend WBC, fever curve - Continue Unasyn for ?aspiration coverage, 5-day total course  HTN - Amlodipine 5mg  resumed today, may need uptitration in the setting of lisinopril discontinuation - Resume diuretics - Avoid all ACEi moving forward  HLD Peripheral vascular disease - Resume ASA/Plavix, statin  GERD - PPI  At risk for steroid-induced hyperglycemia - Glucoses not requiring coverage, ok to discontinue CBGs/SSI  Stable for transfer to floor today, 4/9.  Best Practice: (right click and "Reselect all SmartList Selections" daily)   Diet/type: Regular/Mechanical Soft DVT prophylaxis: LMWH GI prophylaxis: PPI Lines: N/A Foley:  N/A Code Status:  full code Last date of multidisciplinary goals of care discussion [Patient/daughter updated at bedside 4/9AM]  Critical care time: N/A   Faythe Ghee [facility] 10/26/22 8:00 AM  Please see [email or web address] for pager details.  From 7A-7P if no response, please call [phone] After hours, please call ELink [phone]

## 2022-10-27 DIAGNOSIS — T783XXA Angioneurotic edema, initial encounter: Secondary | ICD-10-CM | POA: Diagnosis not present

## 2022-10-27 LAB — CBC
HCT: 34.7 % — ABNORMAL LOW (ref 39.0–52.0)
Hemoglobin: 11.7 g/dL — ABNORMAL LOW (ref 13.0–17.0)
MCH: 31.8 pg (ref 26.0–34.0)
MCHC: 33.7 g/dL (ref 30.0–36.0)
MCV: 94.3 fL (ref 80.0–100.0)
Platelets: 165 10*3/uL (ref 150–400)
RBC: 3.68 MIL/uL — ABNORMAL LOW (ref 4.22–5.81)
RDW: 13 % (ref 11.5–15.5)
WBC: 11.4 10*3/uL — ABNORMAL HIGH (ref 4.0–10.5)
nRBC: 0 % (ref 0.0–0.2)

## 2022-10-27 LAB — BASIC METABOLIC PANEL
Anion gap: 5 (ref 5–15)
BUN: 17 mg/dL (ref 6–20)
CO2: 28 mmol/L (ref 22–32)
Calcium: 8.9 mg/dL (ref 8.9–10.3)
Chloride: 104 mmol/L (ref 98–111)
Creatinine, Ser: 0.87 mg/dL (ref 0.61–1.24)
GFR, Estimated: >60 mL/min (ref >60)
Glucose, Bld: 121 mg/dL — ABNORMAL HIGH (ref 70–99)
Potassium: 3.6 mmol/L (ref 3.5–5.1)
Sodium: 137 mmol/L (ref 135–145)

## 2022-10-27 LAB — MAGNESIUM: Magnesium: 2 mg/dL (ref 1.7–2.4)

## 2022-10-27 LAB — CULTURE, RESPIRATORY W GRAM STAIN

## 2022-10-27 LAB — PHOSPHORUS: Phosphorus: 2.2 mg/dL — ABNORMAL LOW (ref 2.5–4.6)

## 2022-10-27 MED ORDER — CLOPIDOGREL BISULFATE 75 MG PO TABS: 75.0000 mg | ORAL_TABLET | Freq: Every day | ORAL | 3 refills | Status: DC

## 2022-10-27 MED ORDER — AMLODIPINE BESYLATE 10 MG PO TABS: 10.0000 mg | ORAL_TABLET | Freq: Every day | ORAL | Status: DC

## 2022-10-27 MED ORDER — POTASSIUM PHOSPHATES 15 MMOLE/5ML IV SOLN
15.0000 mmol | Freq: Once | INTRAVENOUS | Status: AC
  Administered 2022-10-27: 15 mmol via INTRAVENOUS
  Filled 2022-10-27: qty 5

## 2022-10-27 MED ORDER — PREDNISONE 20 MG PO TABS: 40.0000 mg | ORAL_TABLET | Freq: Every day | ORAL | 0 refills | Status: DC

## 2022-10-27 MED ORDER — AMLODIPINE BESYLATE 10 MG PO TABS
10.0000 mg | ORAL_TABLET | Freq: Every day | ORAL | Status: DC
Start: 2022-10-27 — End: 2023-04-27

## 2022-10-27 MED ORDER — AMLODIPINE BESYLATE 10 MG PO TABS
10.0000 mg | ORAL_TABLET | Freq: Every day | ORAL | Status: DC
Start: 2022-10-27 — End: 2022-10-27

## 2022-10-27 NOTE — Progress Notes (Signed)
Patient being discharged to home, self care. Patient alert and oriented x4. PIVs removed, AVS discussed, pt verbalized understanding. Patient left the unit well appearing and in NAD. Patient will call tomorrow to follow up on a return to work note, charge nurse aware.

## 2022-10-27 NOTE — Plan of Care (Signed)

## 2022-10-27 NOTE — Progress Notes (Signed)
Physical Therapy Treatment Patient Details Name: Shane Valentine MRN: [id number] DOB: 02-Mar-1966 Today's Date: 10/27/2022   History of Present Illness Pt is a 57 y.o. M who presents 10/24/2022 with lip and tongue swelling, admitted with angioedema likely in the setting of ACE inhibitor requiring endotracheal intubation. Extubated 4/8. Significant PMH: HTN, HLD, tobacco dependence.    PT Comments    Pt was received ambulating with mobility specialist and agreeable to session. Pt demonstrating improved activity tolerance with increased gait distance and stair trial this session. Pt able to demonstrate 3 safe stair trials with step over step technique and single rail with up to min guard for safety. Pt required intermittent cuing for safety due to pt suddenly pushing RW away to walk without it for a few steps. Pt continues to benefit from PT services to progress toward functional mobility goals.     Recommendations for follow up therapy are one component of a multi-disciplinary discharge planning process, led by the attending physician.  Recommendations may be updated based on patient status, additional functional criteria and insurance authorization.  Follow Up Recommendations       Assistance Recommended at Discharge PRN  Patient can return home with the following A little help with walking and/or transfers;Assistance with cooking/housework;Help with stairs or ramp for entrance;Assist for transportation   Equipment Recommendations  None recommended by PT    Recommendations for Other Services       Precautions / Restrictions Precautions Precautions: Fall Restrictions Weight Bearing Restrictions: No     Mobility  Bed Mobility Overal bed mobility: Modified Independent                  Transfers                   General transfer comment: Pt walking with mobility upon arrival    Ambulation/Gait Ambulation/Gait assistance: Supervision Gait Distance (Feet): 300  Feet Assistive device: Rolling walker (2 wheels) Gait Pattern/deviations: Step-through pattern, Trunk flexed       General Gait Details: Cues for upright posture and safety due to pt pushing the RW away to walk without it for a few steps.   Stairs Stairs: Yes Stairs assistance: Min guard, Supervision Stair Management: One rail Left, Forwards Number of Stairs: 6 (x3) General stair comments: Pt demonstrating 3 stair trials with step over step technique. initial min guard progressing to supervision.      Balance Overall balance assessment: Mild deficits observed, not formally tested                                          Cognition Arousal/Alertness: Awake/alert Behavior During Therapy: WFL for tasks assessed/performed Overall Cognitive Status: Within Functional Limits for tasks assessed                                          Exercises      General Comments General comments (skin integrity, edema, etc.): VSS on RA      Pertinent Vitals/Pain Pain Assessment Pain Assessment: No/denies pain     PT Goals (current goals can now be found in the care plan section) Acute Rehab PT Goals Patient Stated Goal: get better PT Goal Formulation: With patient Time For Goal Achievement: 11/08/22 Potential to Achieve Goals: Good Progress towards  PT goals: Progressing toward goals    Frequency    Min 3X/week      PT Plan Current plan remains appropriate       AM-PAC PT "6 Clicks" Mobility   Outcome Measure  Help needed turning from your back to your side while in a flat bed without using bedrails?: None Help needed moving from lying on your back to sitting on the side of a flat bed without using bedrails?: None Help needed moving to and from a bed to a chair (including a wheelchair)?: A Little Help needed standing up from a chair using your arms (e.g., wheelchair or bedside chair)?: A Little Help needed to walk in hospital room?: A  Little Help needed climbing 3-5 steps with a railing? : A Little 6 Click Score: 20    End of Session Equipment Utilized During Treatment: Gait belt Activity Tolerance: Patient tolerated treatment well Patient left: with call bell/phone within reach;in bed Nurse Communication: Mobility status PT Visit Diagnosis: Unsteadiness on feet (R26.81);Difficulty in walking, not elsewhere classified (R26.2)     Time: 6712-4580 PT Time Calculation (min) (ACUTE ONLY): 15 min  Charges:  $Gait Training: 8-22 mins                     Johny Shock, PTA Acute Rehabilitation Services Secure Chat Preferred  Office:([phone]    Johny Shock 10/27/2022, 12:23 PM

## 2022-10-27 NOTE — Discharge Summary (Cosign Needed Addendum)
Physician Discharge Summary   Patient ID: Shane Valentine MRN: [id number] DOB/AGE: [birth year] 57 y.o.  Admit date: 10/24/2022 Discharge date: 10/27/2022                     Discharge Plan by Diagnosis   Angioedema - most likely 2/2 ACEi (Lisinopril). - HE HAS BEEN INSTRUCTED TO NEVE TAKE LISINOPRIL AGAIN (HE CONFIRMS UNDERSTANDING) AND IT HAS ALSO BEEN LISTED ON HIS ALLERGY LIST. - Continue Prednisone 40mg  daily x 2 more days.  Acute respiratory insufficiency with inability to protect the airway - s/p intubation on admit followed by successful extubation 4/8. Possible aspiration PNA - doesn't appear clinically so at this point. - Supportive care. - D/c Unasyn (received 3 days therapy).  Hx HTN, HLD, PVD. - Continue home Amlodipine, dose increased from 5mg  to 10mg  daily. - Continue home ASA, Plavix, Rosuvastatin.  Hx GERD. - Continue home PPI.   Discharge Summary  Shane Valentine is a 57 y.o. y/o male with a PMH HTN, HLD, GERD, Tobacco dependence, CVA. He presented to [facility] ED 4/7 with significant swelling of his tongue that occurred overnight. Of note, patient been on Lisinopril for some time without issue. No hives or itching present.     On presentation to ED, patient was given epi, steroids, Pepcid and Benadryl; however, edema worsened to the point he could not tolerate his own secretions.   PCCM and ENT were called emergently to the ED for assistance with intubation. Patient ultimately was taken to the OR for awake fiberoptic transoral intubation with plan for tracheostomy if transoral airway unable to be obtained. Patient was successfully orally intubated by anesthesia.   He required brief vasopressor support which was felt to be 2/2 sedation related hypotension. He was successfully extubated 4/8 and on 4/9 he was transferred out of the ICU.  On 4/10, he was deemed medically stable and was cleared for discharge home.  HE HAS BEEN INSTRUCTED TO NEVE TAKE LISINOPRIL AGAIN (HE CONFIRMS  UNDERSTANDING) AND IT HAS ALSO BEEN LISTED ON HIS ALLERGY LIST.         Significant Hospital Events   4/7 Presented to ED with angioedema. Intubated (transorally) in OR with anesthesia/ENT. 4/8 Extubated 4/9 Transfer out of ICU 4/10 Discharge  Micro Data  Sputum 4/7 > neg.  Antimicrobials  Unasyn 4/7 > 4/10.  Consults  ENT.  Objective:  Blood pressure 129/79, pulse 60, temperature 98.1 F (36.7 C), temperature source Oral, resp. rate 14, height 5\' 8"  (1.727 m), weight 78.2 kg, SpO2 99 %.        Intake/Output Summary (Last 24 hours) at 10/27/2022 1749 Last data filed at 10/27/2022 1510 Gross per 24 hour  Intake 365.25 ml  Output 400 ml  Net -34.75 ml   Filed Weights   10/25/22 0500 10/26/22 0500 10/27/22 0500  Weight: 79.2 kg 78.2 kg 78.2 kg    Physical Examination: General: Adult male, resting in bed, in NAD. Neuro: A&O x 3, non-focal.  HEENT: [place]/AT. EOMI, sclerae anicteric. No sensation of airway/tongue edema, no dysphagia. Strong cough/gag. Cardiovascular: RRR, no M/R/G.  Lungs: Respirations even and unlabored.  CTA bilaterally, No W/R/R. Abdomen: BS x 4, soft, NT/ND.  Musculoskeletal: No gross deformities, no edema.  Skin: Intact, warm, no rashes.   Discharge Labs:  BMET Recent Labs  Lab 10/24/22 0526 10/25/22 0616 10/26/22 0851 10/27/22 0045  NA 138 140 138 137  K 4.2 3.9 3.7 3.6  CL 101 105 105 104  CO2  24 23 27 28   GLUCOSE 98 120* 105* 121*  BUN 21* 26* 20 17  CREATININE 1.28* 1.09 0.84 0.87  CALCIUM 9.5 8.8* 8.8* 8.9  MG  --  1.9 1.9 2.0  PHOS  --  4.6 2.1* 2.2*    CBC Recent Labs  Lab 10/25/22 0616 10/26/22 0851 10/27/22 0045  HGB 12.0* 12.1* 11.7*  HCT 35.4* 36.7* 34.7*  WBC 11.5* 11.9* 11.4*  PLT 164 170 165    Anti-Coagulation No results for input(s): "INR" in the last 168 hours.        Allergies as of 10/27/2022       Reactions   Ace Inhibitors Anaphylaxis   Possible angioedema 10/24/22        Medication List      TAKE these medications    amLODipine 10 MG tablet Commonly known as: NORVASC Take 1 tablet (10 mg total) by mouth daily. What changed:  medication strength how much to take   clopidogrel 75 MG tablet Commonly known as: PLAVIX Take 1 tablet (75 mg total) by mouth daily.   pantoprazole 40 MG tablet Commonly known as: PROTONIX Take 1 tablet (40 mg total) by mouth daily.   predniSONE 20 MG tablet Commonly known as: DELTASONE Take 2 tablets (40 mg total) by mouth daily with breakfast. Start taking on: October 28, 2022   rosuvastatin 20 MG tablet Commonly known as: CRESTOR Take 1 tablet (20 mg total) by mouth daily.         Disposition: Home.   Discharge Condition:  Shane Valentine has met maximum benefit of inpatient care and is medically stable and cleared for discharge.  Patient is pending follow up as above.    Time spent on discharge: 30 minutes.   Rutherford Guys, PA - [facility] For pager details, please see AMION If no response to pager, please call [phone] until 7:00 PM After 7:00 PM, please call Elink at [phone] 10/27/2022, 5:49 PM

## 2022-10-27 NOTE — Progress Notes (Signed)
Patient seen, discharged per CCM.  Phosphorus 2.2 this morning.  Will give 1 dose of K-Phos 15 mmol x 1 before discharge.

## 2022-10-27 NOTE — Progress Notes (Signed)
   10/27/22 1200  Mobility  Activity Ambulated with assistance in hallway  Level of Assistance Standby assist, set-up cues, supervision of patient - no hands on  Assistive Device Front wheel walker  Distance Ambulated (ft) 300 ft  Activity Response Tolerated well  Mobility Referral Yes  $Mobility charge 1 Mobility   Mobility Specialist Progress Note  Pt was in bed and agreeable. Had no c/o pain. Returned to room w/ all needs met and call bell in reach  Anastasia Pall Mobility Specialist  Please contact via Special educational needs teacher or Rehab office at [phone]

## 2022-10-28 ENCOUNTER — Telehealth: Payer: Self-pay

## 2022-10-28 NOTE — Transitions of Care (Post Inpatient/ED Visit) (Signed)
   10/28/2022  Name: Shane Valentine MRN: [id number] DOB: Jan 07, 1966  Today's TOC FU Call Status: Today's TOC FU Call Status:: Successful TOC FU Call Competed TOC FU Call Complete Date: 10/28/22  Transition Care Management Follow-up Telephone Call Date of Discharge: 10/27/22 Discharge Facility: [facility]) Type of Discharge: Inpatient Admission Primary Inpatient Discharge Diagnosis:: Angioedema How have you been since you were released from the hospital?: Better Any questions or concerns?: No  Items Reviewed: Did you receive and understand the discharge instructions provided?: Yes Medications obtained and verified?: Yes (Medications Reviewed) Any new allergies since your discharge?: No Dietary orders reviewed?: NA Do you have support at home?: Yes  Home Care and Equipment/Supplies: Were Home Health Services Ordered?: NA Any new equipment or medical supplies ordered?: NA  Functional Questionnaire: Do you need assistance with bathing/showering or dressing?: No Do you need assistance with meal preparation?: No Do you need assistance with eating?: No Do you have difficulty maintaining continence: No Do you need assistance with getting out of bed/getting out of a chair/moving?: No Do you have difficulty managing or taking your medications?: No  Follow up appointments reviewed: PCP Follow-up appointment confirmed?: Yes Date of PCP follow-up appointment?: 10/28/22 Follow-up Provider: Crosby Oyster, [facility] Follow-up appointment confirmed?: NA Do you need transportation to your follow-up appointment?: No Do you understand care options if your condition(s) worsen?: Yes-patient verbalized understanding    Agnes Lawrence, CMA (AAMA)  [facility] [phone]

## 2022-10-29 ENCOUNTER — Encounter: Payer: Self-pay | Admitting: Medical

## 2022-10-29 ENCOUNTER — Ambulatory Visit: Payer: BC Managed Care – PPO | Admitting: Medical

## 2022-10-29 VITALS — BP 122/70 | HR 85 | Wt 177.0 lb

## 2022-10-29 DIAGNOSIS — I809 Phlebitis and thrombophlebitis of unspecified site: Secondary | ICD-10-CM

## 2022-10-29 DIAGNOSIS — K219 Gastro-esophageal reflux disease without esophagitis: Secondary | ICD-10-CM

## 2022-10-29 DIAGNOSIS — T783XXD Angioneurotic edema, subsequent encounter: Secondary | ICD-10-CM

## 2022-10-29 DIAGNOSIS — M25561 Pain in right knee: Secondary | ICD-10-CM | POA: Diagnosis not present

## 2022-10-29 DIAGNOSIS — Z87891 Personal history of nicotine dependence: Secondary | ICD-10-CM

## 2022-10-29 DIAGNOSIS — I10 Essential (primary) hypertension: Secondary | ICD-10-CM | POA: Diagnosis not present

## 2022-10-29 DIAGNOSIS — E785 Hyperlipidemia, unspecified: Secondary | ICD-10-CM

## 2022-10-29 DIAGNOSIS — I739 Peripheral vascular disease, unspecified: Secondary | ICD-10-CM

## 2022-10-29 DIAGNOSIS — G8929 Other chronic pain: Secondary | ICD-10-CM

## 2022-10-29 MED ORDER — ROSUVASTATIN CALCIUM 20 MG PO TABS: 20.0000 mg | ORAL_TABLET | Freq: Every day | ORAL | 1 refills | Status: DC

## 2022-10-29 MED ORDER — PANTOPRAZOLE SODIUM 40 MG PO TBEC: 40.0000 mg | DELAYED_RELEASE_TABLET | Freq: Every day | ORAL | 3 refills | Status: DC

## 2022-10-29 MED ORDER — EPINEPHRINE 0.3 MG/0.3ML IJ SOAJ: 0.3000 mg | INTRAMUSCULAR | 0 refills | Status: DC | PRN

## 2022-10-29 NOTE — Patient Instructions (Signed)
Please go to [facility] for your right knee xray.   Their hours are 8am - 4:30 pm Monday - Friday.  Take your insurance card with you.  [facility] [phone]   [address] [place], [place] [address]

## 2022-10-29 NOTE — Progress Notes (Signed)
Subjective:  Shane Valentine is a 57 y.o. male who presents for Chief Complaint  Patient presents with   Hospitalization Follow-up    Hospital follow-up, having pain where the IV was placed- and has knot under skin. Also his right knee pain needs refill on crestor and protonix      Here for hospitalization follow-up.  He was hospitalized April 7 through October 27, 2022.  He had went to bed the night before the seventh and ended up getting up in the melanite was hungry.  He ended up eating some canned salmon which she had never eaten before.  Hours later he started getting tongue swelling and swelling inside the mouth.  He went to the hospital where he was found to have angioedema and respiratory insufficiency with an inability to protect airway.  He was intubated.  He was given EpiPen, Benadryl, antibiotics for possible aspiration pneumonia.  He was already on lisinopril at that time.  By the time of discharge he was resolved.  He is not convinced the lisinopril caused angioedema.  He thinks it was the canned salmon.  Today he notes some irritation and a knot under the skin of the left forearm where he had IVs.  He had multiple IVs in place.  He is needing a refill of Crestor and Protonix to a different pharmacy as he is changing pharmacies.  He notes that he quit smoking on the seventh.  He is going to try to remain abstinent from tobacco  He notes some right knee pain for the past month.  No swelling, no injury trauma or fall.  Just are hurting but is kind of persisted.  He is some better today compared to the last week.  No numbness tingling or weakness.  No other aggravating or relieving factors.    No other c/o.  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 90 tablet 01   clopidogrel (PLAVIX) 75 MG tablet Take 1 tablet (75 mg total) by mouth daily. 90 tablet 3   predniSONE (DELTASONE) 20 MG tablet Take 2 tablets (40 mg total) by mouth daily with breakfast. 4 tablet 0   No current facility-administered medications on file prior to visit.     The following portions of the patient's history were reviewed and updated as appropriate: allergies, current medications, past family history, past medical history, past social history, past surgical history and problem list.  ROS Otherwise as in subjective above  Objective: BP 122/70   Pulse 85   Wt 177 lb (80.3 kg)   BMI 26.91 kg/m   General appearance: alert, no distress, well developed, well nourished Neck: supple, no lymphadenopathy, no thyromegaly, no masses Heart: RRR, normal S1, S2, no murmurs Lungs: CTA bilaterally, no wheezes, rhonchi, or rales Right knee without obvious deformity swelling or tenderness, normal range of motion, no laxity  Left forearm anteriorly mid shaft with a subtle area of induration or not suggesting some possible phlebitis, there were several markings.  Recent  IVs at the hospital,, no fluctuance or warmth or redness pulses: 2+ radial pulses, 2+ pedal pulses, normal cap refill Ext: no edema   Assessment: Encounter Diagnoses  Name Primary?   Angioedema, subsequent encounter Yes   Former smoker    Chronic pain of right knee    Primary hypertension    PVD (peripheral vascular disease)    Hyperlipidemia, unspecified hyperlipidemia type    Gastroesophageal reflux disease, unspecified whether esophagitis present      Plan: Angioedema-resolved.  I reviewed his hospitalization notes, discharge summary, recommendations.  Medicines reconciled.   We discussed most likely this is  from the ACE inhibitor but he thinks it could have been from salmon.  We discussed avoidance of potential triggers.  Do not restart any ACE or ARB.  Prescribe EpiPen just in case for future reference.  We discussed proper use of EpiPen, Benadryl and contact the emergency department for future angioedema events.  Former smoker-hopefully he will remain abstinent.  As he just quit recently  Chronic pain of the knee -advise short-term ice, elevation, rest, and some Tylenol.  If symptoms persist we can start with an x-ray.  X-ray order placed just in case  Phlebitis left forearm-mild, advised heat, 2 baby aspirin's a day for 5 days then go back to baby aspirin once a day, and if any worsening signs in the next 5 to 7 days and let me know  Hyperlipidemia-continue statin  GERD-avoid reflux triggers,  refilledProtonix  Hypertension-doing fine on the increased dose of amlodipine    [name] was seen today for hospitalization follow-up.  Diagnoses and all orders for this visit:  Angioedema, subsequent encounter  Former smoker  Chronic pain of right knee -     DG Knee Complete 4 Views Right; Future  Primary hypertension  PVD (peripheral vascular disease)  Hyperlipidemia, unspecified hyperlipidemia type  Gastroesophageal reflux disease, unspecified whether esophagitis present  Other orders -     EPINEPHrine (EPIPEN 2-PAK) 0.3 mg/0.3 mL IJ SOAJ injection; Inject 0.3 mg into the muscle as needed for anaphylaxis. -     pantoprazole (PROTONIX) 40 MG tablet; Take 1 tablet (40 mg total) by mouth daily. -     rosuvastatin (CRESTOR) 20 MG tablet; Take 1 tablet (20 mg total) by mouth daily.    Follow up: prn

## 2022-11-12 ENCOUNTER — Other Ambulatory Visit (HOSPITAL_COMMUNITY): Payer: Self-pay

## 2022-11-12 ENCOUNTER — Ambulatory Visit: Payer: BC Managed Care – PPO | Admitting: Medical

## 2022-11-12 ENCOUNTER — Encounter: Payer: Self-pay | Admitting: Medical

## 2022-11-12 VITALS — BP 130/80 | HR 72 | Ht 68.5 in | Wt 183.8 lb

## 2022-11-12 DIAGNOSIS — I10 Essential (primary) hypertension: Secondary | ICD-10-CM

## 2022-11-12 DIAGNOSIS — E785 Hyperlipidemia, unspecified: Secondary | ICD-10-CM

## 2022-11-12 DIAGNOSIS — M79605 Pain in left leg: Secondary | ICD-10-CM

## 2022-11-12 DIAGNOSIS — G8929 Other chronic pain: Secondary | ICD-10-CM

## 2022-11-12 DIAGNOSIS — S9031XA Contusion of right foot, initial encounter: Secondary | ICD-10-CM

## 2022-11-12 DIAGNOSIS — I809 Phlebitis and thrombophlebitis of unspecified site: Secondary | ICD-10-CM

## 2022-11-12 DIAGNOSIS — I739 Peripheral vascular disease, unspecified: Secondary | ICD-10-CM

## 2022-11-12 DIAGNOSIS — Z87891 Personal history of nicotine dependence: Secondary | ICD-10-CM

## 2022-11-12 DIAGNOSIS — M25561 Pain in right knee: Secondary | ICD-10-CM | POA: Diagnosis not present

## 2022-11-12 MED ORDER — ASPIRIN 325 MG PO TABS
325.0000 mg | ORAL_TABLET | Freq: Every day | ORAL | 0 refills | Status: DC
  Filled 2022-11-12: qty 30, 30d supply, fill #0

## 2022-11-12 MED ORDER — MELOXICAM 15 MG PO TABS
15.0000 mg | ORAL_TABLET | Freq: Every day | ORAL | 0 refills | Status: DC
  Filled 2022-11-12: qty 30, 30d supply, fill #0

## 2022-11-12 NOTE — Patient Instructions (Addendum)
Your left lower leg pain is suggestive of phlebitis or inflammation of the veins. For the next few days use heat to your leg such as a warm towel or warm wet rag or do some soaking in a hot bath Begin aspirin 325 mg daily for the next 5 to 7 days In the future if you get similar type of symptoms use aspirin for 5 to 7 days at a time for phlebitis Elevate legs some this weekend to rest the legs I will speak with Dr. Jacinto Halim about the venous blood flow in your legs   Right knee pain Arthritis is likely the cause of your pain For pain you can use topical creams such as Aspercreme or Voltaren gel On days when is worse you can use rest, leg elevation, cool therapy such as ice water pack for 15 to 20 minutes at a time You can use meloxicam prescription once daily as needed for arthritis pain Depending on your x-ray, sometimes we will offer steroid injection in the joint to calm down inflammation if the other remedies above do not help  Please go to [facility] for your right knee xray.   Their hours are 8am - 4:30 pm Monday - Friday.  Take your insurance card with you.  [facility] [phone]   [address] [place], [place] [address]   Continue routine medicines for blood pressure and cholesterol

## 2022-11-12 NOTE — Progress Notes (Signed)
Subjective:  Shane Valentine is a 57 y.o. male who presents for Chief Complaint  Patient presents with   Knee Pain    Right knee swollen. Left calf is numb, thinks he has a pinched nerve.     Here for couple concerns.  I saw him recently for hospital follow-up  Having some pain and sometimes numb in front of the left lower leg.  No injury or trauma.  No specific swelling.  There is some discoloration of the leg.  Last visit he discussed the knee pain which she is still having.  He did not go for x-rays last time.  This has been bothering him for the past months.  No injury or trauma.  No fall.  No numbness or tingling.  Lately has had some mild swelling.  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    No other c/o.  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 therapy, [place]    Past Surgical History:  Procedure Laterality Date   COLONOSCOPY     INGUINAL HERNIA REPAIR Left 03/29/2014   Procedure: LAPAROSCOPIC LEFT INGUINAL HERNIA REPAIR;  Surgeon: Shane Filler, MD;  Location: [facility] OR;  Service: General;  Laterality: Left;   INSERTION OF MESH Left 03/29/2014   Procedure: INSERTION OF MESH;  Surgeon: Shane Filler, MD;  Location: [facility] OR;  Service: General;  Laterality: Left;   INTUBATION-ENDOTRACHEAL WITH TRACHEOSTOMY STANDBY N/A 10/24/2022   Procedure: Shane Valentine;  Surgeon: Shane Dredge., MD;  Location: [facility] OR;  Service: ENT;  Laterality: N/A;   LOWER EXTREMITY ANGIOGRAPHY N/A 08/11/2021   Procedure: LOWER EXTREMITY ANGIOGRAPHY;  Surgeon: Shane Decamp, MD;  Location: [facility] INVASIVE CV LAB;   Service: Cardiovascular;  Laterality: N/A;   PERIPHERAL VASCULAR ATHERECTOMY Left 08/11/2021   Procedure: PERIPHERAL VASCULAR ATHERECTOMY;  Surgeon: Shane Decamp, MD;  Location: [facility] INVASIVE CV LAB;  Service: Cardiovascular;  Laterality: Left;   TUMOR EXCISION Bilateral 03/21/2020   Procedure: TUMOR EXCISION RECTAL, anal condyloma;  Surgeon: Shane Lerner, MD;  Location: [facility] ORS;  Service: General;  Laterality: Bilateral;   UPPER GASTROINTESTINAL ENDOSCOPY        The following portions of the patient's history were reviewed and updated as appropriate: allergies, current medications, past family history, past medical history, past social history, past surgical history and problem list.  ROS Otherwise as in subjective above     Objective: BP 130/80   Pulse 72   Ht 5' 8.5" (1.74 m)   Wt 183 lb 12.8 oz (83.4 kg)   SpO2 97%   BMI 27.54 kg/m    General appearance: alert, no distress, well developed, well nourished Right knee with some tenderness over the superior patella, otherwise nontender to palpation, no obvious laxity, may be subtle minimal swelling of the knee joint, he cannot fully bend the knee due to pain, rest of leg nontender with relatively normal range of motion except for the heel on the right Right heel with a 1.5 cm diameter callus with some bruising underneath Bilateral lower extremities  with mild to moderate varicose veins Tender in the left lower anterior leg in an area of palpable varicose veins suggesting phlebitis, there is some round spots of discoloration as well as some deeper coloration changes in the distal lower leg anteriorly suggestive of venous stasis disease 1+ pedal pulses bilaterally No other edema Lungs clear Heart regular rate and rhythm, normal S1-S2, no murmurs   Assessment: Encounter Diagnoses  Name Primary?   Chronic pain of right knee Yes   Contusion of right foot, initial encounter    Left leg pain    Phlebitis    Former smoker     PVD (peripheral vascular disease) (HCC)    Primary hypertension    Hyperlipidemia, unspecified hyperlipidemia type      Plan: We discussed symptoms and concerns, exam findings and recommendations as below  Patient Instructions  Your left lower leg pain is suggestive of phlebitis or inflammation of the veins. For the next few days use heat to your leg such as a warm towel or warm wet rag or do some soaking in a hot bath Begin aspirin 325 mg daily for the next 5 to 7 days In the future if you get similar type of symptoms use aspirin for 5 to 7 days at a time for phlebitis Elevate legs some this weekend to rest the legs I will speak with Shane Valentine about the venous blood flow in your legs   Right knee pain Arthritis is likely the cause of your pain For pain you can use topical creams such as Aspercreme or Voltaren gel On days when is worse you can use rest, leg elevation, cool therapy such as ice water pack for 15 to 20 minutes at a time You can use meloxicam prescription once daily as needed for arthritis pain Depending on your x-ray, sometimes we will offer steroid injection in the joint to calm down inflammation if the other remedies above do not help  Please go to [facility] for your right knee xray.   Their hours are 8am - 4:30 pm Monday - Friday.  Take your insurance card with you.  [facility] [phone]   [address] [place], [place] [address]   Continue routine medicines for blood pressure and cholesterol    [name] was seen today for knee pain.  Diagnoses and all orders for this visit:  Chronic pain of right knee  Contusion of right foot, initial encounter  Left leg pain  Phlebitis  Former smoker  PVD (peripheral vascular disease) (HCC)  Primary hypertension  Hyperlipidemia, unspecified hyperlipidemia type  Other orders -     aspirin (ASPIRIN ADULT) 325 MG tablet; Take 1 tablet (325 mg total) by mouth daily. -     meloxicam (MOBIC) 15  MG tablet; Take 1 tablet (15 mg total) by mouth daily.    Follow up: Pending x-ray and call back

## 2022-11-15 ENCOUNTER — Other Ambulatory Visit: Payer: Self-pay | Admitting: Medical

## 2022-11-15 DIAGNOSIS — I872 Venous insufficiency (chronic) (peripheral): Secondary | ICD-10-CM

## 2022-11-15 DIAGNOSIS — I809 Phlebitis and thrombophlebitis of unspecified site: Secondary | ICD-10-CM

## 2022-11-15 DIAGNOSIS — I739 Peripheral vascular disease, unspecified: Secondary | ICD-10-CM

## 2022-11-15 DIAGNOSIS — Z87891 Personal history of nicotine dependence: Secondary | ICD-10-CM

## 2022-12-07 ENCOUNTER — Other Ambulatory Visit: Payer: Self-pay | Admitting: *Deleted

## 2022-12-07 DIAGNOSIS — I739 Peripheral vascular disease, unspecified: Secondary | ICD-10-CM

## 2022-12-17 ENCOUNTER — Ambulatory Visit (HOSPITAL_COMMUNITY)
Admission: RE | Admit: 2022-12-17 | Discharge: 2022-12-17 | Disposition: A | Discharge status: Home / Self Care | Payer: BC Managed Care – PPO | Source: Ambulatory Visit | Attending: Vascular Surgery | Admitting: Vascular Surgery

## 2022-12-17 DIAGNOSIS — I739 Peripheral vascular disease, unspecified: Secondary | ICD-10-CM | POA: Insufficient documentation

## 2022-12-22 ENCOUNTER — Encounter: Payer: Self-pay | Admitting: Physician Assistant

## 2022-12-22 ENCOUNTER — Ambulatory Visit: Payer: BC Managed Care – PPO | Admitting: Physician Assistant

## 2022-12-22 VITALS — BP 149/100 | HR 69 | Temp 98.2°F | Resp 18 | Ht 68.5 in | Wt 189.2 lb

## 2022-12-22 DIAGNOSIS — M25561 Pain in right knee: Secondary | ICD-10-CM

## 2022-12-22 DIAGNOSIS — G8929 Other chronic pain: Secondary | ICD-10-CM

## 2022-12-22 NOTE — Progress Notes (Signed)
[facility] [place]   Reason for referral: Swollen right knee   History of Present Illness  Shane Valentine is a 57 y.o. male who presents with chief complaint: swollen leg.  Patient notes, onset of swelling 1 month ago, associated with hospitalization on 10/26/22.  The patient has had no history of DVT, no history of varicose vein, no history of venous stasis ulcers, no history of  Lymphedema and no history of skin changes in lower legs.  There is no family history of venous disorders.  The patient has no used compression stockings in the past.  He was seen by his PCP and prescribed 325 mg ASA for 5-7 days for Phlebitis of the right LE.   He was referred here to r/o venous reflux or DVT.  He states that when he was in the hospital they gave him a shot and since then he has had swelling on the lateral aspect of the right knee.   The swelling is localized.  He denies rest pain, non healing wounds or claudication.  He denies fever or chills.   Past Medical History:  Diagnosis Date   Arthritis    Genital warts    GERD (gastroesophageal reflux disease)    Headache    Hyperlipidemia    Hypertension    Paresthesia of hand, bilateral    Peripheral vascular disease (HCC)    Smoker    Stroke [facility])    Tuberculosis 1995   26mo therapy, [place]    Past Surgical History:  Procedure Laterality Date   COLONOSCOPY     INGUINAL HERNIA REPAIR Left 03/29/2014   Procedure: LAPAROSCOPIC LEFT INGUINAL HERNIA REPAIR;  Surgeon: Axel Filler, MD;  Location: [facility] OR;  Service: General;  Laterality: Left;   INSERTION OF MESH Left 03/29/2014   Procedure: INSERTION OF MESH;  Surgeon: Axel Filler, MD;  Location: [facility] OR;  Service: General;  Laterality: Left;   INTUBATION-ENDOTRACHEAL WITH TRACHEOSTOMY STANDBY N/A 10/24/2022   Procedure: Alcario Drought;  Surgeon: Trixie Dredge., MD;  Location: [facility] OR;  Service: ENT;  Laterality: N/A;   LOWER EXTREMITY ANGIOGRAPHY N/A  08/11/2021   Procedure: LOWER EXTREMITY ANGIOGRAPHY;  Surgeon: Yates Decamp, MD;  Location: [facility] INVASIVE CV LAB;  Service: Cardiovascular;  Laterality: N/A;   PERIPHERAL VASCULAR ATHERECTOMY Left 08/11/2021   Procedure: PERIPHERAL VASCULAR ATHERECTOMY;  Surgeon: Yates Decamp, MD;  Location: [facility] INVASIVE CV LAB;  Service: Cardiovascular;  Laterality: Left;   TUMOR EXCISION Bilateral 03/21/2020   Procedure: TUMOR EXCISION RECTAL, anal condyloma;  Surgeon: Campbell Lerner, MD;  Location: [facility] ORS;  Service: General;  Laterality: Bilateral;   UPPER GASTROINTESTINAL ENDOSCOPY      Social History   Socioeconomic History   Marital status: Single    Spouse name: Not on file   Number of children: 3   Years of education: Not on file   Highest education level: Not on file  Occupational History   Occupation: maintenance  Tobacco Use   Smoking status: Former    Packs/day: 0.50    Years: 23.00    Additional pack years: 0.00    Total pack years: 11.50    Types: Cigarettes    Quit date: 10/24/2022    Years since quitting: 0.1   Smokeless tobacco: Never  Vaping Use   Vaping Use: Never used  Substance and Sexual Activity   Alcohol use: No   Drug use: Yes    Types: Marijuana    Comment: LAST SMOKED 07/28/22  Sexual activity: Never  Other Topics Concern   Not on file  Social History Narrative   Uses jump rope and some dumbbells regularly.   No significant other.   Lives alone.   Works [employer], HVAC, Emergency planning/management officer.  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  12/2019   Social Determinants of Health   Financial Resource Strain: Not on file  Food Insecurity: Not on file  Transportation Needs: Not on file  Physical Activity: Not on file  Stress: Not on file  Social Connections: Not on file  Intimate Partner Violence: Not on file    Family History  Problem Relation Age of Onset   Diabetes Mother        amputations   Hypertension Mother    Cancer Father    COPD Brother     Cerebral aneurysm Brother    Heart disease Neg Hx    Stroke Neg Hx    Colon cancer Neg Hx    Colon polyps Neg Hx    Crohn's disease Neg Hx    Esophageal cancer Neg Hx    Rectal cancer Neg Hx    Stomach cancer Neg Hx    Ulcerative colitis Neg Hx     Current Outpatient Medications on File Prior to Visit  Medication Sig Dispense Refill   amLODipine (NORVASC) 10 MG tablet Take 1 tablet (10 mg total) by mouth daily. 90 tablet 01   aspirin (ASPIRIN ADULT) 325 MG tablet Take 1 tablet (325 mg total) by mouth daily. 30 tablet 0   clopidogrel (PLAVIX) 75 MG tablet Take 1 tablet (75 mg total) by mouth daily. 90 tablet 3   EPINEPHrine (EPIPEN 2-PAK) 0.3 mg/0.3 mL IJ SOAJ injection Inject 0.3 mg into the muscle as needed for anaphylaxis. (Patient not taking: Reported on 11/12/2022) 1 each 0   meloxicam (MOBIC) 15 MG tablet Take 1 tablet (15 mg total) by mouth daily. 30 tablet 0   pantoprazole (PROTONIX) 40 MG tablet Take 1 tablet (40 mg total) by mouth daily. 90 tablet 3   rosuvastatin (CRESTOR) 20 MG tablet Take 1 tablet (20 mg total) by mouth daily. 90 tablet 1   No current facility-administered medications on file prior to visit.    Allergies as of 12/22/2022 - Review Complete 11/12/2022  Allergen Reaction Noted   Ace inhibitors Anaphylaxis 10/24/2022     ROS:   General:  No weight loss, Fever, chills  HEENT: No recent headaches, no nasal bleeding, no visual changes, no sore throat  Neurologic: No dizziness, blackouts, seizures. No recent symptoms of stroke or mini- stroke. No recent episodes of slurred speech, or temporary blindness.  Cardiac: No recent episodes of chest pain/pressure, no shortness of breath at rest.  No shortness of breath with exertion.  Denies history of atrial fibrillation or irregular heartbeat  Vascular: No history of rest pain in feet.  No history of claudication.  No history of non-healing ulcer, No history of DVT   Pulmonary: No home oxygen, no productive  cough, no hemoptysis,  No asthma or wheezing  Musculoskeletal:  [x ] Arthritis, [ ]  Low back pain,  [ ]  Joint pain  Hematologic:No history of hypercoagulable state.  No history of easy bleeding.  No history of anemia  Gastrointestinal: No hematochezia or melena,  No gastroesophageal reflux, no trouble swallowing  Urinary: [ ]  chronic Kidney disease, [ ]  on HD - [ ]  MWF or [ ]  TTHS, [ ]  Burning with urination, [ ]  Frequent urination, [ ]   Difficulty urinating;   Skin: No rashes  Psychological: No history of anxiety,  No history of depression  Physical Examination  Vitals:   12/22/22 0842  BP: (!) 149/100  Pulse: 69  Resp: 18  Temp: 98.2 F (36.8 C)  TempSrc: Temporal  SpO2: 96%  Weight: 189 lb 3.2 oz (85.8 kg)  Height: 5' 8.5" (1.74 m)    Body mass index is 28.35 kg/m.  General:  Alert and oriented, no acute distress HEENT: Normal Neck: No bruit or JVD Pulmonary: Clear to auscultation bilaterally Cardiac: Regular Rate and Rhythm without murmur Abdomen: Soft, non-tender, non-distended, no mass, no scars Skin: No rash Extremity Pulses:  2+ radial, femoral, right dorsalis pedis, left posterior tibial pulses  Musculoskeletal: minimal right lateral knee edema, NTTP, no erythema  Neurologic: Upper and lower extremity motor 5/5 and symmetric  DATA: Venous Reflux Times  +--------------+---------+------+-----------+------------+--------+  RIGHT        Reflux NoRefluxReflux TimeDiameter cmsComments                          Yes                                   +--------------+---------+------+-----------+------------+--------+  CFV          no                                              +--------------+---------+------+-----------+------------+--------+  FV prox       no                                              +--------------+---------+------+-----------+------------+--------+  FV mid        no                                               +--------------+---------+------+-----------+------------+--------+  FV dist       no                                              +--------------+---------+------+-----------+------------+--------+  Popliteal    no                                              +--------------+---------+------+-----------+------------+--------+  GSV at SFJ    no                           0.711              +--------------+---------+------+-----------+------------+--------+  GSV prox thighno                            0.44              +--------------+---------+------+-----------+------------+--------+  GSV mid thigh no                           0.348              +--------------+---------+------+-----------+------------+--------+  GSV dist thighno                           0.493              +--------------+---------+------+-----------+------------+--------+  GSV at knee   no                           0.315              +--------------+---------+------+-----------+------------+--------+  GSV prox calf no                            0.26              +--------------+---------+------+-----------+------------+--------+  SSV Pop Fossa no                           0.256              +--------------+---------+------+-----------+------------+--------+  SSV prox calf           yes    >500 ms     0.237              +--------------+---------+------+-----------+------------+--------+  SSV mid calf            yes    >500 ms     0.201              +--------------+---------+------+-----------+------------+--------+     Summary:  Right:  - No evidence of deep vein thrombosis seen in the right lower extremity,  from the common femoral through the popliteal veins.  - No evidence of superficial venous thrombosis in the right lower  extremity.   - No evidence of deep vein reflux.   - Superficial vein reflux in the SSV.   - Fluid  collection at the right lateral distal thigh and knee measuring  approx 8 cm long.    Assessment/Plan: Right lateral knee pain after injection during hospitalization in April 2024 The reflux study is negative and he has palpable pedal pulses B.  I suggested ice PRN and compression wrap over the right knee to help with edema.  No vascular intervention is indicated. He is pending knee x ray which may provide additional information.  There does appear to be fluid around the right knee he may benefit from an orthopedic referral.    F/U PRN     Mosetta Pigeon PA-C [facility] [place] Office: [phone]  MD in clinic [place]

## 2023-04-26 ENCOUNTER — Encounter: Payer: Self-pay | Admitting: Internal Medicine

## 2023-04-26 ENCOUNTER — Other Ambulatory Visit: Payer: Self-pay | Admitting: Medical

## 2023-04-26 NOTE — Telephone Encounter (Signed)
Pt has an appt tomorrow °

## 2023-04-27 ENCOUNTER — Ambulatory Visit (INDEPENDENT_AMBULATORY_CARE_PROVIDER_SITE_OTHER): Payer: BC Managed Care – PPO | Admitting: Medical

## 2023-04-27 ENCOUNTER — Encounter: Payer: Self-pay | Admitting: Medical

## 2023-04-27 ENCOUNTER — Other Ambulatory Visit: Payer: Self-pay | Admitting: Medical

## 2023-04-27 VITALS — BP 122/86 | HR 65 | Wt 196.0 lb

## 2023-04-27 DIAGNOSIS — E611 Iron deficiency: Secondary | ICD-10-CM | POA: Insufficient documentation

## 2023-04-27 DIAGNOSIS — Z2821 Immunization not carried out because of patient refusal: Secondary | ICD-10-CM

## 2023-04-27 DIAGNOSIS — I739 Peripheral vascular disease, unspecified: Secondary | ICD-10-CM

## 2023-04-27 DIAGNOSIS — Z7185 Encounter for immunization safety counseling: Secondary | ICD-10-CM | POA: Diagnosis not present

## 2023-04-27 DIAGNOSIS — R7301 Impaired fasting glucose: Secondary | ICD-10-CM

## 2023-04-27 DIAGNOSIS — I10 Essential (primary) hypertension: Secondary | ICD-10-CM

## 2023-04-27 DIAGNOSIS — E785 Hyperlipidemia, unspecified: Secondary | ICD-10-CM

## 2023-04-27 DIAGNOSIS — Z87891 Personal history of nicotine dependence: Secondary | ICD-10-CM

## 2023-04-27 DIAGNOSIS — Z8673 Personal history of transient ischemic attack (TIA), and cerebral infarction without residual deficits: Secondary | ICD-10-CM

## 2023-04-27 MED ORDER — MELOXICAM 15 MG PO TABS: 15.0000 mg | ORAL_TABLET | Freq: Every day | ORAL | 0 refills | Status: DC

## 2023-04-27 MED ORDER — ROSUVASTATIN CALCIUM 20 MG PO TABS: 20.0000 mg | ORAL_TABLET | Freq: Every day | ORAL | 3 refills | Status: DC

## 2023-04-27 MED ORDER — ASPIRIN 325 MG PO TABS: 325.0000 mg | ORAL_TABLET | Freq: Every day | ORAL | 3 refills | Status: DC

## 2023-04-27 MED ORDER — AMLODIPINE BESYLATE 10 MG PO TABS
10.0000 mg | ORAL_TABLET | Freq: Every day | ORAL | 3 refills | Status: DC
Start: 2023-04-27 — End: 2024-02-02

## 2023-04-27 NOTE — Progress Notes (Signed)
Subjective:  Shane Valentine is a 57 y.o. male who presents for Chief Complaint  Patient presents with   Medical Management of Chronic Issues    Fasting med check. No concerns, declines shots today     Patient Care Team: Matilde Markie, Kermit Balo, PA-C as PCP - General (Family Medicine) Clinton Gallant, PA, vascular surgery Dr. Tiajuana Amass, GI Dr. Yates Decamp, cardiology Dr. Campbell Lerner, general surgery   Hyperlipidemia-compliant with rosuvastatin Crestor 20 mg daily, aspirin 325 mg daily  Hypertension-compliant with amlodipine 10 mg daily  History of stroke-compliant with Plavix 75 mg daily  Former smoker, quit 4 months ago.  Still has some right knee pain, but improved.  Wants to use omega xl otc   No other aggravating or relieving factors.    No other c/o.  Past Medical History:  Diagnosis Date   Arthritis    Genital warts    GERD (gastroesophageal reflux disease)    Headache    Hyperlipidemia    Hypertension    Paresthesia of hand, bilateral    Peripheral vascular disease (HCC)    Smoker    Stroke (HCC)    Tuberculosis 1995   32mo therapy, [place]   Current Outpatient Medications on File Prior to Visit  Medication Sig Dispense Refill   clopidogrel (PLAVIX) 75 MG tablet Take 1 tablet (75 mg total) by mouth daily. 90 tablet 3   pantoprazole (PROTONIX) 40 MG tablet Take 1 tablet (40 mg total) by mouth daily. 90 tablet 3   EPINEPHrine (EPIPEN 2-PAK) 0.3 mg/0.3 mL IJ SOAJ injection Inject 0.3 mg into the muscle as needed for anaphylaxis. (Patient not taking: Reported on 11/12/2022) 1 each 0   No current facility-administered medications on file prior to visit.    The following portions of the patient's history were reviewed and updated as appropriate: allergies, current medications, past family history, past medical history, past social history, past surgical history and problem list.  ROS Otherwise as in subjective above  Objective: BP 122/86   Pulse 65   Wt  196 lb (88.9 kg)   BMI 29.37 kg/m   General appearance: alert, no distress, well developed, well nourished Neck: supple, no lymphadenopathy, no thyromegaly, no masses Heart: RRR, normal S1, S2, no murmurs Lungs: CTA bilaterally, no wheezes, rhonchi, or rales Pulses: 2+ radial pulses, 2+ pedal pulses, normal cap refill Ext: no edema   Assessment: Encounter Diagnoses  Name Primary?   Primary hypertension Yes   Impaired fasting blood sugar    Hyperlipidemia, unspecified hyperlipidemia type    Vaccine counseling    PVD (peripheral vascular disease) (HCC)    History of stroke    Iron deficiency    Low phosphate levels    Former smoker    Influenza vaccination declined      Plan: Hypertension-continue amlodipine 10 mg daily  History of stroke-continue Plavix 75 mg daily and antihypertensive and antihyperlipidemic medication  Hyperlipidemia-continue rosuvastatin Crestor 20 mg daily and aspirin 325 mg daily  Peripheral vascular disease-continue medications above  Updated labs as below given chronic issues above and prior iron deficiency and low phosphate levels   Echocardiogram 07/26/2022: Left ventricle cavity is normal in size. Normal LV systolic function with EF 60%. Mild concentric hypertrophy of the left ventricle. Normal global wall motion. Doppler evidence of grade I (impaired) diastolic dysfunction, normal LAP. Aneurysmal interatrial septum without 2D or color Doppler evidence of shunting. Structurally normal trileaflet aortic valve.  Moderate (Grade II) aortic regurgitation. The aortic root is dilated,  measuring 4.1 cm at sinus of Valsalva. No evidence of pulmonary hypertension. No significant change compared to previous study on 06/03/2021.     Vaccine recommendations: Shingles vaccine, yearly flu shot, COVID booster  You declined these today  I wished him happy birthday today!   Shane Valentine was seen today for medical management of chronic issues.  Diagnoses and  all orders for this visit:  Primary hypertension -     VITAMIN D 25 Hydroxy (Vit-D Deficiency, Fractures) -     amLODipine (NORVASC) 10 MG tablet; Take 1 tablet (10 mg total) by mouth daily.  Impaired fasting blood sugar -     Hemoglobin A1c -     VITAMIN D 25 Hydroxy (Vit-D Deficiency, Fractures)  Hyperlipidemia, unspecified hyperlipidemia type -     Lipid panel  Vaccine counseling  PVD (peripheral vascular disease) (HCC)  History of stroke  Iron deficiency -     Iron, TIBC and Ferritin Panel  Low phosphate levels -     VITAMIN D 25 Hydroxy (Vit-D Deficiency, Fractures)  Former smoker  Influenza vaccination declined  Other orders -     rosuvastatin (CRESTOR) 20 MG tablet; Take 1 tablet (20 mg total) by mouth daily. -     aspirin (ASPIRIN ADULT) 325 MG tablet; Take 1 tablet (325 mg total) by mouth daily. -     meloxicam (MOBIC) 15 MG tablet; Take 1 tablet (15 mg total) by mouth daily.   Follow up: pending labs

## 2023-04-28 ENCOUNTER — Other Ambulatory Visit: Payer: Self-pay | Admitting: Medical

## 2023-04-28 LAB — IRON,TIBC AND FERRITIN PANEL
Ferritin: 399 ng/mL (ref 30–400)
Iron Saturation: 14 % — ABNORMAL LOW (ref 15–55)
Iron: 38 ug/dL (ref 38–169)
Total Iron Binding Capacity: 276 ug/dL (ref 250–450)
UIBC: 238 ug/dL (ref 111–343)

## 2023-04-28 LAB — LIPID PANEL
Chol/HDL Ratio: 3.1 {ratio} (ref 0.0–5.0)
Cholesterol, Total: 181 mg/dL (ref 100–199)
HDL: 59 mg/dL (ref >39)
LDL Chol Calc (NIH): 103 mg/dL — ABNORMAL HIGH (ref 0–99)
Triglycerides: 105 mg/dL (ref 0–149)
VLDL Cholesterol Cal: 19 mg/dL (ref 5–40)

## 2023-04-28 LAB — HEMOGLOBIN A1C
Est. average glucose Bld gHb Est-mCnc: 114 mg/dL
Hgb A1c MFr Bld: 5.6 % (ref 4.8–5.6)

## 2023-04-28 LAB — VITAMIN D 25 HYDROXY (VIT D DEFICIENCY, FRACTURES): Vit D, 25-Hydroxy: 23.9 ng/mL — ABNORMAL LOW (ref 30.0–100.0)

## 2023-04-28 MED ORDER — FERROUS GLUCONATE 324 (38 FE) MG PO TABS: 324.0000 mg | ORAL_TABLET | Freq: Every day | ORAL | 3 refills | Status: DC

## 2023-04-28 MED ORDER — VITAMIN D (ERGOCALCIFEROL) 1.25 MG (50000 UNIT) PO CAPS: 50000.0000 [IU] | ORAL_CAPSULE | ORAL | 3 refills | Status: DC

## 2023-04-28 NOTE — Progress Notes (Signed)
Results sent through MyChart

## 2023-06-23 ENCOUNTER — Ambulatory Visit: Payer: BC Managed Care – PPO

## 2023-06-23 ENCOUNTER — Other Ambulatory Visit (HOSPITAL_COMMUNITY): Payer: BC Managed Care – PPO

## 2023-06-23 ENCOUNTER — Encounter (HOSPITAL_COMMUNITY): Payer: BC Managed Care – PPO

## 2023-08-01 ENCOUNTER — Other Ambulatory Visit: Payer: Self-pay | Admitting: Medical

## 2023-10-27 ENCOUNTER — Other Ambulatory Visit: Payer: Self-pay | Admitting: Medical

## 2023-11-08 ENCOUNTER — Other Ambulatory Visit: Payer: Self-pay | Admitting: Medical

## 2024-01-30 ENCOUNTER — Other Ambulatory Visit: Payer: Self-pay | Admitting: Medical

## 2024-01-30 NOTE — Telephone Encounter (Signed)
 Cpe scheduled for 01/31/2024

## 2024-01-30 NOTE — Telephone Encounter (Signed)
 Patient is due for CPE per 04/2023 visit. Please schedule

## 2024-01-31 ENCOUNTER — Ambulatory Visit: Admitting: Medical

## 2024-01-31 ENCOUNTER — Encounter: Payer: Self-pay | Admitting: Medical

## 2024-01-31 VITALS — BP 136/82 | HR 73 | Ht 68.0 in | Wt 191.4 lb

## 2024-01-31 DIAGNOSIS — R252 Cramp and spasm: Secondary | ICD-10-CM | POA: Insufficient documentation

## 2024-01-31 DIAGNOSIS — K219 Gastro-esophageal reflux disease without esophagitis: Secondary | ICD-10-CM

## 2024-01-31 DIAGNOSIS — Z125 Encounter for screening for malignant neoplasm of prostate: Secondary | ICD-10-CM | POA: Diagnosis not present

## 2024-01-31 DIAGNOSIS — E611 Iron deficiency: Secondary | ICD-10-CM

## 2024-01-31 DIAGNOSIS — Z0000 Encounter for general adult medical examination without abnormal findings: Secondary | ICD-10-CM | POA: Diagnosis not present

## 2024-01-31 DIAGNOSIS — E785 Hyperlipidemia, unspecified: Secondary | ICD-10-CM

## 2024-01-31 DIAGNOSIS — R7301 Impaired fasting glucose: Secondary | ICD-10-CM | POA: Insufficient documentation

## 2024-01-31 DIAGNOSIS — Z7185 Encounter for immunization safety counseling: Secondary | ICD-10-CM

## 2024-01-31 DIAGNOSIS — I739 Peripheral vascular disease, unspecified: Secondary | ICD-10-CM

## 2024-01-31 DIAGNOSIS — I10 Essential (primary) hypertension: Secondary | ICD-10-CM | POA: Diagnosis not present

## 2024-01-31 DIAGNOSIS — K22719 Barrett's esophagus with dysplasia, unspecified: Secondary | ICD-10-CM | POA: Insufficient documentation

## 2024-01-31 LAB — POCT URINALYSIS DIP (PROADVANTAGE DEVICE)
Bilirubin, UA: NEGATIVE
Blood, UA: NEGATIVE
Glucose, UA: NEGATIVE mg/dL
Ketones, POC UA: NEGATIVE mg/dL
Leukocytes, UA: NEGATIVE
Nitrite, UA: NEGATIVE
Protein Ur, POC: NEGATIVE mg/dL
Specific Gravity, Urine: 1.01
Urobilinogen, Ur: 0.2
pH, UA: 6 (ref 5.0–8.0)

## 2024-01-31 NOTE — Progress Notes (Signed)
 Subjective:   HPI  Shane Valentine is a 58 y.o. male who presents for Chief Complaint  Patient presents with   Annual Exam    Fasting annual exam, no new concerns.     Patient Care Team: Avryl Roehm, Alm GORMAN RIGGERS as PCP - General (Family Medicine) Sees dentist Sees eye doctor Dr. Gordy Bergamo, cardiology Dr. Glendia Holt, GI Dermatology   Concerns: Former smoker, quit last year a year ago  HTN - compliant with medication, amlodipine   Hyperlipidemia - compliant with medication, Crestor  20mg  daily and plavix   GERD - continues on Protonix    Reviewed their medical, surgical, family, social, medication, and allergy history and updated chart as appropriate.  Past Medical History:  Diagnosis Date   Arthritis    Genital warts    GERD (gastroesophageal reflux disease)    Headache    Hyperlipidemia    Hypertension    Paresthesia of hand, bilateral    Peripheral vascular disease (HCC)    Smoker    Stroke [facility])    Tuberculosis 1995   55mo therapy, [place]    Past Surgical History:  Procedure Laterality Date   COLONOSCOPY     INGUINAL HERNIA REPAIR Left 03/29/2014   Procedure: LAPAROSCOPIC LEFT INGUINAL HERNIA REPAIR;  Surgeon: Lynda Leos, MD;  Location: [facility] OR;  Service: General;  Laterality: Left;   INSERTION OF MESH Left 03/29/2014   Procedure: INSERTION OF MESH;  Surgeon: Lynda Leos, MD;  Location: [facility] OR;  Service: General;  Laterality: Left;   INTUBATION-ENDOTRACHEAL WITH TRACHEOSTOMY STANDBY N/A 10/24/2022   Procedure: TISH;  Surgeon: Meliton Elsie CHRISTELLA Mickey., MD;  [facility];  Service: ENT;  Laterality: N/A;   LOWER EXTREMITY ANGIOGRAPHY N/A 08/11/2021   Procedure: LOWER EXTREMITY ANGIOGRAPHY;  Surgeon: Bergamo Gordy, MD;  Location: [facility] INVASIVE CV LAB;  Service: Cardiovascular;  Laterality: N/A;   PERIPHERAL VASCULAR ATHERECTOMY Left 08/11/2021   Procedure: PERIPHERAL VASCULAR ATHERECTOMY;  Surgeon: Bergamo Gordy, MD;  Location: [facility] INVASIVE  CV LAB;  Service: Cardiovascular;  Laterality: Left;   TUMOR EXCISION Bilateral 03/21/2020   Procedure: TUMOR EXCISION RECTAL, anal condyloma;  Surgeon: Lane Shope, MD;  Location: [facility] ORS;  Service: General;  Laterality: Bilateral;   UPPER GASTROINTESTINAL ENDOSCOPY      Social History   Socioeconomic History   Marital status: Single    Spouse name: Not on file   Number of children: 3   Years of education: Not on file   Highest education level: Not on file  Occupational History   Occupation: maintenance  Tobacco Use   Smoking status: Former    Current packs/day: 0.00    Average packs/day: 0.5 packs/day for 23.0 years (11.5 ttl pk-yrs)    Types: Cigarettes    Start date: 10/24/1999    Quit date: 10/24/2022    Years since quitting: 1.2   Smokeless tobacco: Never  Vaping Use   Vaping status: Never Used  Substance and Sexual Activity   Alcohol use: No   Drug use: Yes    Types: Marijuana    Comment: LAST SMOKED 07/28/22   Sexual activity: Never  Other Topics Concern   Not on file  Social History Narrative   Uses jump rope and some dumbbells regularly.   No significant other.   Lives alone.   Works [employer], HVAC, Emergency planning/management officer.  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  12/2019   Social Drivers of Health   Financial Resource Strain: Low Risk  (01/31/2024)   Overall  Financial Resource Strain (CARDIA)    Difficulty of Paying Living Expenses: Not very hard  Food Insecurity: No Food Insecurity (01/31/2024)   Hunger Vital Sign    Worried About Running Out of Food in the Last Year: Never true    Ran Out of Food in the Last Year: Never true  Transportation Needs: No Transportation Needs (01/31/2024)   PRAPARE - Administrator, Civil Service (Medical): No    Lack of Transportation (Non-Medical): No  Physical Activity: Insufficiently Active (01/31/2024)   Exercise Vital Sign    Days of Exercise per Week: 1 day    Minutes of Exercise per Session: 30 min   Stress: Stress Concern Present (01/31/2024)   [employer] of Occupational Health - Occupational Stress Questionnaire    Feeling of Stress: To some extent  Social Connections: Socially Isolated (01/31/2024)   Social Connection and Isolation Panel    Frequency of Communication with Friends and Family: Never    Frequency of Social Gatherings with Friends and Family: Never    Attends Religious Services: Never    Database administrator or Organizations: No    Attends Banker Meetings: Never    Marital Status: Never married  Intimate Partner Violence: Not At Risk (01/31/2024)   Humiliation, Afraid, Rape, and Kick questionnaire    Fear of Current or Ex-Partner: No    Emotionally Abused: No    Physically Abused: No    Sexually Abused: No    Family History  Problem Relation Age of Onset   Diabetes Mother        amputations   Hypertension Mother    Cancer Father    COPD Brother    Cerebral aneurysm Brother    Heart disease Neg Hx    Stroke Neg Hx    Colon cancer Neg Hx    Colon polyps Neg Hx    Crohn's disease Neg Hx    Esophageal cancer Neg Hx    Rectal cancer Neg Hx    Stomach cancer Neg Hx    Ulcerative colitis Neg Hx      Current Outpatient Medications:    amLODipine  (NORVASC ) 10 MG tablet, Take 1 tablet (10 mg total) by mouth daily., Disp: 90 tablet, Rfl: 3   clopidogrel  (PLAVIX ) 75 MG tablet, Take 1 tablet (75 mg total) by mouth daily., Disp: 90 tablet, Rfl: 3   EPINEPHrine  (EPIPEN  2-PAK) 0.3 mg/0.3 mL IJ SOAJ injection, Inject 0.3 mg into the muscle as needed for anaphylaxis., Disp: 1 each, Rfl: 0   pantoprazole  (PROTONIX ) 40 MG tablet, TAKE 1 TABLET(40 MG) BY MOUTH DAILY, Disp: 90 tablet, Rfl: 0   rosuvastatin  (CRESTOR ) 20 MG tablet, Take 1 tablet (20 mg total) by mouth daily., Disp: 90 tablet, Rfl: 3  Allergies  Allergen Reactions   Ace Inhibitors Anaphylaxis    Possible angioedema 10/24/22    Review of Systems  Constitutional:  Negative for chills,  fever, malaise/fatigue and weight loss.  HENT:  Negative for congestion, ear pain, hearing loss, sore throat and tinnitus.   Eyes:  Negative for blurred vision, pain and redness.  Respiratory:  Negative for cough, hemoptysis and shortness of breath.   Cardiovascular:  Negative for chest pain, palpitations, orthopnea, claudication and leg swelling.  Gastrointestinal:  Negative for abdominal pain, blood in stool, constipation, diarrhea, nausea and vomiting.  Genitourinary:  Negative for dysuria, flank pain, frequency, hematuria and urgency.  Musculoskeletal:  Negative for falls, joint pain and myalgias.  Skin:  Negative  for itching and rash.  Neurological:  Negative for dizziness, tingling, speech change, weakness and headaches.  Endo/Heme/Allergies:  Negative for polydipsia. Does not bruise/bleed easily.  Psychiatric/Behavioral:  Negative for depression and memory loss. The patient is not nervous/anxious and does not have insomnia.       Objective:  BP 136/82   Pulse 73   Ht 5' 8 (1.727 m)   Wt 191 lb 6.4 oz (86.8 kg)   SpO2 97%   BMI 29.10 kg/m   Wt Readings from Last 3 Encounters:  01/31/24 191 lb 6.4 oz (86.8 kg)  04/27/23 196 lb (88.9 kg)  12/22/22 189 lb 3.2 oz (85.8 kg)   BP Readings from Last 3 Encounters:  01/31/24 136/82  04/27/23 122/86  12/22/22 (!) 149/100   General appearance: alert, no distress, WD/WN, African-American male Skin: tattoo left upper chest Neck: supple, no lymphadenopathy, no thyromegaly, no masses, normal ROM, no bruits Chest: non tender, normal shape and expansion Heart: RRR, normal S1, S2, no murmurs Lungs: CTA bilaterally, no wheezes, rhonchi, or rales Abdomen: +bs, port surgical scars, soft, non tender, non distended, no masses, no hepatomegaly, no splenomegaly, no bruits Back: some mild bony arthritis changes of hands in general, pain with resisted right shoulder abduction, otherwise nontender, no swelling, no deformity, otherwise non tender,  normal ROM, no scoliosis Musculoskeletal: upper extremities non tender, no obvious deformity, normal ROM throughout, lower extremities non tender, no obvious deformity, normal ROM throughout Extremities: no edema, no cyanosis, no clubbing Pulses: 2+ symmetric, upper and lower extremities, normal cap refill Neurological: alert, oriented x 3, CN2-12 intact, strength normal upper extremities and lower extremities, sensation normal throughout, DTRs 2+ throughout, no cerebellar signs, gait normal Psychiatric: normal affect, behavior normal, pleasant  GU/rectal - declined   Assessment and Plan :   Encounter Diagnoses  Name Primary?   Annual physical exam Yes   Primary hypertension    Vaccine counseling    Screening for prostate cancer    PVD (peripheral vascular disease) (HCC)    Iron deficiency    Hyperlipidemia, unspecified hyperlipidemia type    Barrett's esophagus with dysplasia    Cramping of hands    Impaired fasting blood sugar    Gastroesophageal reflux disease, unspecified whether esophagitis present     Physical exam - discussed and counseled on healthy lifestyle, diet, exercise, preventative care, vaccinations, sick and well care, proper use of emergency dept and after hours care, and addressed their concerns.      Separate significant issues discussed:  Hyperlipidemia - continue rosuvastatin  20mg  daily, Plavix  75mg  daily  HTN - continue Amlodipine  10mg  daily  Former smoker - thankfully he quit last year.  GERD - continue protonix  40mg  daily  Hx/o iron deficiency - updated labs today.  Not currently on iron  Impaired glucose - updated labs today  Peripheral vascular disease - continue with exercise, glad you quit smoking  Cramping - increase water intake, updated labs today   General Recommendations: Continue to return yearly for your annual wellness and preventative care visits.  This gives us  a chance to discuss healthy lifestyle, exercise, vaccinations,  review your chart record, and perform screenings where appropriate.  I recommend you see your eye doctor yearly for routine vision care.  I recommend you see your dentist yearly for routine dental care including hygiene visits twice yearly.   Vaccination  Immunization History  Administered Date(s) Administered   PFIZER(Purple Top)SARS-COV-2 Vaccination 11/01/2019, 11/22/2019   Pneumococcal Polysaccharide-23 11/30/2017   Tdap 11/30/2017  Vaccine recommendations: Shingrix, prenvar, yearly flu shot  Vaccines administered today: None, declines   Screening for cancer: Colon cancer screening: Prior or last colon cancer screen: 08/2022, due repeat 7 years for colonoscopy, EGD due repeat now.   Prostate Cancer screening: The recommended prostate cancer screening test is a blood test called the prostate-specific antigen (PSA) test. PSA is a protein that is made in the prostate. As you age, your prostate naturally produces more PSA. Abnormally high PSA levels may be caused by: Prostate cancer. An enlarged prostate that is not caused by cancer (benign prostatic hyperplasia, or BPH). This condition is very common in older men. A prostate gland infection (prostatitis) or urinary tract infection. Certain medicines such as male hormones (like testosterone ) or other medicines that raise testosterone  levels. A rectal exam may be done as part of prostate cancer screening to help provide information about the size of your prostate gland. When a rectal exam is performed, it should be done after the PSA level is drawn to avoid any effect on the results.   Skin cancer screening: Check your skin regularly for new changes, growing lesions, or other lesions of concern Come in for evaluation if you have skin lesions of concern.   Lung cancer screening: If you have a greater than 20 pack year history of tobacco use, then you may qualify for lung cancer screening with a chest CT scan.   Please call  your insurance company to inquire about coverage for this test.   Pancreatic cancer:  no current screening test is available or routinely recommended. (risk factors: smoking, overweight or obese, diabetes, chronic pancreatitis, work exposure - dry cleaning, metal working, 58yo>, M>F, Tree surgeon, family hx/o, hereditary breast, ovarian, melanoma, lynch, peutz-jeghers).  Symptoms: jaundice, dark urine, light color or greasy stools, itchy skin, belly or back pain, weight loss, poor appetite, nausea, vomiting, liver enlargement, DVT/blood clots.   We currently don't have screenings for other cancers besides breast, cervical, colon, and lung cancers.  If you have a strong family history of cancer or have other cancer screening concerns, please let me know.  Genetic testing referral is an option for individuals with high cancer risk in the family.  There are some other cancer screenings in development currently.   Bone health: Get at least 150 minutes of aerobic exercise weekly Get weight bearing exercise at least once weekly Bone density test:  A bone density test is an imaging test that uses a type of X-ray to measure the amount of calcium  and other minerals in your bones. The test may be used to diagnose or screen you for a condition that causes weak or thin bones (osteoporosis), predict your risk for a broken bone (fracture), or determine how well your osteoporosis treatment is working. The bone density test is recommended for females 65 and older, or females or males <65 if certain risk factors such as thyroid disease, long term use of steroids such as for asthma or rheumatological issues, vitamin D  deficiency, estrogen deficiency, family history of osteoporosis, self or family history of fragility fracture in first degree relative.    Heart health: Get at least 150 minutes of aerobic exercise weekly Limit alcohol It is important to maintain a healthy blood pressure and healthy  cholesterol numbers  Heart disease screening: Screening for heart disease includes screening for blood pressure, fasting lipids, glucose/diabetes screening, BMI height to weight ratio, reviewed of smoking status, physical activity, and diet.    Goals include blood pressure 120/80 or  less, maintaining a healthy lipid/cholesterol profile, preventing diabetes or keeping diabetes numbers under good control, not smoking or using tobacco products, exercising most days per week or at least 150 minutes per week of exercise, and eating healthy variety of fruits and vegetables, healthy oils, and avoiding unhealthy food choices like fried food, fast food, high sugar and high cholesterol foods.    Other tests may possibly include EKG test, CT coronary calcium  score, echocardiogram, exercise treadmill stress test.     Heart Disease Testing completed: Echocardiogram 07/2022 reviewed.   Carotid US  reviewed from 05/2021.  Discussed repeating carotid US  in 2-3 years.   Vascular disease screening: For higher risk individuals including smokers, diabetics, patients with known heart disease or high blood pressure, kidney disease, and others, screening for vascular disease or atherosclerosis of the arteries is available.  Examples may include carotid ultrasound, abdominal aortic ultrasound, ABI blood flow screening in the legs, thoracic aorta screening.  ABI normal 07/2022.   Medical care options: I recommend you continue to seek care here first for routine care.  We try really hard to have available appointments Monday through Friday daytime hours for sick visits, acute visits, and physicals.  Urgent care should be used for after hours and weekends for significant issues that cannot wait till the next day.  The emergency department should be used for significant potentially life-threatening emergencies.  The emergency department is expensive, can often have long wait times for less significant concerns, so try to  utilize primary care, urgent care, or telemedicine when possible to avoid unnecessary trips to the emergency department.  Virtual visits and telemedicine have been introduced since the pandemic started in 2020, and can be convenient ways to receive medical care.  We offer virtual appointments as well to assist you in a variety of options to seek medical care.   Legal  Take the time to do a last will and testament, Advanced Directives including Health Care Power of Attorney and Living Will documents.  Don't leave your family with burdens that can be handled ahead of time.   Advanced Directives: I recommend you consider completing a Health Care Power of Attorney and Living Will.   These documents respect your wishes and help alleviate burdens on your loved ones if you were to become terminally ill or be in a position to need those documents enforced.    You can complete Advanced Directives yourself, have them notarized, then have copies made for our office, for you and for anybody you feel should have them in safe keeping.  Or, you can have an attorney prepare these documents.   If you haven't updated your Last Will and Testament in a while, it may be worthwhile having an attorney prepare these documents together and save on some costs.         Fahad was seen today for annual exam.  Diagnoses and all orders for this visit:  Annual physical exam -     POCT Urinalysis DIP (Proadvantage Device) -     Comprehensive metabolic panel with GFR -     CBC with Differential/Platelet -     Lipid panel -     TSH -     PSA -     Hemoglobin A1c -     Iron, TIBC and Ferritin Panel -     VITAMIN D  25 Hydroxy (Vit-D Deficiency, Fractures) -     Phosphorus  Primary hypertension  Vaccine counseling  Screening for prostate cancer -  PSA  PVD (peripheral vascular disease) (HCC)  Iron deficiency -     CBC with Differential/Platelet -     Iron, TIBC and Ferritin Panel  Hyperlipidemia,  unspecified hyperlipidemia type -     Lipid panel  Barrett's esophagus with dysplasia  Cramping of hands -     Comprehensive metabolic panel with GFR -     Phosphorus  Impaired fasting blood sugar -     Hemoglobin A1c  Gastroesophageal reflux disease, unspecified whether esophagitis present    Follow-up pending labs, yearly for physical

## 2024-02-01 ENCOUNTER — Ambulatory Visit: Payer: Self-pay | Admitting: Medical

## 2024-02-01 LAB — CBC WITH DIFFERENTIAL/PLATELET
Basophils Absolute: 0 x10E3/uL (ref 0.0–0.2)
Basos: 1 %
EOS (ABSOLUTE): 0.1 x10E3/uL (ref 0.0–0.4)
Eos: 2 %
Hematocrit: 40.9 % (ref 37.5–51.0)
Hemoglobin: 13.3 g/dL (ref 13.0–17.7)
Immature Grans (Abs): 0 x10E3/uL (ref 0.0–0.1)
Immature Granulocytes: 0 %
Lymphocytes Absolute: 1.3 x10E3/uL (ref 0.7–3.1)
Lymphs: 24 %
MCH: 31.8 pg (ref 26.6–33.0)
MCHC: 32.5 g/dL (ref 31.5–35.7)
MCV: 98 fL — ABNORMAL HIGH (ref 79–97)
Monocytes Absolute: 0.3 x10E3/uL (ref 0.1–0.9)
Monocytes: 6 %
Neutrophils Absolute: 3.8 x10E3/uL (ref 1.4–7.0)
Neutrophils: 67 %
Platelets: 84 x10E3/uL — CL (ref 150–450)
RBC: 4.18 x10E6/uL (ref 4.14–5.80)
RDW: 12.7 % (ref 11.6–15.4)
WBC: 5.6 x10E3/uL (ref 3.4–10.8)

## 2024-02-01 LAB — LIPID PANEL
Chol/HDL Ratio: 2.6 ratio (ref 0.0–5.0)
Cholesterol, Total: 150 mg/dL (ref 100–199)
HDL: 57 mg/dL (ref >39)
LDL Chol Calc (NIH): 82 mg/dL (ref 0–99)
Triglycerides: 52 mg/dL (ref 0–149)
VLDL Cholesterol Cal: 11 mg/dL (ref 5–40)

## 2024-02-01 LAB — COMPREHENSIVE METABOLIC PANEL WITH GFR
ALT: 24 IU/L (ref 0–44)
AST: 22 IU/L (ref 0–40)
Albumin: 4.6 g/dL (ref 3.8–4.9)
Alkaline Phosphatase: 98 IU/L (ref 44–121)
BUN/Creatinine Ratio: 14 (ref 9–20)
BUN: 14 mg/dL (ref 6–24)
Bilirubin Total: 0.4 mg/dL (ref 0.0–1.2)
CO2: 22 mmol/L (ref 20–29)
Calcium: 9.5 mg/dL (ref 8.7–10.2)
Chloride: 105 mmol/L (ref 96–106)
Creatinine, Ser: 1 mg/dL (ref 0.76–1.27)
Globulin, Total: 2 g/dL (ref 1.5–4.5)
Glucose: 103 mg/dL — ABNORMAL HIGH (ref 70–99)
Potassium: 4.2 mmol/L (ref 3.5–5.2)
Sodium: 141 mmol/L (ref 134–144)
Total Protein: 6.6 g/dL (ref 6.0–8.5)
eGFR: 87 mL/min/1.73 (ref >59)

## 2024-02-01 LAB — HEMOGLOBIN A1C
Est. average glucose Bld gHb Est-mCnc: 114 mg/dL
Hgb A1c MFr Bld: 5.6 % (ref 4.8–5.6)

## 2024-02-01 LAB — TSH: TSH: 0.734 u[IU]/mL (ref 0.450–4.500)

## 2024-02-01 LAB — IRON,TIBC AND FERRITIN PANEL
Ferritin: 249 ng/mL (ref 30–400)
Iron Saturation: 19 % (ref 15–55)
Iron: 51 ug/dL (ref 38–169)
Total Iron Binding Capacity: 265 ug/dL (ref 250–450)
UIBC: 214 ug/dL (ref 111–343)

## 2024-02-01 LAB — PSA: Prostate Specific Ag, Serum: 0.6 ng/mL (ref 0.0–4.0)

## 2024-02-01 LAB — PHOSPHORUS: Phosphorus: 3 mg/dL (ref 2.8–4.1)

## 2024-02-01 LAB — VITAMIN D 25 HYDROXY (VIT D DEFICIENCY, FRACTURES): Vit D, 25-Hydroxy: 22.5 ng/mL — ABNORMAL LOW (ref 30.0–100.0)

## 2024-02-02 ENCOUNTER — Other Ambulatory Visit: Payer: Self-pay | Admitting: Medical

## 2024-02-02 DIAGNOSIS — I10 Essential (primary) hypertension: Secondary | ICD-10-CM

## 2024-02-02 DIAGNOSIS — D696 Thrombocytopenia, unspecified: Secondary | ICD-10-CM

## 2024-02-02 MED ORDER — EPINEPHRINE 0.3 MG/0.3ML IJ SOAJ: 0.3000 mg | INTRAMUSCULAR | 0 refills | Status: AC | PRN

## 2024-02-02 MED ORDER — VITAMIN D (ERGOCALCIFEROL) 1.25 MG (50000 UNIT) PO CAPS: 50000.0000 [IU] | ORAL_CAPSULE | ORAL | 3 refills | Status: AC

## 2024-02-02 MED ORDER — ROSUVASTATIN CALCIUM 20 MG PO TABS: 20.0000 mg | ORAL_TABLET | Freq: Every day | ORAL | 3 refills | Status: DC

## 2024-02-02 MED ORDER — CLOPIDOGREL BISULFATE 75 MG PO TABS: 75.0000 mg | ORAL_TABLET | Freq: Every day | ORAL | 3 refills | Status: AC

## 2024-02-02 MED ORDER — AMLODIPINE BESYLATE 10 MG PO TABS: 10.0000 mg | ORAL_TABLET | Freq: Every day | ORAL | 3 refills | Status: DC

## 2024-02-02 NOTE — Progress Notes (Signed)
 Results sent through MyChart  Get him on the schedule for repeat nurse visit in 2 to 3 weeks for labs

## 2024-02-08 ENCOUNTER — Other Ambulatory Visit: Payer: Self-pay | Admitting: Medical

## 2024-02-08 NOTE — Telephone Encounter (Signed)
 This was taking off med list at his recent visit

## 2024-02-21 ENCOUNTER — Other Ambulatory Visit

## 2024-02-23 ENCOUNTER — Other Ambulatory Visit

## 2024-02-24 ENCOUNTER — Other Ambulatory Visit

## 2024-02-24 DIAGNOSIS — I10 Essential (primary) hypertension: Secondary | ICD-10-CM

## 2024-02-24 DIAGNOSIS — D696 Thrombocytopenia, unspecified: Secondary | ICD-10-CM | POA: Diagnosis not present

## 2024-02-24 LAB — CBC
Hematocrit: 42.1 % (ref 37.5–51.0)
Hemoglobin: 13.3 g/dL (ref 13.0–17.7)
MCH: 30.9 pg (ref 26.6–33.0)
MCHC: 31.6 g/dL (ref 31.5–35.7)
MCV: 98 fL — ABNORMAL HIGH (ref 79–97)
RBC: 4.31 x10E6/uL (ref 4.14–5.80)
RDW: 12.9 % (ref 11.6–15.4)
WBC: 6.2 x10E3/uL (ref 3.4–10.8)

## 2024-02-27 ENCOUNTER — Ambulatory Visit: Payer: Self-pay | Admitting: Medical

## 2024-02-27 NOTE — Progress Notes (Signed)
 Lets continue to monitor your blood counts.   Lets recheck you level again in 2-3 months

## 2024-04-30 ENCOUNTER — Other Ambulatory Visit: Payer: Self-pay | Admitting: Medical

## 2024-05-01 ENCOUNTER — Other Ambulatory Visit: Payer: Self-pay | Admitting: Medical

## 2024-05-01 DIAGNOSIS — I10 Essential (primary) hypertension: Secondary | ICD-10-CM

## 2024-05-01 NOTE — Telephone Encounter (Signed)
 Appt 01/2025

## 2024-07-30 ENCOUNTER — Other Ambulatory Visit: Payer: Self-pay | Admitting: Medical

## 2025-02-07 ENCOUNTER — Encounter: Payer: Self-pay | Admitting: Medical
